# Patient Record
Sex: Female | Born: 1955 | Race: White | Hispanic: No | Marital: Married | Smoking: Current every day smoker
Health system: Southern US, Community
[De-identification: ages and names within clinical notes are randomized; demographics above are authoritative.]

## PROBLEM LIST (undated history)

## (undated) DIAGNOSIS — M722 Plantar fascial fibromatosis: Secondary | ICD-10-CM

## (undated) DIAGNOSIS — R519 Headache, unspecified: Secondary | ICD-10-CM

## (undated) DIAGNOSIS — F419 Anxiety disorder, unspecified: Secondary | ICD-10-CM

## (undated) DIAGNOSIS — R51 Headache: Secondary | ICD-10-CM

## (undated) DIAGNOSIS — M76899 Other specified enthesopathies of unspecified lower limb, excluding foot: Secondary | ICD-10-CM

## (undated) DIAGNOSIS — R002 Palpitations: Secondary | ICD-10-CM

## (undated) DIAGNOSIS — E119 Type 2 diabetes mellitus without complications: Secondary | ICD-10-CM

## (undated) DIAGNOSIS — I2510 Atherosclerotic heart disease of native coronary artery without angina pectoris: Secondary | ICD-10-CM

## (undated) DIAGNOSIS — F17200 Nicotine dependence, unspecified, uncomplicated: Secondary | ICD-10-CM

## (undated) DIAGNOSIS — M1990 Unspecified osteoarthritis, unspecified site: Secondary | ICD-10-CM

## (undated) DIAGNOSIS — Z8673 Personal history of transient ischemic attack (TIA), and cerebral infarction without residual deficits: Secondary | ICD-10-CM

## (undated) DIAGNOSIS — I639 Cerebral infarction, unspecified: Secondary | ICD-10-CM

## (undated) DIAGNOSIS — E785 Hyperlipidemia, unspecified: Secondary | ICD-10-CM

## (undated) DIAGNOSIS — G4700 Insomnia, unspecified: Secondary | ICD-10-CM

## (undated) DIAGNOSIS — I480 Paroxysmal atrial fibrillation: Secondary | ICD-10-CM

## (undated) DIAGNOSIS — I10 Essential (primary) hypertension: Secondary | ICD-10-CM

## (undated) DIAGNOSIS — J45909 Unspecified asthma, uncomplicated: Secondary | ICD-10-CM

## (undated) DIAGNOSIS — J449 Chronic obstructive pulmonary disease, unspecified: Secondary | ICD-10-CM

## (undated) DIAGNOSIS — I509 Heart failure, unspecified: Secondary | ICD-10-CM

## (undated) DIAGNOSIS — D1739 Benign lipomatous neoplasm of skin and subcutaneous tissue of other sites: Secondary | ICD-10-CM

## (undated) DIAGNOSIS — R809 Proteinuria, unspecified: Secondary | ICD-10-CM

## (undated) DIAGNOSIS — I6529 Occlusion and stenosis of unspecified carotid artery: Secondary | ICD-10-CM

## (undated) DIAGNOSIS — G4730 Sleep apnea, unspecified: Secondary | ICD-10-CM

## (undated) DIAGNOSIS — D72829 Elevated white blood cell count, unspecified: Secondary | ICD-10-CM

## (undated) DIAGNOSIS — H9319 Tinnitus, unspecified ear: Secondary | ICD-10-CM

## (undated) HISTORY — DX: Insomnia, unspecified: G47.00

## (undated) HISTORY — DX: Other specified enthesopathies of unspecified lower limb, excluding foot: M76.899

## (undated) HISTORY — DX: Occlusion and stenosis of unspecified carotid artery: I65.29

## (undated) HISTORY — DX: Proteinuria, unspecified: R80.9

## (undated) HISTORY — PX: APPENDECTOMY: SHX54

## (undated) HISTORY — DX: Hyperlipidemia, unspecified: E78.5

## (undated) HISTORY — DX: Elevated white blood cell count, unspecified: D72.829

## (undated) HISTORY — DX: Atherosclerotic heart disease of native coronary artery without angina pectoris: I25.10

## (undated) HISTORY — DX: Unspecified osteoarthritis, unspecified site: M19.90

## (undated) HISTORY — DX: Chronic obstructive pulmonary disease, unspecified: J44.9

## (undated) HISTORY — DX: Paroxysmal atrial fibrillation: I48.0

## (undated) HISTORY — DX: Essential (primary) hypertension: I10

## (undated) HISTORY — DX: Personal history of transient ischemic attack (TIA), and cerebral infarction without residual deficits: Z86.73

## (undated) HISTORY — DX: Anxiety disorder, unspecified: F41.9

## (undated) HISTORY — DX: Sleep apnea, unspecified: G47.30

## (undated) HISTORY — PX: OTHER SURGICAL HISTORY: SHX169

## (undated) HISTORY — PX: ABDOMINAL HYSTERECTOMY: SHX81

## (undated) HISTORY — DX: Palpitations: R00.2

## (undated) HISTORY — PX: MOLE REMOVAL: SHX2046

## (undated) HISTORY — DX: Benign lipomatous neoplasm of skin and subcutaneous tissue of other sites: D17.39

## (undated) HISTORY — DX: Type 2 diabetes mellitus without complications: E11.9

## (undated) HISTORY — DX: Nicotine dependence, unspecified, uncomplicated: F17.200

## (undated) HISTORY — DX: Plantar fascial fibromatosis: M72.2

## (undated) HISTORY — DX: Tinnitus, unspecified ear: H93.19

---

## 2004-11-08 ENCOUNTER — Emergency Department: Payer: Self-pay | Admitting: Emergency Medicine

## 2004-11-08 ENCOUNTER — Other Ambulatory Visit: Payer: Self-pay

## 2006-03-02 ENCOUNTER — Ambulatory Visit: Payer: Self-pay | Admitting: Internal Medicine

## 2006-05-22 ENCOUNTER — Ambulatory Visit: Payer: Self-pay | Admitting: Internal Medicine

## 2006-10-07 ENCOUNTER — Emergency Department: Payer: Self-pay | Admitting: Emergency Medicine

## 2007-04-24 HISTORY — PX: CARDIAC CATHETERIZATION: SHX172

## 2007-07-16 ENCOUNTER — Ambulatory Visit: Payer: Self-pay | Admitting: Internal Medicine

## 2007-09-28 ENCOUNTER — Inpatient Hospital Stay: Payer: Self-pay | Admitting: Cardiology

## 2007-09-28 ENCOUNTER — Other Ambulatory Visit: Payer: Self-pay

## 2007-09-30 ENCOUNTER — Other Ambulatory Visit: Payer: Self-pay

## 2007-11-03 ENCOUNTER — Emergency Department: Payer: Self-pay | Admitting: Emergency Medicine

## 2007-11-03 ENCOUNTER — Other Ambulatory Visit: Payer: Self-pay

## 2007-11-19 ENCOUNTER — Encounter: Payer: Self-pay | Admitting: Internal Medicine

## 2008-02-16 ENCOUNTER — Encounter: Payer: Self-pay | Admitting: Cardiovascular Disease

## 2008-12-31 ENCOUNTER — Emergency Department: Payer: Self-pay | Admitting: Emergency Medicine

## 2009-01-11 ENCOUNTER — Encounter: Payer: Self-pay | Admitting: Cardiovascular Disease

## 2009-04-30 ENCOUNTER — Inpatient Hospital Stay: Payer: Self-pay | Admitting: *Deleted

## 2009-04-30 ENCOUNTER — Encounter: Payer: Self-pay | Admitting: Cardiovascular Disease

## 2009-05-09 ENCOUNTER — Encounter: Payer: Self-pay | Admitting: Family Medicine

## 2009-05-23 ENCOUNTER — Ambulatory Visit: Payer: Self-pay | Admitting: Family Medicine

## 2009-05-24 ENCOUNTER — Encounter: Payer: Self-pay | Admitting: Family Medicine

## 2009-06-23 ENCOUNTER — Ambulatory Visit: Payer: Self-pay | Admitting: Family Medicine

## 2009-07-04 ENCOUNTER — Ambulatory Visit: Payer: Self-pay | Admitting: Family Medicine

## 2009-07-24 ENCOUNTER — Emergency Department: Payer: Self-pay | Admitting: Internal Medicine

## 2009-07-28 ENCOUNTER — Encounter: Payer: Self-pay | Admitting: Cardiovascular Disease

## 2009-07-28 LAB — CONVERTED CEMR LAB
ALT: 18 U/L
AST: 17 U/L
Albumin: 4.2 g/dL
Alkaline Phosphatase: 97 U/L
BUN: 9 mg/dL
Basophils Relative: 1 %
CO2: 22 meq/L
Calcium: 9.4 mg/dL
Chloride: 102 meq/L
Cholesterol: 146 mg/dL
Creatinine, Ser: 0.84 mg/dL
Eosinophils Relative: 2 %
Glucose, Bld: 88 mg/dL
HCT: 47 %
HDL: 36 mg/dL
Hemoglobin: 16.4 g/dL
Hgb A1c MFr Bld: 6 %
LDL (calc): 82 mg/dL
Lymphocytes, automated: 38 %
MCV: 98 fL
Monocytes Relative: 6 %
Neutrophils Relative %: 53 %
Platelets: 222 K/uL
Potassium: 4 meq/L
RBC: 4.81 M/uL
RDW: 13 %
Sodium: 140 meq/L
Total Bilirubin: 0.3 mg/dL
Total Protein: 7.4 g/dL
WBC: 12.2 10*3/microliter

## 2009-08-16 ENCOUNTER — Encounter: Payer: Self-pay | Admitting: Cardiovascular Disease

## 2009-08-19 ENCOUNTER — Ambulatory Visit: Payer: Self-pay | Admitting: Podiatrist

## 2009-10-12 ENCOUNTER — Encounter: Payer: Self-pay | Admitting: Cardiovascular Disease

## 2009-10-19 ENCOUNTER — Encounter: Payer: Self-pay | Admitting: Cardiovascular Disease

## 2009-10-25 ENCOUNTER — Encounter: Payer: Self-pay | Admitting: Cardiovascular Disease

## 2009-10-28 ENCOUNTER — Ambulatory Visit: Payer: Self-pay | Admitting: Cardiovascular Disease

## 2009-10-28 DIAGNOSIS — R002 Palpitations: Secondary | ICD-10-CM | POA: Insufficient documentation

## 2009-10-28 DIAGNOSIS — I209 Angina pectoris, unspecified: Secondary | ICD-10-CM | POA: Insufficient documentation

## 2009-10-28 DIAGNOSIS — I2510 Atherosclerotic heart disease of native coronary artery without angina pectoris: Secondary | ICD-10-CM | POA: Insufficient documentation

## 2009-10-28 DIAGNOSIS — R0602 Shortness of breath: Secondary | ICD-10-CM | POA: Insufficient documentation

## 2009-10-28 DIAGNOSIS — E785 Hyperlipidemia, unspecified: Secondary | ICD-10-CM | POA: Insufficient documentation

## 2009-11-02 ENCOUNTER — Telehealth: Payer: Self-pay | Admitting: Cardiovascular Disease

## 2009-11-09 ENCOUNTER — Encounter: Payer: Self-pay | Admitting: Cardiovascular Disease

## 2009-12-02 ENCOUNTER — Telehealth: Payer: Self-pay | Admitting: Cardiovascular Disease

## 2009-12-27 ENCOUNTER — Ambulatory Visit: Payer: Self-pay

## 2010-01-02 ENCOUNTER — Inpatient Hospital Stay: Payer: Self-pay | Admitting: Internal Medicine

## 2010-01-02 ENCOUNTER — Telehealth: Payer: Self-pay | Admitting: Cardiovascular Disease

## 2010-01-02 ENCOUNTER — Ambulatory Visit: Payer: Self-pay | Admitting: Cardiovascular Disease

## 2010-01-03 ENCOUNTER — Encounter: Payer: Self-pay | Admitting: Cardiovascular Disease

## 2010-02-06 ENCOUNTER — Telehealth: Payer: Self-pay | Admitting: Cardiovascular Disease

## 2010-02-24 ENCOUNTER — Encounter: Payer: Self-pay | Admitting: Cardiovascular Disease

## 2010-03-13 ENCOUNTER — Ambulatory Visit: Payer: Self-pay | Admitting: Cardiovascular Disease

## 2010-03-14 ENCOUNTER — Encounter: Payer: Self-pay | Admitting: Cardiovascular Disease

## 2010-03-14 ENCOUNTER — Ambulatory Visit: Payer: Self-pay

## 2010-05-23 NOTE — Letter (Signed)
Summary: [place] Medicaid Preferred Drug List  [place] Medicaid Preferred Drug List   Imported By: Marylou Mccoy 03/21/2010 15:19:48  _____________________________________________________________________  External Attachment:    Type:   Image     Comment:   External Document

## 2010-05-23 NOTE — Medication Information (Signed)
Summary: Tax adviser   Imported By: Harlon Flor 03/08/2010 12:14:48  _____________________________________________________________________  External Attachment:    Type:   Image     Comment:   External Document

## 2010-05-23 NOTE — Progress Notes (Signed)
Summary: Medication [facility])  Phone Note Call from Patient Call back at [facility] Phone [phone]   Caller: Patient Call For: [facility] Summary of Call: Patient called about her medication that she started after her visit and she is stil having headaches and naseuated.  Would like to know how long it will take for these side effects to subside. Initial call taken by: [place] Car[place],  November 02, 2009 10:38 AM  Follow-up for Phone Call        I called and spoke with the pt. She states that starting saturday, she developed breathing problems and started on oxygen. She did go ahead and drop her bisoprolol to 10mg  once daily after that. She has developed nausea and headaches. She has drunk quite a bit of fluid today. I explained to the pt that she may be dehydrated. I asked if she were diabetic and she states she is not. Per Dr. Mariah Milling, the pt may hold her bisoprolol for a couple of days to see if her symptoms resolve. I will call her back on friday to f/u with how she is doing. I explained if her symptoms get worse off of the bisoprolol, she should contact her PCP. She is agreeable. Follow-up by: Sherri Rad, RN, BSN,  November 02, 2009 12:19 PM  Additional Follow-up for Phone Call Additional follow up Details #1::        [facility]. Sherri Rad, RN, BSN  November 04, 2009 12:15 PM   Pt resports no further s/s since d/c bisoprolol. please advise. Benedict Needy, RN  November 08, 2009 9:41 AM     Additional Follow-up for Phone Call Additional follow up Details #2::    If she feels better and still has palpitations, could try some bystolic 5 mg daily. Come by for samples. Hold metoprolol if on metoprolol. May need bystolic 10 mg if 5 mg not enough.   Appended Document: Medication [facility]) spoke to pt about medication change will leave samples of bystolic at front desk

## 2010-05-23 NOTE — Miscellaneous (Signed)
Summary: Bystolic samples  Clinical Lists Changes  Medications: Removed medication of BISOPROLOL FUMARATE 10 MG TABS (BISOPROLOL FUMARATE) Take 2 tablet by mouth once a day Added new medication of BYSTOLIC 5 MG TABS (NEBIVOLOL HCL) Take 1 tablet by mouth once a day - Signed Rx of BYSTOLIC 5 MG TABS (NEBIVOLOL HCL) Take 1 tablet by mouth once a day;  #28 x 0;  Signed;  Entered by: Benedict Needy, RN;  Authorized by: Dossie Arbour MD;  Method used: Samples Given    Prescriptions: BYSTOLIC 5 MG TABS (NEBIVOLOL HCL) Take 1 tablet by mouth once a day  #28 x 0   Entered by:   Benedict Needy, RN   Authorized by:   Dossie Arbour MD   Signed by:   Benedict Needy, RN on 11/09/2009   Method used:   Samples Given   RxID:   [id number]

## 2010-05-23 NOTE — Cardiovascular Report (Signed)
Summary: Cardiac Cath Other  Cardiac Cath Other   Imported By: [place] Carbo 10/28/2009 12:03:16  _____________________________________________________________________  External Attachment:    Type:   Image     Comment:   External Document

## 2010-05-23 NOTE — Progress Notes (Signed)
Summary: FYI-ED  Phone Note Call from Patient Call back at Home Phone [phone]   Caller: Daughter Call For: [facility] Summary of Call: WANTED TO INFORM us THAT PT IS HAVING CHEST PAIN AND IS GOING TO THE ED Initial call taken by: Harlon Flor,  January 02, 2010 9:10 AM

## 2010-05-23 NOTE — Letter (Signed)
[facility]   Imported By: Park Breed 10/31/2009 08:50:31  _____________________________________________________________________  External Attachment:    Type:   Image     Comment:   External Document

## 2010-05-23 NOTE — Progress Notes (Signed)
Summary: SAMPLES  Phone Note Refill Request Call back at Home Phone [phone] Message from:  Patient on February 06, 2010 11:30 AM  Refills Requested: Medication #1:  BYSTOLIC 5 MG TABS Take 1 tablet by mouth once a day. PT WOULD LIKE SAMPLES OF BYSTOLLIC  Initial call taken by: Harlon Flor,  February 06, 2010 11:30 AM  Follow-up for Phone Call        Samples given of Bystolic 5mg  x 2 month supply. Follow-up by: Bishop Dublin, CMA,  February 06, 2010 3:17 PM

## 2010-05-23 NOTE — Assessment & Plan Note (Signed)
Summary: F/U [facility]   Visit Type:  Follow-up Primary Provider:  Anastasia Pall.  CC:  F/U [facility].  Has chest discomfort when lying down and is short of breath during the day.Marland Kitchen  History of Present Illness: 55 year old woman with a long history of smoking for the past 30 years, per her report, history of TIAs, underlying coronary artery disease with a PCI to her proximal RCA in June of 2009 with ejection fraction at that time of 60% with no disease mentioned to her left coronary system, diabetes, COPD, insomnia, chronic low back pain who presents for routine follow up.  She reports that the palpitations are better on the bystolic. She does have them at times.  She still has some chest pain. this comes on in bed when lying o her side, sometimes with mopping and sometimes with exertion, walking.   She was recently seen in the hospital for chest pain 01/02/2010. Stress test was performed that showed no signiificant ischemia.   Chronis insomnia is still a problem.  EKG shows normal sinus rhythm with rate 69 beats per minute, no significant ST or T wave changes.  Current Medications (verified): 1)  Aspirin 81 Mg Tbec (Aspirin) .Marland Kitchen.. 1 Once Daily 2)  Plavix 75 Mg Tabs (Clopidogrel Bisulfate) .Marland Kitchen.. 1 Tab Once Daily 3)  Crestor 20 Mg Tabs (Rosuvastatin Calcium) .... One Tablet At Bedtime 4)  Xanax 0.5 Mg Tabs (Alprazolam) .Marland Kitchen.. 1 Once Daily 5)  Nitrostat 0.4 Mg Subl (Nitroglycerin) .... As Needed 6)  Bystolic 5 Mg Tabs (Nebivolol Hcl) .... Take 1 Tablet By Mouth Once A Day 7)  Albuterol Sulfate (2.5 Mg/108ml) 0.083% Nebu (Albuterol Sulfate) .... As Needed  Allergies (verified): 1)  ! Augmentin 2)  ! * Cefzil 3)  ! * Cheese Flavor 4)  ! * Cherry 5)  ! * Egg 6)  ! * Nexium 7)  ! Penicillin 8)  ! Prednisone 9)  ! Prilosec 10)  ! * Propulsid 11)  ! * Singulair 12)  ! * Tequin 13)  ! Zithromax  Past History:  Past Medical History: Last updated: 10/28/2009 COPD   anxiety palpitation TIA carotid stenosis CAD dyslipidemia  Past Surgical History: Last updated: 10/25/2009 thoractomy hysterectomy appendectomy stent placement   Family History: Last updated: 10/25/2009 Family History of Coronary Artery Disease:  Family History of CVA or Stroke:  Family History of Diabetes:   Social History: Last updated: 10/25/2009 Disabled  Divorced  Tobacco Use - Yes. 1/2 ppd Alcohol Use - no Regular Exercise - no Drug Use - no  Risk Factors: Exercise: no (10/25/2009)  Risk Factors: Smoking Status: current (10/25/2009)  Review of Systems       The patient complains of chest pain.  The patient denies fever, vision loss, decreased hearing, hoarseness, syncope, dyspnea on exertion, peripheral edema, prolonged cough, abdominal pain, incontinence, muscle weakness, depression, and enlarged lymph nodes.    Vital Signs:  Patient profile:   55 year old female Height:      60 inches Weight:      203 pounds BMI:     39.79 Pulse rate:   69 / minute BP sitting:   110 / 68  (left arm) Cuff size:   large  Vitals Entered By: Bishop Dublin, CMA (March 13, 2010 10:49 AM)  Physical Exam  General:  Well developed, well nourished, in no acute distress.she smells of smoke. Head:  normocephalic and atraumatic Neck:  Neck supple, no JVD. No masses, thyromegaly or abnormal cervical nodes. Lungs:  Clear bilaterally to auscultation and percussion. Heart:  Non-displaced PMI, chest non-tender; regular rate and rhythm, S1, S2 without murmurs, rubs or gallops. Carotid upstroke normal, no bruit.  Pedals normal pulses. No edema, no varicosities. Abdomen:  Bowel sounds positive; abdomen soft and non-tender without masses Msk:  Back normal, normal gait. Muscle strength and tone normal. Pulses:  pulses normal in all 4 extremities Extremities:  No clubbing or cyanosis. Neurologic:  Alert and oriented x 3. Skin:  Intact without lesions or rashes. Psych:  Normal  affect.   Impression & Recommendations:  Problem # 1:  CHEST PAIN UNSPECIFIED (ICD-786.50) we have offered her cardiac catheterization, she prefers medical management at this time. We will start her on Imdur 15 mg daily, titrating up to 30 mg daily if tolerated. Alternatively, we could try ranexa for her symptoms. A vascular contact me if she has continued symptoms on long-acting nitrates  Her updated medication list for this problem includes:    Aspirin 81 Mg Tbec (Aspirin) .Marland Kitchen... 1 once daily    Plavix 75 Mg Tabs (Clopidogrel bisulfate) .Marland Kitchen... 1 tab once daily    Nitrostat 0.4 Mg Subl (Nitroglycerin) .Marland Kitchen... As needed    Bystolic 5 Mg Tabs (Nebivolol hcl) .Marland Kitchen... Take 1 tablet by mouth once a day    Isosorbide Mononitrate Cr 30 Mg Xr24h-tab (Isosorbide mononitrate) .Marland Kitchen... Take one tablet by mouth daily  Orders: EKG w/ Interpretation (93000)  Problem # 2:  PALPITATIONS (ICD-785.1) Symptoms are better on low dose beta blocker. She can take an additional dose p.r.n. for severe symptoms.  Her updated medication list for this problem includes:    Aspirin 81 Mg Tbec (Aspirin) .Marland Kitchen... 1 once daily    Plavix 75 Mg Tabs (Clopidogrel bisulfate) .Marland Kitchen... 1 tab once daily    Nitrostat 0.4 Mg Subl (Nitroglycerin) .Marland Kitchen... As needed    Bystolic 5 Mg Tabs (Nebivolol hcl) .Marland Kitchen... Take 1 tablet by mouth once a day    Isosorbide Mononitrate Cr 30 Mg Xr24h-tab (Isosorbide mononitrate) .Marland Kitchen... Take one tablet by mouth daily  Problem # 3:  CAD (ICD-414.00) Will continue aggressive lipid management. No testing at this time.  Her updated medication list for this problem includes:    Aspirin 81 Mg Tbec (Aspirin) .Marland Kitchen... 1 once daily    Plavix 75 Mg Tabs (Clopidogrel bisulfate) .Marland Kitchen... 1 tab once daily    Nitrostat 0.4 Mg Subl (Nitroglycerin) .Marland Kitchen... As needed    Bystolic 5 Mg Tabs (Nebivolol hcl) .Marland Kitchen... Take 1 tablet by mouth once a day    Isosorbide Mononitrate Cr 30 Mg Xr24h-tab (Isosorbide mononitrate) .Marland Kitchen... Take one  tablet by mouth daily  Orders: EKG w/ Interpretation (93000)  Problem # 4:  HYPERLIPIDEMIA-MIXED (ICD-272.4) goal LDL is less than 70.  Her updated medication list for this problem includes:    Crestor 20 Mg Tabs (Rosuvastatin calcium) ..... One tablet at bedtime  Other Orders: Carotid Duplex (Carotid Duplex)  Patient Instructions: 1)  Your physician recommends that you schedule a follow-up appointment in: 6 months 2)  Your physician has recommended you make the following change in your medication: Start taking Isosorbide Mononitrate 30mg  1/2 tablet daily and advance as tolerated to 1 tablet daily.  3)  Your physician has requested that you have a carotid duplex. This test is an ultrasound of the carotid arteries in your neck. It looks at blood flow through these arteries that supply the brain with blood. Allow one hour for this exam. There are no restrictions or special instructions. Prescriptions: PLAVIX  75 MG TABS (CLOPIDOGREL BISULFATE) 1 tab once daily  #30 x 6   Entered by:   Cloyde Reams RN   Authorized by:   Dossie Arbour MD   Signed by:   Cloyde Reams RN on 03/13/2010   Method used:   Electronically to        [facility] [address]* (retail)       [address]       [place], [place]  [address]       Ph: [phone]       Fax: [phone]   RxID:   [id number] ISOSORBIDE MONONITRATE CR 30 MG XR24H-TAB (ISOSORBIDE MONONITRATE) Take one tablet by mouth daily  #30 x 6   Entered by:   Cloyde Reams RN   Authorized by:   Dossie Arbour MD   Signed by:   Cloyde Reams RN on 03/13/2010   Method used:   Electronically to        [facility] [address]* (retail)       [address]       [place], [place]  [address]       Ph: [phone]       Fax: [phone]   RxID:   [id number]

## 2010-05-23 NOTE — Progress Notes (Signed)
Summary: RX bystolic  Phone Note Refill Request Call back at Home Phone [phone] Message from:  Patient on December 02, 2009 8:28 AM  Refills Requested: Medication #1:  BYSTOLIC 5 MG TABS Take 1 tablet by mouth once a day. [facility] [address]  Initial call taken by: Harlon Flor,  December 02, 2009 8:29 AM    Prescriptions: BYSTOLIC 5 MG TABS (NEBIVOLOL HCL) Take 1 tablet by mouth once a day  #30 x 6   Entered by:   Bishop Dublin, CMA   Authorized by:   Dossie Arbour MD   Signed by:   Bishop Dublin, CMA on 12/02/2009   Method used:   Electronically to        [facility] [address]* (retail)       [address]       [place], [place]  [address]       Ph: [phone]       Fax: [phone]   RxID:   [id number]

## 2010-05-23 NOTE — Letter (Signed)
Summary: [facility] [place] [facility]  [facility]   Imported By: Marylou Mccoy 03/27/2010 13:33:23  _____________________________________________________________________  External Attachment:    Type:   Image     Comment:   External Document

## 2010-05-23 NOTE — Letter (Signed)
[facility]   Imported By: Harlon Flor 01/09/2010 12:47:12  _____________________________________________________________________  External Attachment:    Type:   Image     Comment:   External Document

## 2010-05-23 NOTE — Letter (Signed)
Summary: record release  record release   Imported By: Frazier Butt Chriscoe 10/19/2009 12:11:21  _____________________________________________________________________  External Attachment:    Type:   Image     Comment:   External Document

## 2010-05-23 NOTE — Letter (Signed)
Summary: Historic Patient File  Historic Patient File   Imported By: [place] Carbo 10/28/2009 12:03:38  _____________________________________________________________________  External Attachment:    Type:   Image     Comment:   External Document

## 2010-05-23 NOTE — Assessment & Plan Note (Signed)
Summary: NEW PT   Visit Type:  Initial Consult Primary Provider:  Anastasia Pall.  CC:  c/o rapid irreg. heart beats with shortness of breath and chest pain. Stent placed 2009 at [facility] by Dr. Darrold Junker..  History of Present Illness: 55 year old woman with a long history of smoking for the past 30 years, per her report, history of TIAs, underlying coronary artery disease with a PCI to her proximal RCA in June of 2009 with ejection fraction at that time of 60% with no disease mentioned to her left coronary system, diabetes, COPD, insomnia, chronic low back pain who presents for evaluation for recent episodes of palpitations.  She has numerous complaints today, most important to her is the "thumping "that she feels. She describes it as palpitations though more accurately as a thumping. She has this sometimes every other day, sometimes several times a day, sometimes at nighttime and sometimes during the daytime. They are brief in nature though she finds the symptoms very uncomfortable. Notes indicate that previously she was tried on Cardizem and carvedilol as she did not tolerate these well and she appreciated the palpitations more and had lower extremity edema. She has been on metoprolol with some improvement though his continued to be bothered by the symptoms.  She denies any significant chest discomfort like she had prior to her stent. She described her symptoms previously as having sweaty palms, "irritating chest pain that was constant". She does not have these symptoms currently.  Her other regular shoe is insomnia. She does have good relief and sleep with Xanax though she does not have enough to take it every day. She's tried other sleep aids though she reports that this gives her bad dreams. She's not found an alternate medication that seems to work for her. She sleeps approximately 3 hours per night and she believes this is contributing to her other symptoms such as leg discomfort, dizziness,  miscellaneous aches and pains and headaches.  EKG shows normal sinus rhythm with rate 69 beats per minute, no significant ST or T wave changes.  Current Medications (verified): 1)  Aspirin 81 Mg Tbec (Aspirin) .Marland Kitchen.. 1 Once Daily 2)  Plavix 75 Mg Tabs (Clopidogrel Bisulfate) .Marland Kitchen.. 1 Tab Once Daily 3)  Simvastatin 40 Mg Tabs (Simvastatin) .Marland Kitchen.. 1 Once Daily 4)  Chantix 1 Mg Tabs (Varenicline Tartrate) .Marland Kitchen.. 1 Two Times A Day 5)  Xanax 0.5 Mg Tabs (Alprazolam) .Marland Kitchen.. 1 Once Daily 6)  Nitrostat 0.4 Mg Subl (Nitroglycerin) .... As Needed  Allergies (verified): 1)  ! Augmentin 2)  ! * Cefzil 3)  ! * Cheese Flavor 4)  ! * Cherry 5)  ! * Egg 6)  ! * Nexium 7)  ! Penicillin 8)  ! Prednisone 9)  ! Prilosec 10)  ! * Propulsid 11)  ! * Singulair 12)  ! * Tequin 13)  ! Zithromax  Past History:  Past Medical History: COPD  anxiety palpitation TIA carotid stenosis CAD dyslipidemia  Review of Systems  The patient denies fever, weight loss, weight gain, vision loss, decreased hearing, hoarseness, chest pain, syncope, dyspnea on exertion, peripheral edema, prolonged cough, abdominal pain, incontinence, muscle weakness, depression, and enlarged lymph nodes.         palpitations/"thumping"  Vital Signs:  Patient profile:   55 year old female Height:      60 inches Weight:      207 pounds BMI:     40.57 Pulse rate:   79 / minute BP sitting:   139 /  93  (left arm) Cuff size:   large  Vitals Entered By: Bishop Dublin, CMA (October 28, 2009 10:57 AM)  Physical Exam  General:  Well developed, well nourished, in no acute distress.she smells of smoke. Head:  normocephalic and atraumatic Neck:  Neck supple, no JVD. No masses, thyromegaly or abnormal cervical nodes. Chest Wall:  no deformities or breast masses noted Lungs:  Clear bilaterally to auscultation and percussion. Heart:  Non-displaced PMI, chest non-tender; regular rate and rhythm, S1, S2 without murmurs, rubs or gallops. Carotid  upstroke normal, no bruit.  Pedals normal pulses. No edema, no varicosities. Abdomen:  Bowel sounds positive; abdomen soft and non-tender without masses Msk:  Back normal, normal gait. Muscle strength and tone normal. Pulses:  pulses normal in all 4 extremities Extremities:  No clubbing or cyanosis. Neurologic:  Alert and oriented x 3. Skin:  Intact without lesions or rashes. Psych:  Normal affect.   Impression & Recommendations:  Problem # 1:  PALPITATIONS (ICD-785.1) long history of palpitations, she estimates for more than 10 years. I suspect she is having either VPCs were APCs. Beta blockers is usually the typical medication we use to try to improve the symptoms. She has tried Corag, no metoprolol. Try an alternate beta blocker, bisoprolol, to see if this helps better than the metoprolol. She could try 10 mg b.i.d. or 20 mg daily. If this makes her dizzy, we have suggested she decrease the dose to 10 mg daily. I suspect that we will not be able to eliminate her symptoms are potentially make them better. I do not believe that these are a sign of worsening angina or coronary disease. She is otherwise active with no complaints.  The following medications were removed from the medication list:    Carvedilol 6.25 Mg Tabs (Carvedilol) .Marland Kitchen... 1 two times a day    Diltiazem Hcl Cr 120 Mg Xr24h-cap (Diltiazem hcl) .Marland Kitchen... 1 once daily Her updated medication list for this problem includes:    Aspirin 81 Mg Tbec (Aspirin) .Marland Kitchen... 1 once daily    Plavix 75 Mg Tabs (Clopidogrel bisulfate) .Marland Kitchen... 1 tab once daily    Nitrostat 0.4 Mg Subl (Nitroglycerin) .Marland Kitchen... As needed    Bisoprolol Fumarate 10 Mg Tabs (Bisoprolol fumarate) .Marland Kitchen... Take 2 tablet by mouth once a day  Problem # 2:  CAD (ICD-414.00) She does have a history of underlying coronary disease, she is obese, with diabetes and has not stopped smoking. We have talked her about her smoking and her response was that if we can fix everything else then  she might be able to stop smoking. I think she has some underlying anxiety and poor sleep.  Potentially there might be an alternative medication she could take for sleep.  we did mention that she had symptoms concerning for angina, we could perform a pharmacologic stress test at [facility]. We will hold off on any testing at this time as she feels her main symptoms are insomnia.  The following medications were removed from the medication list:    Carvedilol 6.25 Mg Tabs (Carvedilol) .Marland Kitchen... 1 two times a day    Diltiazem Hcl Cr 120 Mg Xr24h-cap (Diltiazem hcl) .Marland Kitchen... 1 once daily Her updated medication list for this problem includes:    Aspirin 81 Mg Tbec (Aspirin) .Marland Kitchen... 1 once daily    Plavix 75 Mg Tabs (Clopidogrel bisulfate) .Marland Kitchen... 1 tab once daily    Nitrostat 0.4 Mg Subl (Nitroglycerin) .Marland Kitchen... As needed    Bisoprolol Fumarate 10 Mg  Tabs (Bisoprolol fumarate) .Marland Kitchen... Take 2 tablet by mouth once a day  Orders: EKG w/ Interpretation ([address])  Problem # 3:  HYPERLIPIDEMIA-MIXED (ICD-272.4) cholesterol is reasonably well controlled on her simvastatin 40 mg daily.  Her updated medication list for this problem includes:    Simvastatin 40 Mg Tabs (Simvastatin) .Marland Kitchen... 1 once daily  Patient Instructions: 1)  Your physician has recommended you make the following change in your medication:  STOP metoprolol START bisoprolol 20mg   2)  Your physician wants you to follow-up in:   3-6 months You will receive a reminder letter in the mail two months in advance. If you don't receive a letter, please call our office to schedule the follow-up appointment. Prescriptions: BISOPROLOL FUMARATE 10 MG TABS (BISOPROLOL FUMARATE) Take 2 tablet by mouth once a day  #60 x 6   Entered by:   Benedict Needy, RN   Authorized by:   Dossie Arbour MD   Signed by:   Benedict Needy, RN on 10/28/2009   Method used:   Electronically to        [facility] [address] (retail)       [address]       [place],  [place]  [address]       Ph: [phone]       Fax: [phone]   RxID:   [id number]

## 2010-05-23 NOTE — Letter (Signed)
Summary: record release  record release   Imported By: Frazier Butt Chriscoe 10/19/2009 12:12:07  _____________________________________________________________________  External Attachment:    Type:   Image     Comment:   External Document

## 2010-06-26 ENCOUNTER — Encounter: Payer: Self-pay | Admitting: Family Medicine

## 2010-06-27 ENCOUNTER — Inpatient Hospital Stay: Payer: Self-pay | Admitting: Internal Medicine

## 2010-06-28 DIAGNOSIS — I2510 Atherosclerotic heart disease of native coronary artery without angina pectoris: Secondary | ICD-10-CM

## 2010-06-29 ENCOUNTER — Telehealth: Payer: Self-pay | Admitting: Internal Medicine

## 2010-07-04 ENCOUNTER — Encounter: Payer: Self-pay | Admitting: Internal Medicine

## 2010-07-11 NOTE — Letter (Signed)
Summary: Work Writer at [employer]. [address]   [place], [place] [address]   Phone: [phone]  Fax: [phone]     July 04, 2010    To whom it may concern:  Please excuse Barbara Bowen from work on 3/6 to 3/7 due to her mother being in hospital.  Please take this into consideration when reviewing the time away from work     Sincerely yours,    Dr. Arvilla Meres

## 2010-07-11 NOTE — Progress Notes (Signed)
Summary: Leave of absence letter for work  Phone Note Call from Patient Call back at [phone]   Caller: Daughter Call For: Barbara Bowen Summary of Call: Pt's daughter came by office today and [facility] sent her over here to get a leave of absence letter for work. Pt's daughter's name is Audiological scientist. Thanks, Huntley Dec Initial call taken by: Lysbeth Galas CMA,  June 29, 2010 2:26 PM  Follow-up for Phone Call        ok to give her an excuse just for the days the patient was in the hospital. Dolores Patty, MD, [facility]  July 02, 2010 8:19 PM   Additional Follow-up for Phone Call Additional follow up Details #1::        Notified [place] letter is up front for her to pick up/ Lysbeth Galas CMA  July 04, 2010 3:31 PM

## 2010-07-19 ENCOUNTER — Encounter: Payer: Self-pay | Admitting: Cardiovascular Disease

## 2010-11-06 ENCOUNTER — Telehealth: Payer: Self-pay | Admitting: Cardiovascular Disease

## 2010-11-06 NOTE — Telephone Encounter (Signed)
Patient was on oxygen as needed.  [employer] did a pulse ox and decided that the patient did not need the oxygen.  The patients notes that there was relief with her symptoms of palpitations when using the oxygen and would like to get an order to keep the oxygen.

## 2010-11-07 NOTE — Telephone Encounter (Signed)
Spoke to pt, she was last seen in office 02/2010 by TG. She says her palpitations less frequent when she was on oxygen, but O2 d/c due to incr pulse ox by pcp. I notified pt that we do not normally send orders for O2, done by pcp or pulmonary. Pt is aware of this and is asking since she wants for "palpitations." Advised pt she would need f/u with Dr. Mariah Milling for assessment prior to any such order, especially if she is having problems with palpitations. Pt will schedule f/u. She also needs to know if she is to continue Plavix, she had PCI 2009. Notified pt she can discuss with TG at appt as well.

## 2010-11-08 NOTE — Telephone Encounter (Signed)
She will not qualify for oxygen unless her level drops less than 86%. Palpitations will not qualify her for oxygen Would continue plavix now that generic

## 2010-11-09 ENCOUNTER — Encounter: Payer: Self-pay | Admitting: Cardiovascular Disease

## 2010-11-09 NOTE — Telephone Encounter (Signed)
Spoke to pt, she is aware of msg below. She would like to cancel her appt on Monday, which was to discuss palpitations, she is on Bystolic and seems to be working ok so far. She will call back if symptoms persist to make appt.

## 2010-11-14 ENCOUNTER — Ambulatory Visit: Payer: Medicaid Other | Admitting: Cardiovascular Disease

## 2010-11-29 ENCOUNTER — Telehealth: Payer: Self-pay

## 2010-11-29 MED ORDER — CLOPIDOGREL BISULFATE 75 MG PO TABS: 75.0000 mg | ORAL_TABLET | Freq: Every day | ORAL | Status: DC

## 2010-11-29 NOTE — Telephone Encounter (Signed)
Requested a refill for plavix 75 mg take one tablet daily.

## 2010-12-07 ENCOUNTER — Ambulatory Visit: Payer: Self-pay | Admitting: Family Medicine

## 2011-01-26 ENCOUNTER — Other Ambulatory Visit: Payer: Self-pay | Admitting: Cardiovascular Disease

## 2011-01-26 NOTE — Telephone Encounter (Signed)
Refill sent for bystolic 

## 2011-03-09 ENCOUNTER — Telehealth: Payer: Self-pay

## 2011-03-09 NOTE — Telephone Encounter (Signed)
Please verify if patient can take another medication instead of bysolic because her insurance is requiring a prior authorization for the bystolic. I think she has been intolerant of beta blockers in the past. Please advise what to do?

## 2011-03-11 NOTE — Telephone Encounter (Signed)
Can we fill out prior auth.? She has failed other b-blockers and diltiazem

## 2011-03-12 ENCOUNTER — Other Ambulatory Visit: Payer: Self-pay | Admitting: Neurology

## 2011-03-12 DIAGNOSIS — K117 Disturbances of salivary secretion: Secondary | ICD-10-CM

## 2011-03-13 NOTE — Telephone Encounter (Signed)
Prior authorization is approved for one year starting today 03/13/2011 for the bystolic 5 mg take one tablet daily. Spoke with [place] for the [employer]

## 2011-03-19 ENCOUNTER — Ambulatory Visit
Admission: RE | Admit: 2011-03-19 | Discharge: 2011-03-19 | Disposition: A | Discharge status: Home / Self Care | Payer: Medicaid Other | Source: Ambulatory Visit | Attending: Neurology | Admitting: Neurology

## 2011-03-19 DIAGNOSIS — K117 Disturbances of salivary secretion: Secondary | ICD-10-CM

## 2011-03-26 ENCOUNTER — Encounter: Payer: Self-pay | Admitting: Cardiovascular Disease

## 2011-03-27 ENCOUNTER — Encounter: Payer: Self-pay | Admitting: Cardiovascular Disease

## 2011-03-27 ENCOUNTER — Ambulatory Visit (INDEPENDENT_AMBULATORY_CARE_PROVIDER_SITE_OTHER): Payer: Medicaid Other | Admitting: Cardiovascular Disease

## 2011-03-27 DIAGNOSIS — IMO0001 Reserved for inherently not codable concepts without codable children: Secondary | ICD-10-CM

## 2011-03-27 DIAGNOSIS — F17200 Nicotine dependence, unspecified, uncomplicated: Secondary | ICD-10-CM

## 2011-03-27 DIAGNOSIS — E785 Hyperlipidemia, unspecified: Secondary | ICD-10-CM

## 2011-03-27 DIAGNOSIS — R079 Chest pain, unspecified: Secondary | ICD-10-CM

## 2011-03-27 DIAGNOSIS — I2510 Atherosclerotic heart disease of native coronary artery without angina pectoris: Secondary | ICD-10-CM

## 2011-03-27 DIAGNOSIS — R0602 Shortness of breath: Secondary | ICD-10-CM

## 2011-03-27 DIAGNOSIS — R002 Palpitations: Secondary | ICD-10-CM

## 2011-03-27 MED ORDER — NITROGLYCERIN 0.4 MG/SPRAY TL SOLN: 1.0000 | Status: DC | PRN

## 2011-03-27 NOTE — Assessment & Plan Note (Signed)
Currently with no symptoms of angina. No further workup at this time. Continue current medication regimen. 

## 2011-03-27 NOTE — Patient Instructions (Signed)
You are doing well. No medication changes were made.  Try to quit smoking  Please call us if you have new issues that need to be addressed before your next appt.  The office will contact you for a follow up Appt. In 6 months

## 2011-03-27 NOTE — Assessment & Plan Note (Signed)
We'll continue low-dose beta blocker which is working well.

## 2011-03-27 NOTE — Progress Notes (Signed)
Patient ID: Barbara Bowen, female    DOB: September 22, 1955, 55 y.o.   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   She has insomnia, continues to smoke, Though has recently restarted her electronic cigarette. She reports that the palpitations are better on the bystolic.  No recent chest pain though she is asking for a refill for nitroglycerin as the previous prescription has expired.   Chronis insomnia is still a problem.  Cardiac catheterization from February 2012: Left main: Long. Anomalous origin off right cooronary cusp. Appears to run anterior to the aorta  30-40%  LAD:  mild luminal plaqure.. small mid intramyocardial bridge without flow limitation.  LCX:  small. made up primarily of an OM-1. 30-40% ostiall lesion   RCA: Large dominant vessel.Proximal stent widely patent. 40-50% mid after stent. mild plaquing distally.  LV: EF55% no regional wall motion abnormalities Stable CAD with anomalous left main and normal LV function.   EKG shows normal sinus rhythm with rate 76 beats per minute, no significant ST or T wave changes.  Total cholesterol 191, HDL 41, LDL 122, this was prior to starting Crestor      Outpatient Encounter Prescriptions as of 03/27/2011  Medication Sig Dispense Refill  . albuterol (PROAIR HFA) 108 (90 BASE) MCG/ACT inhaler Inhale 2 puffs into the lungs every 6 (six) hours as needed.        . ALPRAZolam (XANAX) 0.5 MG tablet Take 0.5 mg by mouth at bedtime as needed.        Marland Kitchen aspirin 81 MG EC tablet Take 81 mg  by mouth daily.        Marland Kitchen BYSTOLIC 5 MG tablet TAKE ONE TABLET BY MOUTH EVERY DAY  30 each  5  . clopidogrel (PLAVIX) 75 MG tablet Take 1 tablet (75 mg total) by mouth daily.  30 tablet  6  . rosuvastatin (CRESTOR) 5 MG tablet Take 5 mg by mouth daily.        Marland Kitchen tiotropium (SPIRIVA) 18 MCG inhalation capsule Place 18 mcg into inhaler and inhale daily.        . nitroGLYCERIN (NITROLINGUAL) 0.4 MG/SPRAY spray Place 1 spray under the tongue every 5 (five) minutes as needed for chest pain.  12 g  3  . DISCONTD: simvastatin (ZOCOR) 40 MG tablet Take 40 mg by mouth at bedtime.           Review of Systems  Constitutional: Negative.   HENT: Negative.   Eyes: Negative.   Respiratory: Negative.   Cardiovascular: Negative.   Gastrointestinal: Negative.   Musculoskeletal: Negative.   Skin: Negative.   Neurological: Negative.   Hematological: Negative.   Psychiatric/Behavioral: The patient is nervous/anxious.        Insomnia  All other systems reviewed and are negative.    BP 122/72  Pulse 76  Ht 5' 10.5" (1.791 m)  Wt 227 lb 4 oz (103.08 kg)  BMI 32.15 kg/m2   Physical Exam  Nursing note and vitals reviewed. Constitutional: She is oriented to person, place, and time. She  appears well-developed and well-nourished.       obese  HENT:  Head: Normocephalic.  Nose: Nose normal.  Mouth/Throat: Oropharynx is clear and moist.  Eyes: Conjunctivae are normal. Pupils are equal, round, and reactive to light.  Neck: Normal range of motion. Neck supple. No JVD present.  Cardiovascular: Normal rate, regular rhythm, S1 normal, S2 normal and intact distal pulses.  Exam reveals no gallop and no friction rub.   Murmur heard.  Crescendo systolic murmur is present with a grade of 2/6  Pulmonary/Chest: Effort normal and breath sounds normal. No respiratory distress. She has no wheezes. She has no rales. She exhibits no tenderness.  Abdominal: Soft. Bowel sounds are normal. She exhibits no distension.  There is no tenderness.  Musculoskeletal: Normal range of motion. She exhibits edema. She exhibits no tenderness.  Lymphadenopathy:    She has no cervical adenopathy.  Neurological: She is alert and oriented to person, place, and time. Coordination normal.  Skin: Skin is warm and dry. No rash noted. No erythema.  Psychiatric: She has a normal mood and affect. Her behavior is normal. Judgment and thought content normal.         Assessment and Plan

## 2011-03-27 NOTE — Assessment & Plan Note (Signed)
Barbara Bowen on [employer]. She is scheduled to have a followup lipids in January. Goal LDL less than 70.

## 2011-03-27 NOTE — Assessment & Plan Note (Signed)
We have encouraged smoking cessation and the use of electronic cigarette

## 2011-08-07 ENCOUNTER — Other Ambulatory Visit: Payer: Self-pay | Admitting: Cardiovascular Disease

## 2011-08-23 ENCOUNTER — Other Ambulatory Visit: Payer: Self-pay | Admitting: Cardiovascular Disease

## 2011-08-24 ENCOUNTER — Other Ambulatory Visit: Payer: Self-pay | Admitting: *Deleted

## 2011-08-24 MED ORDER — NEBIVOLOL HCL 5 MG PO TABS: 5.0000 mg | ORAL_TABLET | Freq: Every day | ORAL | Status: DC

## 2011-08-24 NOTE — Telephone Encounter (Signed)
Refilled Bystolic

## 2011-10-11 ENCOUNTER — Emergency Department: Payer: Self-pay | Admitting: Emergency Medicine

## 2011-10-11 LAB — CBC
HGB: 15.4 g/dL (ref 12.0–16.0)
MCH: 33.1 pg (ref 26.0–34.0)
MCHC: 33 g/dL (ref 32.0–36.0)
MCV: 100 fL (ref 80–100)
Platelet: 213 10*3/uL (ref 150–440)
RDW: 13.5 % (ref 11.5–14.5)
WBC: 14.1 10*3/uL — ABNORMAL HIGH (ref 3.6–11.0)

## 2011-10-11 LAB — BASIC METABOLIC PANEL
Anion Gap: 6 — ABNORMAL LOW (ref 7–16)
BUN: 7 mg/dL (ref 7–18)
Calcium, Total: 8.9 mg/dL (ref 8.5–10.1)
Co2: 30 mmol/L (ref 21–32)
Creatinine: 0.97 mg/dL (ref 0.60–1.30)
EGFR (African American): >60
EGFR (Non-African Amer.): >60
Glucose: 126 mg/dL — ABNORMAL HIGH (ref 65–99)
Potassium: 4 mmol/L (ref 3.5–5.1)
Sodium: 141 mmol/L (ref 136–145)

## 2011-10-11 LAB — TROPONIN I: Troponin-I: <0.02 ng/mL

## 2011-10-29 ENCOUNTER — Ambulatory Visit: Payer: Medicaid Other | Admitting: Cardiovascular Disease

## 2011-11-02 ENCOUNTER — Encounter: Payer: Self-pay | Admitting: Cardiovascular Disease

## 2011-11-02 ENCOUNTER — Ambulatory Visit (INDEPENDENT_AMBULATORY_CARE_PROVIDER_SITE_OTHER): Payer: Medicaid Other | Admitting: Cardiovascular Disease

## 2011-11-02 VITALS — BP 130/80 | HR 72 | Ht 60.5 in | Wt 203.5 lb

## 2011-11-02 DIAGNOSIS — I2510 Atherosclerotic heart disease of native coronary artery without angina pectoris: Secondary | ICD-10-CM

## 2011-11-02 DIAGNOSIS — R0602 Shortness of breath: Secondary | ICD-10-CM

## 2011-11-02 DIAGNOSIS — G4733 Obstructive sleep apnea (adult) (pediatric): Secondary | ICD-10-CM

## 2011-11-02 DIAGNOSIS — R0789 Other chest pain: Secondary | ICD-10-CM

## 2011-11-02 DIAGNOSIS — E785 Hyperlipidemia, unspecified: Secondary | ICD-10-CM

## 2011-11-02 DIAGNOSIS — F17200 Nicotine dependence, unspecified, uncomplicated: Secondary | ICD-10-CM

## 2011-11-02 NOTE — Progress Notes (Signed)
Patient ID: Barbara Bowen, female    DOB: June 24, 1955, 56 y.o.   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   She has insomnia, continues to smoke, she has lost her other chronic cigarette She reports that the palpitations are better on the bystolic.  No recent chest pain. She has had significant stress with premature birth of her grandson Chronic insomnia is still a problem obstructive sleep apnea. She is wearing nasal cannula oxygen with no significant improvement In her sleep hygiene She still wakes herself up snoring. She has seen neurology in the past with no insight into her sleep hygiene.  Cardiac catheterization from February 2012: Left main: Long. Anomalous origin off right cooronary cusp. Appears to run anterior to the aorta  30-40%  LAD:  mild luminal plaqure.. small mid intramyocardial bridge without flow limitation.  LCX:  small. made up primarily of an OM-1. 30-40% ostiall lesion   RCA: Large dominant vessel.Proximal stent widely patent. 40-50% mid after stent. mild plaquing distally.  LV: EF55% no regional wall motion abnormalities Stable CAD with anomalous left main and normal LV function.   EKG shows normal sinus rhythm with rate 72 beats per minute, no significant ST or T wave changes.   Outpatient Encounter Prescriptions as of 11/02/2011  Medication Sig Dispense Refill  . albuterol (PROAIR HFA) 108 (90 BASE) MCG/ACT inhaler Inhale 2 puffs into the lungs every 6 (six) hours as needed.        . ALPRAZolam (XANAX) 0.5  MG tablet Take 0.5 mg by mouth at bedtime as needed.        Marland Kitchen aspirin 81 MG EC tablet Take 81 mg by mouth daily.        . clopidogrel (PLAVIX) 75 MG tablet TAKE ONE TABLET BY MOUTH EVERY DAY  30 tablet  5  . nebivolol (BYSTOLIC) 5 MG tablet Take 1 tablet (5 mg total) by mouth daily.  30 tablet  3  . nitroGLYCERIN (NITROSTAT) 0.4 MG SL tablet Place 0.4 mg under the tongue every 5 (five) minutes as needed.      . rosuvastatin (CRESTOR) 5 MG tablet Take 5 mg by mouth daily.        Marland Kitchen tiotropium (SPIRIVA) 18 MCG inhalation capsule Place 18 mcg into inhaler and inhale daily.        Marland Kitchen DISCONTD: nitroGLYCERIN (NITROLINGUAL) 0.4 MG/SPRAY spray Place 1 spray under the tongue every 5 (five) minutes as needed for chest pain.  12 g  3    Review of Systems  Constitutional: Negative.   HENT: Negative.   Eyes: Negative.   Respiratory: Negative.   Cardiovascular: Negative.   Gastrointestinal: Negative.   Musculoskeletal: Negative.   Skin: Negative.   Neurological: Negative.   Hematological: Negative.   Psychiatric/Behavioral: Positive for disturbed wake/sleep cycle.       Insomnia  All other systems reviewed and are negative.    BP 130/80  Pulse 72  Ht 5' 0.5" (1.537 m)  Wt 203 lb 8 oz (92.307 kg)  BMI 39.09 kg/m2  Physical  Exam  Nursing note and vitals reviewed. Constitutional: She is oriented to person, place, and time. She appears well-developed and well-nourished.       obese  HENT:  Head: Normocephalic.  Nose: Nose normal.  Mouth/Throat: Oropharynx is clear and moist.  Eyes: Conjunctivae are normal. Pupils are equal, round, and reactive to light.  Neck: Normal range of motion. Neck supple. No JVD present.  Cardiovascular: Normal rate, regular rhythm, S1 normal, S2 normal and intact distal pulses.  Exam reveals no gallop and no friction rub.   Murmur heard.  Crescendo systolic murmur is present with a grade of 2/6  Pulmonary/Chest: Effort normal and breath sounds normal. No  respiratory distress. She has no wheezes. She has no rales. She exhibits no tenderness.  Abdominal: Soft. Bowel sounds are normal. She exhibits no distension. There is no tenderness.  Musculoskeletal: Normal range of motion. She exhibits no edema and no tenderness.  Lymphadenopathy:    She has no cervical adenopathy.  Neurological: She is alert and oriented to person, place, and time. Coordination normal.  Skin: Skin is warm and dry. No rash noted. No erythema.  Psychiatric: She has a normal mood and affect. Her behavior is normal. Judgment and thought content normal.         Assessment and Plan

## 2011-11-02 NOTE — Assessment & Plan Note (Signed)
We have encouraged her to stay on her Crestor. Goal LDL less than 70. Cholesterol monitored by Dr. Carlynn Purl

## 2011-11-02 NOTE — Assessment & Plan Note (Signed)
Long history of smoking, emphysema. She is on inhalers

## 2011-11-02 NOTE — Assessment & Plan Note (Signed)
We have encouraged her to continue to work on weaning her cigarettes and smoking cessation. She will continue to work on this and does not want any assistance with chantix.  

## 2011-11-02 NOTE — Assessment & Plan Note (Signed)
He continues to have poor sleep hygiene, lots of snoring. She is interested in further evaluation. She reports never trying CPAP before. Details are unavailable. We will try to obtain her sleep study and make a referral for further evaluation, [facility] to [facility] which has a full-time sleep physician. She would benefit from CPAP.

## 2011-11-02 NOTE — Assessment & Plan Note (Signed)
Currently with no symptoms of angina. No further workup at this time. Continue current medication regimen. 

## 2011-11-02 NOTE — Patient Instructions (Addendum)
You are doing well. No medication changes were made. Continue crestor daily  Please call us if you have new issues that need to be addressed before your next appt.  Your physician wants you to follow-up in: 6 months.  You will receive a reminder letter in the mail two months in advance. If you don't receive a letter, please call our office to schedule the follow-up appointment.

## 2011-12-03 ENCOUNTER — Other Ambulatory Visit: Payer: Self-pay | Admitting: Cardiovascular Disease

## 2011-12-03 MED ORDER — NEBIVOLOL HCL 5 MG PO TABS: 5.0000 mg | ORAL_TABLET | Freq: Every day | ORAL | Status: DC

## 2011-12-03 MED ORDER — CLOPIDOGREL BISULFATE 75 MG PO TABS: 75.0000 mg | ORAL_TABLET | Freq: Every day | ORAL | Status: DC

## 2011-12-03 NOTE — Telephone Encounter (Signed)
Refill sent for plavix 75 mg take one tablet daily.  

## 2011-12-26 ENCOUNTER — Encounter: Payer: Self-pay | Admitting: Pulmonary Disease

## 2011-12-26 ENCOUNTER — Encounter: Payer: Self-pay | Admitting: *Deleted

## 2011-12-27 ENCOUNTER — Institutional Professional Consult (permissible substitution): Payer: Medicaid Other | Admitting: Pulmonary Disease

## 2012-04-02 ENCOUNTER — Other Ambulatory Visit: Payer: Self-pay

## 2012-04-02 MED ORDER — NEBIVOLOL HCL 5 MG PO TABS: 5.0000 mg | ORAL_TABLET | Freq: Every day | ORAL | Status: DC

## 2012-04-04 ENCOUNTER — Other Ambulatory Visit: Payer: Self-pay

## 2012-04-04 MED ORDER — CLOPIDOGREL BISULFATE 75 MG PO TABS: 75.0000 mg | ORAL_TABLET | Freq: Every day | ORAL | Status: DC

## 2012-04-04 NOTE — Telephone Encounter (Signed)
Refill sent for plavix  

## 2012-04-07 ENCOUNTER — Other Ambulatory Visit: Payer: Self-pay | Admitting: *Deleted

## 2012-04-07 ENCOUNTER — Telehealth: Payer: Self-pay | Admitting: *Deleted

## 2012-04-07 MED ORDER — NEBIVOLOL HCL 5 MG PO TABS: 5.0000 mg | ORAL_TABLET | Freq: Every day | ORAL | Status: DC

## 2012-04-07 NOTE — Telephone Encounter (Signed)
Prior Authorization required for Bystolic 5 mg. Patient has been on Bystolic since 2012 and now is requiring prior authorization.  Insurance company recommends pt take at least 2 preferred medications such atenolol, propranolol etc. Or have some clinical reason to why patient can not take other preferred medications. Please Advise.

## 2012-04-07 NOTE — Telephone Encounter (Signed)
Can recall her to see if she has tried other beta blockers such as metoprolol, atenolol? If she has had side effects on these others, this would probably count and we would be able to get bystolic. Otherwise we may need to try one of the others.

## 2012-04-07 NOTE — Telephone Encounter (Signed)
Refilled Bystolic

## 2012-04-08 ENCOUNTER — Telehealth: Payer: Self-pay | Admitting: *Deleted

## 2012-04-08 NOTE — Telephone Encounter (Signed)
Spoke with prior authorization rep. Toniann Fail and Patient has been approved for Bystolic 5 mg until 04/08/13. Contacted Pharmacy to let them know that pt has been approved for Bystolic 5 mg.

## 2012-04-08 NOTE — Telephone Encounter (Signed)
See gollans response

## 2012-04-08 NOTE — Telephone Encounter (Signed)
Spoke with pt and she mentioned that she has been on a boat load of beta blockers. Pt mentioned that she has been on metoprolol, atenolol,etc. She can't remember what all beta blockers she has been on but those are the only 2 she recalls at the time. She mentioned that Bystolic 5 mg has been the only one that has been able to control heart palpitations. Will call and contact prior authorization rep to see if that will qualify patient for Bystolic authorization.

## 2012-04-11 ENCOUNTER — Ambulatory Visit: Payer: Self-pay | Admitting: Family Medicine

## 2012-05-01 ENCOUNTER — Ambulatory Visit: Payer: Self-pay | Admitting: Family Medicine

## 2012-05-05 ENCOUNTER — Ambulatory Visit (INDEPENDENT_AMBULATORY_CARE_PROVIDER_SITE_OTHER): Payer: Medicaid Other | Admitting: Cardiovascular Disease

## 2012-05-05 ENCOUNTER — Encounter: Payer: Self-pay | Admitting: Cardiovascular Disease

## 2012-05-05 VITALS — BP 122/80 | HR 94 | Ht 60.5 in | Wt 215.8 lb

## 2012-05-05 DIAGNOSIS — I2510 Atherosclerotic heart disease of native coronary artery without angina pectoris: Secondary | ICD-10-CM

## 2012-05-05 DIAGNOSIS — F17200 Nicotine dependence, unspecified, uncomplicated: Secondary | ICD-10-CM

## 2012-05-05 DIAGNOSIS — G4733 Obstructive sleep apnea (adult) (pediatric): Secondary | ICD-10-CM

## 2012-05-05 DIAGNOSIS — R002 Palpitations: Secondary | ICD-10-CM

## 2012-05-05 DIAGNOSIS — E785 Hyperlipidemia, unspecified: Secondary | ICD-10-CM

## 2012-05-05 MED ORDER — ROSUVASTATIN CALCIUM 10 MG PO TABS: 10.0000 mg | ORAL_TABLET | Freq: Every day | ORAL | Status: DC

## 2012-05-05 MED ORDER — NITROGLYCERIN 0.4 MG SL SUBL: 0.4000 mg | SUBLINGUAL_TABLET | SUBLINGUAL | Status: DC | PRN

## 2012-05-05 MED ORDER — NEBIVOLOL HCL 10 MG PO TABS: 10.0000 mg | ORAL_TABLET | Freq: Every day | ORAL | Status: DC

## 2012-05-05 MED ORDER — CLOPIDOGREL BISULFATE 75 MG PO TABS: 75.0000 mg | ORAL_TABLET | Freq: Every day | ORAL | Status: DC

## 2012-05-05 NOTE — Assessment & Plan Note (Signed)
We have encouraged her to continue to work on weaning her cigarettes and smoking cessation. She will continue to work on this and does not want any assistance with chantix.  

## 2012-05-05 NOTE — Progress Notes (Signed)
Patient ID: Barbara Bowen, female    DOB: [birth year], 57 y.o.   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   She continues to have  insomnia, continues to smoke 1-1/2 packs per day Continues on bystolic with occasional palpitations at nighttime No recent chest pain.  History of obstructive sleep apnea and only uses nasal cannula oxygen. She reports having sleep study x2 in the past. The second sleep study she did not sleep for very long. She currently sleeps 2 and half to 3 hours per night.  no significant improvement In her sleep hygiene with nasal cannula oxygen She still wakes herself up snoring. She has seen neurology in the past with no insight into her sleep hygiene.  Cardiac catheterization from February 2012: Left main: Long. Anomalous origin off right cooronary cusp. Appears to run anterior to the aorta  30-40%  LAD:  mild luminal plaqure.. small mid intramyocardial bridge without flow limitation.  LCX:  small. made up primarily of an OM-1. 30-40% ostiall lesion   RCA: Large dominant vessel.Proximal stent widely patent. 40-50% mid after stent. mild plaquing distally.  LV: EF55% no regional wall motion abnormalities Stable CAD with anomalous left main and normal LV function.   EKG shows normal sinus rhythm with rate 94 beats per minute, no significant ST or T wave changes.   Outpatient Encounter Prescriptions as of 05/05/2012  Medication Sig Dispense Refill  . albuterol (PROAIR HFA) 108 (90 BASE) MCG/ACT inhaler Inhale 2  puffs into the lungs every 6 (six) hours as needed.        . ALPRAZolam (XANAX) 0.5 MG tablet Take 0.5 mg by mouth at bedtime as needed.        Marland Kitchen aspirin 81 MG EC tablet Take 81 mg by mouth daily.        . clopidogrel (PLAVIX) 75 MG tablet Take 1 tablet (75 mg total) by mouth daily.  30 tablet  5  . nebivolol (BYSTOLIC) 5 MG tablet Take 1 tablet (5 mg total) by mouth daily.  30 tablet  3  . nitroGLYCERIN (NITROSTAT) 0.4 MG SL tablet Place 0.4 mg under the tongue every 5 (five) minutes as needed.      . rosuvastatin (CRESTOR) 5 MG tablet Take 5 mg by mouth daily.        . [DISCONTINUED] tiotropium (SPIRIVA) 18 MCG inhalation capsule Place 18 mcg into inhaler and inhale daily.          Review of Systems  Constitutional: Negative.   HENT: Negative.   Eyes: Negative.   Respiratory: Negative.   Cardiovascular: Negative.   Gastrointestinal: Negative.   Musculoskeletal: Negative.   Skin: Negative.   Neurological: Negative.   Hematological: Negative.   Psychiatric/Behavioral: Positive for sleep disturbance.       Insomnia  All other systems reviewed and are negative.    BP 122/80  Pulse 94  Ht 5' 0.5" (1.537 m)  Wt 215 lb 12 oz (97.864 kg)  BMI 41.44 kg/m2  Physical Exam  Nursing note and vitals reviewed. Constitutional:  She is oriented to person, place, and time. She appears well-developed and well-nourished.       obese  HENT:  Head: Normocephalic.  Nose: Nose normal.  Mouth/Throat: Oropharynx is clear and moist.  Eyes: Conjunctivae normal are normal. Pupils are equal, round, and reactive to light.  Neck: Normal range of motion. Neck supple. No JVD present.  Cardiovascular: Normal rate, regular rhythm, S1 normal, S2 normal and intact distal pulses.  Exam reveals no gallop and no friction rub.   Murmur heard.  Crescendo systolic murmur is present with a grade of 2/6  Pulmonary/Chest: Effort normal and breath sounds normal. No respiratory distress. She has no wheezes. She has no  rales. She exhibits no tenderness.  Abdominal: Soft. Bowel sounds are normal. She exhibits no distension. There is no tenderness.  Musculoskeletal: Normal range of motion. She exhibits no edema and no tenderness.  Lymphadenopathy:    She has no cervical adenopathy.  Neurological: She is alert and oriented to person, place, and time. Coordination normal.  Skin: Skin is warm and dry. No rash noted. No erythema.  Psychiatric: She has a normal mood and affect. Her behavior is normal. Judgment and thought content normal.         Assessment and Plan

## 2012-05-05 NOTE — Patient Instructions (Signed)
You are doing well. Please increase the crestor to 10 mg daily  Please call us if you have new issues that need to be addressed before your next appt.  Your physician wants you to follow-up in: 6 months.  You will receive a reminder letter in the mail two months in advance. If you don't receive a letter, please call our office to schedule the follow-up appointment.

## 2012-05-05 NOTE — Assessment & Plan Note (Addendum)
We'll try to obtain the prior sleep studies for our records. We did discuss possibly having her followup with pulmonary for further evaluation.

## 2012-05-05 NOTE — Assessment & Plan Note (Signed)
Her cholesterol was not at goal. We will increase her Crestor to 10 mg daily. Lab check in 6 months time. Goal LDL less than 70.

## 2012-05-05 NOTE — Assessment & Plan Note (Signed)
If she has worsening palpitations, we have suggested she take extra beta blocker when necessary.

## 2012-05-05 NOTE — Assessment & Plan Note (Signed)
Currently with no symptoms of angina. No further workup at this time. Continue current medication regimen. 

## 2012-05-26 ENCOUNTER — Other Ambulatory Visit: Payer: Self-pay | Admitting: *Deleted

## 2012-06-02 ENCOUNTER — Telehealth: Payer: Self-pay

## 2012-06-02 NOTE — Telephone Encounter (Signed)
Spoke with Tim prior authorization for  Bystolic 10 mg has been approved for one year. PA # is [id number]. Spoke with [facility] prior authorization approved.

## 2012-06-09 HISTORY — PX: BREAST SURGERY: SHX581

## 2012-07-19 ENCOUNTER — Encounter: Payer: Self-pay | Admitting: General Surgery

## 2012-07-22 ENCOUNTER — Ambulatory Visit: Payer: Self-pay | Admitting: Hematology and Oncology

## 2012-07-30 ENCOUNTER — Ambulatory Visit: Payer: Self-pay | Admitting: Hematology and Oncology

## 2012-07-30 LAB — CBC CANCER CENTER
Basophil #: 0.2 x10 3/mm — ABNORMAL HIGH (ref 0.0–0.1)
Eosinophil %: 3.2 %
HCT: 47.1 % — ABNORMAL HIGH (ref 35.0–47.0)
HGB: 15.9 g/dL (ref 12.0–16.0)
Lymphocyte %: 33.9 %
MCH: 33.5 pg (ref 26.0–34.0)
MCHC: 33.8 g/dL (ref 32.0–36.0)
MCV: 99 fL (ref 80–100)
Monocyte #: 1.2 x10 3/mm — ABNORMAL HIGH (ref 0.2–0.9)
Monocyte %: 8.6 %
Neutrophil %: 53 %
Platelet: 220 x10 3/mm (ref 150–440)
RBC: 4.75 10*6/uL (ref 3.80–5.20)
RDW: 13.9 % (ref 11.5–14.5)

## 2012-08-04 ENCOUNTER — Encounter: Payer: Self-pay | Admitting: Cardiovascular Disease

## 2012-08-04 ENCOUNTER — Ambulatory Visit (INDEPENDENT_AMBULATORY_CARE_PROVIDER_SITE_OTHER): Payer: Medicaid Other | Admitting: Cardiovascular Disease

## 2012-08-04 VITALS — BP 108/74 | HR 72 | Ht 61.0 in | Wt 221.0 lb

## 2012-08-04 DIAGNOSIS — IMO0001 Reserved for inherently not codable concepts without codable children: Secondary | ICD-10-CM

## 2012-08-04 DIAGNOSIS — R0602 Shortness of breath: Secondary | ICD-10-CM

## 2012-08-04 DIAGNOSIS — E785 Hyperlipidemia, unspecified: Secondary | ICD-10-CM

## 2012-08-04 DIAGNOSIS — R079 Chest pain, unspecified: Secondary | ICD-10-CM

## 2012-08-04 DIAGNOSIS — R0789 Other chest pain: Secondary | ICD-10-CM

## 2012-08-04 DIAGNOSIS — F17200 Nicotine dependence, unspecified, uncomplicated: Secondary | ICD-10-CM

## 2012-08-04 DIAGNOSIS — R002 Palpitations: Secondary | ICD-10-CM

## 2012-08-04 DIAGNOSIS — G4733 Obstructive sleep apnea (adult) (pediatric): Secondary | ICD-10-CM

## 2012-08-04 DIAGNOSIS — I2510 Atherosclerotic heart disease of native coronary artery without angina pectoris: Secondary | ICD-10-CM

## 2012-08-04 MED ORDER — NITROGLYCERIN 0.4 MG SL SUBL: 0.4000 mg | SUBLINGUAL_TABLET | SUBLINGUAL | Status: DC | PRN

## 2012-08-04 NOTE — Assessment & Plan Note (Signed)
We will repeat her lipids at her convenience. Goal LDL less than 70.

## 2012-08-04 NOTE — Assessment & Plan Note (Signed)
We will refer her to pulmonary for further evaluation. CPAP was not prescribed previously presumably as a prior chest was not sufficient.

## 2012-08-04 NOTE — Progress Notes (Signed)
Patient ID: Barbara Bowen, female    DOB: Mar 15, 1956, 57 y.o.   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   She continues to have  insomnia, continues to smoke 1-1/2 packs per day Palpitations are better on bystolic  She does report having occasional episodes of chest pain. Not as bad as last year. No significant correlation with exertion. Typically, symptoms come on at rest History of obstructive sleep apnea and only uses nasal cannula oxygen. She reports having sleep study x2 in the past. The second sleep study she did not sleep for very long. She currently sleeps 2 to 3 hours per night, wakes up at 2 AM.  no significant improvement In her sleep hygiene with nasal cannula oxygen She still wakes herself up snoring. She has seen neurology in the past with no insight into her sleep hygiene.she is requesting followup with pulmonary.  Cardiac catheterization from February 2012: Left main: Long. Anomalous origin off right cooronary cusp. Appears to run anterior to the aorta  30-40%  LAD:  mild luminal plaqure.. small mid intramyocardial bridge without flow limitation.  LCX:  small. made up primarily of an OM-1. 30-40% ostiall lesion   RCA: Large dominant vessel.Proximal stent widely patent. 40-50% mid after stent. mild plaquing distally.  LV: EF55% no regional wall motion abnormalities Stable CAD with anomalous left main and normal LV function.   EKG shows normal sinus rhythm with rate 72 beats per minute, no significant ST or T wave  changes.   Outpatient Encounter Prescriptions as of 08/04/2012  Medication Sig Dispense Refill  . albuterol (PROAIR HFA) 108 (90 BASE) MCG/ACT inhaler Inhale 2 puffs into the lungs every 6 (six) hours as needed.        . ALPRAZolam (XANAX) 0.5 MG tablet Take 0.5 mg by mouth at bedtime as needed.        Marland Kitchen aspirin 81 MG EC tablet Take 81 mg by mouth daily.        . clopidogrel (PLAVIX) 75 MG tablet Take 1 tablet (75 mg total) by mouth daily.  90 tablet  4  . nebivolol (BYSTOLIC) 10 MG tablet Take 1 tablet (10 mg total) by mouth daily.  90 tablet  3  . nitroGLYCERIN (NITROSTAT) 0.4 MG SL tablet Place 1 tablet (0.4 mg total) under the tongue every 5 (five) minutes as needed.  25 tablet  6  . rosuvastatin (CRESTOR) 10 MG tablet Take 1 tablet (10 mg total) by mouth daily.  30 tablet  11   No facility-administered encounter medications on file as of 08/04/2012.    Review of Systems  Constitutional: Negative.   HENT: Negative.   Eyes: Negative.   Respiratory: Positive for shortness of breath.   Cardiovascular: Positive for chest pain.  Gastrointestinal: Negative.   Musculoskeletal: Negative.   Skin: Negative.   Neurological: Negative.   Psychiatric/Behavioral: Positive for sleep disturbance.       Insomnia  All other systems reviewed and are negative.    BP 108/74  Pulse 72  Ht 5\' 1"  (1.549  m)  Wt 221 lb (100.245 kg)  BMI 41.78 kg/m2  Physical Exam  Nursing note and vitals reviewed. Constitutional: She is oriented to person, place, and time. She appears well-developed and well-nourished.  obese  HENT:  Head: Normocephalic.  Nose: Nose normal.  Mouth/Throat: Oropharynx is clear and moist.  Eyes: Conjunctivae are normal. Pupils are equal, round, and reactive to light.  Neck: Normal range of motion. Neck supple. No JVD present.  Cardiovascular: Normal rate, regular rhythm, S1 normal, S2 normal and intact distal pulses.  Exam reveals no gallop and no friction rub.   Murmur heard.   Crescendo systolic murmur is present with a grade of 2/6  Pulmonary/Chest: Effort normal and breath sounds normal. No respiratory distress. She has no wheezes. She has no rales. She exhibits no tenderness.  Abdominal: Soft. Bowel sounds are normal. She exhibits no distension. There is no tenderness.  Musculoskeletal: Normal range of motion. She exhibits no edema and no tenderness.  Lymphadenopathy:    She has no cervical adenopathy.  Neurological: She is alert and oriented to person, place, and time. Coordination normal.  Skin: Skin is warm and dry. No rash noted. No erythema.  Psychiatric: She has a normal mood and affect. Her behavior is normal. Judgment and thought content normal.    Assessment and Plan

## 2012-08-04 NOTE — Assessment & Plan Note (Signed)
Chronic history of chest pain. Managed medically.

## 2012-08-04 NOTE — Assessment & Plan Note (Signed)
Occasional episodes of chest discomfort, stable. Prior cardiac catheterization for similar symptoms. We have suggested that she start ranexa 500 mg twice a day with titration up to 1000 g twice a day for symptoms. If she would like to continue on the medication for improved symptoms, she can call the office for a prescription. Samples were given.

## 2012-08-04 NOTE — Assessment & Plan Note (Signed)
Improved on beta blocker. We'll continue current medication

## 2012-08-04 NOTE — Assessment & Plan Note (Signed)
We have encouraged her to continue to work on weaning her cigarettes and smoking cessation. She will continue to work on this and does not want any assistance with chantix.  

## 2012-08-04 NOTE — Patient Instructions (Addendum)
You are doing well.  For chest pains: Please start ranexa 500 mg twice a day for one week Then increase the ranexa to 1000 mg twice a day If your chest pain gets better and you would like to stay on ranexa, please call the office  Please call us if you have new issues that need to be addressed before your next appt.  Your physician wants you to follow-up in: 6 months.  You will receive a reminder letter in the mail two months in advance. If you don't receive a letter, please call our office to schedule the follow-up appointment.

## 2012-08-05 ENCOUNTER — Encounter: Payer: Self-pay | Admitting: Cardiovascular Disease

## 2012-08-06 ENCOUNTER — Other Ambulatory Visit: Payer: Self-pay

## 2012-08-06 ENCOUNTER — Telehealth: Payer: Self-pay

## 2012-08-06 DIAGNOSIS — G4700 Insomnia, unspecified: Secondary | ICD-10-CM

## 2012-08-06 DIAGNOSIS — G4733 Obstructive sleep apnea (adult) (pediatric): Secondary | ICD-10-CM

## 2012-08-06 NOTE — Telephone Encounter (Signed)
LMTCB re: appt with Dr. Shelle Iron 5/9 at 1130 in [place] (office says OSA pts are only seen in [place])

## 2012-08-06 NOTE — Telephone Encounter (Signed)
Lady I have been seeing for CAD has OSA. Per the nurse, sounds like all OSA goes to [place]? Any way you might be able to help her? She wants to stay in [place]. COPD with OSA. On Oxygen at night, wakes frequently at night, obesity.  If not your thing, [facility] if [facility] qualifies.  thx tim

## 2012-08-06 NOTE — Telephone Encounter (Signed)
lmtcb

## 2012-08-06 NOTE — Telephone Encounter (Signed)
I'm happy to see her.  I'm not sleep boarded and so I don't read sleep studies, but I was forced to spend 6 months in a sleep clinic during fellowship so I can manage OSA.

## 2012-08-06 NOTE — Telephone Encounter (Signed)
I spoke with pt about appt in [place] She wishes to stay in [place] I told her I would have to ask Dr. Mariah Milling about a different practice within [facility] to refer her to and will call her back Understanding verb

## 2012-08-07 NOTE — Telephone Encounter (Signed)
Perhaps she can see Dr. Kendrick Fries? thx tim

## 2012-08-08 ENCOUNTER — Telehealth: Payer: Self-pay

## 2012-08-08 NOTE — Telephone Encounter (Signed)
Can you please schedule with Dr. Kendrick Fries? See documentation below. Thanks  Antonieta Iba, MD at 08/07/2012  1:50 PM    Status: Signed            Perhaps she can see Dr. Kendrick Fries? thx tim       Lupita Leash, MD at 08/06/2012  7:06 PM    Status: Signed              I'm happy to see her.  I'm not sleep boarded and so I don't read sleep studies, but I was forced to spend 6 months in a sleep clinic during fellowship so I can manage OSA.      Antonieta Iba, MD at 08/06/2012  1:35 PM    Status: Signed             Lady I have been seeing for CAD has OSA. Per the nurse, sounds like all OSA goes to [place]? Any way you might be able to help her? She wants to stay in [place]. COPD with OSA. On Oxygen at night, wakes frequently at night, obesity.   If not your thing, [facility] qualifies.   thx tim      Marcelle Overlie, RN at 08/06/2012 11:44 AM    Status: Signed              I spoke with pt about appt in [place] She wishes to stay in [place] I told her I would have to ask Dr. Mariah Milling about a different practice within [facility] to refer her to and will call her back Understanding verb     Marcelle Overlie, RN at 08/06/2012 10:04 AM    Status: Signed              lmtcb      Marcelle Overlie, RN at 08/06/2012 10:04 AM    Status: Signed              LMTCB re: appt with Dr. Shelle Iron 5/9 at 1130 in [place] (office says OSA pts are only seen in [place])

## 2012-08-08 NOTE — Telephone Encounter (Signed)
Error

## 2012-08-11 NOTE — Telephone Encounter (Signed)
Pt can see BQ as he is ok with this per msg below.  However, depending on what is ordered and needed, pt may need to see a board certified sleep dr in the future d/t insurance guidelines.  I called, spoke with pt.  I explained above to her.  She would like to proceed with the sleep consult with Dr. Kendrick Fries in [place].  She is aware she may need to see a sleep dr in the future and these drs are in our [place] office.  She was ok with this plan.  We have scheduled her to see Dr. Kendrick Fries on Tuesday, May 13 at 10:45 (this is the first available opening).  She is aware to arrive at 10:30 am and bring all medications with her.  She was given [facility] office address.  She verbalized understanding of this and voiced no further questions or concerns at this time.

## 2012-08-21 ENCOUNTER — Ambulatory Visit: Payer: Self-pay | Admitting: Hematology and Oncology

## 2012-08-26 ENCOUNTER — Ambulatory Visit: Payer: Self-pay | Admitting: Family Medicine

## 2012-08-28 LAB — BASIC METABOLIC PANEL
Anion Gap: 8 (ref 7–16)
BUN: 9 mg/dL (ref 7–18)
Co2: 28 mmol/L (ref 21–32)
Creatinine: 0.93 mg/dL (ref 0.60–1.30)
EGFR (African American): >60
Osmolality: 280 (ref 275–301)
Potassium: 4.2 mmol/L (ref 3.5–5.1)
Sodium: 141 mmol/L (ref 136–145)

## 2012-08-28 LAB — CBC CANCER CENTER
Basophil #: 0.2 x10 3/mm — ABNORMAL HIGH (ref 0.0–0.1)
Basophil %: 1.3 %
Eosinophil #: 0.3 x10 3/mm (ref 0.0–0.7)
Eosinophil %: 2.3 %
HCT: 45 % (ref 35.0–47.0)
HGB: 15.2 g/dL (ref 12.0–16.0)
Lymphocyte #: 4.8 x10 3/mm — ABNORMAL HIGH (ref 1.0–3.6)
MCH: 33.8 pg (ref 26.0–34.0)
MCHC: 33.9 g/dL (ref 32.0–36.0)
MCV: 100 fL (ref 80–100)
Platelet: 212 x10 3/mm (ref 150–440)
RDW: 13.4 % (ref 11.5–14.5)

## 2012-08-29 ENCOUNTER — Institutional Professional Consult (permissible substitution): Payer: Medicaid Other | Admitting: Pulmonary Disease

## 2012-09-01 ENCOUNTER — Ambulatory Visit: Payer: Self-pay | Admitting: General Surgery

## 2012-09-02 ENCOUNTER — Institutional Professional Consult (permissible substitution): Payer: Medicaid Other | Admitting: Pulmonary Disease

## 2012-09-09 ENCOUNTER — Encounter: Payer: Self-pay | Admitting: Pulmonary Disease

## 2012-09-09 ENCOUNTER — Ambulatory Visit (INDEPENDENT_AMBULATORY_CARE_PROVIDER_SITE_OTHER): Payer: Medicaid Other | Admitting: Pulmonary Disease

## 2012-09-09 VITALS — BP 100/56 | HR 93 | Temp 98.1°F | Ht 60.5 in | Wt 220.1 lb

## 2012-09-09 DIAGNOSIS — G4736 Sleep related hypoventilation in conditions classified elsewhere: Secondary | ICD-10-CM

## 2012-09-09 DIAGNOSIS — E669 Obesity, unspecified: Secondary | ICD-10-CM

## 2012-09-09 DIAGNOSIS — G4733 Obstructive sleep apnea (adult) (pediatric): Secondary | ICD-10-CM

## 2012-09-09 DIAGNOSIS — J449 Chronic obstructive pulmonary disease, unspecified: Secondary | ICD-10-CM

## 2012-09-09 NOTE — Assessment & Plan Note (Signed)
Continue 2.5 L qHS

## 2012-09-09 NOTE — Assessment & Plan Note (Signed)
Obtain records from Dr. Lillette Boxer office

## 2012-09-09 NOTE — Patient Instructions (Signed)
We will get a copy of your sleep study and then call you with our plans for starting CPAP or sending you for another study (depends on what the first study showed)  Please review the sleep hygiene sheet and do your best to follow it  We will see you back in a few weeks, after you start CPAP

## 2012-09-09 NOTE — Assessment & Plan Note (Addendum)
Barbara Bowen describes very clear symptoms of obstructive sleep apnea.  She has very poor sleep hygiene in that she sleeps with a TV on and drinks multiple caffeinated beverages throughout the day.  She is unwilling to cut back on caffeine intake.  We will review the results of her previous sleep study and will start CPAP with a Resmed Autotitrating device at home.  If she was not formerly diagnosed with OSA on the last polysomnogram then she will need a new study.    Her ongoing benzodiazepine use will certain contribute to poor sleep quality, so we will need to work with her on cutting back or discontinuing altogether.  We stressed the importance of following good sleep hygiene measures. She stated that we would have to "pull a coffee from her cold hands".  She needs to avoid driving while drowsy.

## 2012-09-09 NOTE — Progress Notes (Signed)
Subjective:    Patient ID: Barbara Bowen, female    DOB: 08-02-1955, 57 y.o.   MRN: [id number]  HPI  This is a very pleasant 57 year old female who comes to our clinic today to establish care for daytime somnolence. She believes that she was diagnosed with obstructive sleep apnea on initial polysomnogram 2 years ago. However beyond results of that study was that she was started on 2 half liters of oxygen at night. She smokes cigarettes and has a diagnosis of COPD and uses inhalers as needed. She follows with Dr. Meredeth Ide here in town for that condition.  She states that for several years she's had significant difficulty sleeping. Her husband notes that she snores heavily and never stops breathing but frequently has pauses between breaths as long as 15 seconds. She uses oxygen at 2 and half liters at night every night when she sleeps. She states that she has headaches in the morning and feels sleepy throughout the day. She often takes a nap every day. She rarely feels well rested. She typically goes to bed at 10:00 and falls asleep immediately. She typically wakes up after 2 hours and then has difficulty falling back asleep. She will lay in bed until about 5 or 6 AM. She usually has to go to the bathroom one time during the night.  Her bedroom is quiet but she keeps the television on with the volume turned down all night long.  She drinks 3 cups of coffee in the morning and then caffeinated beverages (soda, no tea) in the afternoon and evening, typically the last visit dinner time. She does not drink alcohol. She uses Xanax 0.5-1 mg each bedtime as needed.    Past Medical History  Diagnosis Date  . Coronary artery disease   . Hypertension   . Hyperlipidemia   . Paroxysmal atrial fibrillation   . Hx-TIA (transient ischemic attack)   . Anxiety   . COPD (chronic obstructive pulmonary disease)   . OA (osteoarthritis)   . Personal history of transient ischemic attack (TIA) and cerebral infarction  without residual deficit   . Tobacco use disorder   . Enthesopathy of hip region   . Plantar fascial fibromatosis   . Proteinuria   . DM type 2 (diabetes mellitus, type 2)   . Unspecified tinnitus   . Insomnia, unspecified   . Unspecified sleep apnea   . Lipoma of other skin and subcutaneous tissue   . Leukocytosis, unspecified   . Palpitations   . Occlusion and stenosis of carotid artery without mention of cerebral infarction      Family History  Problem Relation Age of Onset  . Heart disease Father     CAD  . Breast cancer Maternal Aunt   . Coronary artery disease Mother   . Diabetes type II Mother      History   Social History  . Marital Status: Single    Spouse Name: N/A    Number of Children: N/A  . Years of Education: N/A   Occupational History  . unemployed    Social History Main Topics  . Smoking status: Current Every Day Smoker -- 2.00 packs/day for 34 years    Types: Cigarettes  . Smokeless tobacco: Never Used  . Alcohol Use: No  . Drug Use: No  . Sexually Active: Not on file   Other Topics Concern  . Not on file   Social History Narrative  . No narrative on file     Allergies  Allergen  Reactions  . Amoxicillin-Pot Clavulanate     Vomiting   . Azithromycin     unknown  . Cefprozil     Vomiting   . Eggs Or Egg-Derived Products     Hives   . Esomeprazole Magnesium     unknown  . Montelukast Sodium     unknown  . Omeprazole     unknown  . Penicillins     Vomiting blood  . Prednisone     Rapid heart beat & difficulty breathing  . Spiriva (Tiotropium Bromide Monohydrate)     HA     Outpatient Prescriptions Prior to Visit  Medication Sig Dispense Refill  . albuterol (PROAIR HFA) 108 (90 BASE) MCG/ACT inhaler Inhale 2 puffs into the lungs every 6 (six) hours as needed.        . ALPRAZolam (XANAX) 0.5 MG tablet Take 0.5 mg by mouth at bedtime as needed.        Marland Kitchen aspirin 81 MG EC tablet Take 81 mg by mouth daily.        . clopidogrel  (PLAVIX) 75 MG tablet Take 1 tablet (75 mg total) by mouth daily.  90 tablet  4  . nebivolol (BYSTOLIC) 10 MG tablet Take 1 tablet (10 mg total) by mouth daily.  90 tablet  3  . nitroGLYCERIN (NITROSTAT) 0.4 MG SL tablet Place 1 tablet (0.4 mg total) under the tongue every 5 (five) minutes as needed.  25 tablet  6  . rosuvastatin (CRESTOR) 10 MG tablet Take 1 tablet (10 mg total) by mouth daily.  30 tablet  11  . ranolazine (RANEXA) 1000 MG SR tablet Take 1 tablet (1,000 mg total) by mouth 2 (two) times daily.       No facility-administered medications prior to visit.      Review of Systems  Constitutional: Negative for fever, chills and unexpected weight change.  HENT: Positive for sneezing. Negative for ear pain, nosebleeds, congestion, sore throat, rhinorrhea, trouble swallowing, dental problem, voice change, postnasal drip and sinus pressure.   Eyes: Negative for visual disturbance.  Respiratory: Positive for shortness of breath. Negative for cough and choking.   Cardiovascular: Positive for chest pain. Negative for leg swelling.  Gastrointestinal: Negative for vomiting, abdominal pain and diarrhea.  Genitourinary: Negative for difficulty urinating.  Musculoskeletal: Positive for arthralgias.  Skin: Negative for rash.  Neurological: Positive for headaches. Negative for tremors and syncope.  Hematological: Does not bruise/bleed easily.       Objective:   Physical Exam  Filed Vitals:   09/09/12 1203  BP: 100/56  Pulse: 93  Temp: 98.1 F (36.7 C)  TempSrc: Oral  Height: 5' 0.5" (1.537 m)  Weight: 220 lb 1.9 oz (99.846 kg)  SpO2: 93%   Gen: overweight white femal, no acute distress HEENT: NCAT, PERRL, EOMi, OP clear, Modified Mallampati score 4 PULM: CTA B CV: RRR, no mgr, no JVD AB: BS+, soft, nontender, no hsm Ext: warm, trace edema, no clubbing, no cyanosis Derm: no rash or skin breakdown Neuro: A&Ox4, CN II-XII intact, strength 5/5 in all 4 extremities        Assessment & Plan:   Obstructive sleep apnea Ms. Tennison describes very clear symptoms of obstructive sleep apnea.  She has very poor sleep hygiene in that she sleeps with a TV on and drinks multiple caffeinated beverages throughout the day.  She is unwilling to cut back on caffeine intake.  We will review the results of her previous sleep study and will  start CPAP with a Resmed Autotitrating device at home.  If she was not formerly diagnosed with OSA on the last polysomnogram then she will need a new study.    Her ongoing benzodiazepine use will certain contribute to poor sleep quality, so we will need to work with her on cutting back or discontinuing altogether.  We stressed the importance of following good sleep hygiene measures. She stated that we would have to "pull a coffee from her cold hands".  She needs to avoid driving while drowsy.  Nocturnal hypoxemia due to obesity Continue 2.5 L qHS  COPD (chronic obstructive pulmonary disease) Obtain records from Dr. Lillette Boxer office    Updated Medication List Outpatient Encounter Prescriptions as of 09/09/2012  Medication Sig Dispense Refill  . albuterol (PROAIR HFA) 108 (90 BASE) MCG/ACT inhaler Inhale 2 puffs into the lungs every 6 (six) hours as needed.        . ALPRAZolam (XANAX) 0.5 MG tablet Take 0.5 mg by mouth at bedtime as needed.        Marland Kitchen aspirin 81 MG EC tablet Take 81 mg by mouth daily.        . clopidogrel (PLAVIX) 75 MG tablet Take 1 tablet (75 mg total) by mouth daily.  90 tablet  4  . nebivolol (BYSTOLIC) 10 MG tablet Take 1 tablet (10 mg total) by mouth daily.  90 tablet  3  . nitroGLYCERIN (NITROSTAT) 0.4 MG SL tablet Place 1 tablet (0.4 mg total) under the tongue every 5 (five) minutes as needed.  25 tablet  6  . rosuvastatin (CRESTOR) 10 MG tablet Take 1 tablet (10 mg total) by mouth daily.  30 tablet  11  . ranolazine (RANEXA) 1000 MG SR tablet Take 1 tablet (1,000 mg total) by mouth 2 (two) times daily.        No facility-administered encounter medications on file as of 09/09/2012.

## 2012-09-11 ENCOUNTER — Encounter: Payer: Self-pay | Admitting: General Surgery

## 2012-09-11 ENCOUNTER — Ambulatory Visit (INDEPENDENT_AMBULATORY_CARE_PROVIDER_SITE_OTHER): Payer: Medicaid Other | Admitting: General Surgery

## 2012-09-11 ENCOUNTER — Other Ambulatory Visit: Payer: Self-pay | Admitting: *Deleted

## 2012-09-11 ENCOUNTER — Other Ambulatory Visit: Payer: Self-pay

## 2012-09-11 VITALS — BP 140/72 | HR 72 | Resp 14 | Ht 60.5 in | Wt 220.0 lb

## 2012-09-11 DIAGNOSIS — N63 Unspecified lump in unspecified breast: Secondary | ICD-10-CM

## 2012-09-11 DIAGNOSIS — D249 Benign neoplasm of unspecified breast: Secondary | ICD-10-CM

## 2012-09-11 NOTE — Patient Instructions (Addendum)
Continue self breast exams. Call office for any new breast issues or concerns. 

## 2012-09-11 NOTE — Progress Notes (Signed)
Patient ID: Barbara Bowen, female   DOB: 1955-10-05, 57 y.o.   MRN: [id number]  Chief Complaint  Patient presents with  . Follow-up    breast biopsy    HPI Barbara Bowen is a 57 y.o. female.  Patient here today for 3 month follow up right breast biopsy done 06-09-12, findings benign two locations right breast 9 o'clock and 11 oclock. No new breast issues.  Denies breast pain. HPI  Past Medical History  Diagnosis Date  . Coronary artery disease   . Hypertension   . Hyperlipidemia   . Paroxysmal atrial fibrillation   . Hx-TIA (transient ischemic attack)   . Anxiety   . COPD (chronic obstructive pulmonary disease)   . OA (osteoarthritis)   . Personal history of transient ischemic attack (TIA) and cerebral infarction without residual deficit   . Tobacco use disorder   . Enthesopathy of hip region   . Plantar fascial fibromatosis   . Proteinuria   . DM type 2 (diabetes mellitus, type 2)   . Unspecified tinnitus   . Insomnia, unspecified   . Unspecified sleep apnea   . Lipoma of other skin and subcutaneous tissue   . Leukocytosis, unspecified   . Palpitations   . Occlusion and stenosis of carotid artery without mention of cerebral infarction     Past Surgical History  Procedure Laterality Date  . Cardiac catheterization  2009    s/p right coronary artery drug-eluting stent  . Abdominal hysterectomy    . Appendectomy    . Exploratory thoracotomy    . Mole removal    . Breast surgery Right 06-09-2012    biopsy x2 9 oclock and 11 oclock    Family History  Problem Relation Age of Onset  . Heart disease Father     CAD  . Breast cancer Maternal Aunt   . Coronary artery disease Mother   . Diabetes type II Mother     Social History History  Substance Use Topics  . Smoking status: Current Every Day Smoker -- 2.00 packs/day for 34 years    Types: Cigarettes  . Smokeless tobacco: Never Used  . Alcohol Use: No    Allergies  Allergen Reactions  . Amoxicillin-Pot  Clavulanate     Vomiting   . Azithromycin     unknown  . Cefprozil     Vomiting   . Eggs Or Egg-Derived Products     Hives   . Esomeprazole Magnesium     unknown  . Montelukast Sodium     unknown  . Omeprazole     unknown  . Penicillins     Vomiting blood  . Prednisone     Rapid heart beat & difficulty breathing  . Spiriva (Tiotropium Bromide Monohydrate)     HA    Current Outpatient Prescriptions  Medication Sig Dispense Refill  . albuterol (PROAIR HFA) 108 (90 BASE) MCG/ACT inhaler Inhale 2 puffs into the lungs every 6 (six) hours as needed.        . ALPRAZolam (XANAX) 0.5 MG tablet Take 0.5 mg by mouth at bedtime as needed.        Marland Kitchen aspirin 81 MG EC tablet Take 81 mg by mouth daily.        . clopidogrel (PLAVIX) 75 MG tablet Take 1 tablet (75 mg total) by mouth daily.  90 tablet  4  . nebivolol (BYSTOLIC) 10 MG tablet Take 1 tablet (10 mg total) by mouth daily.  90 tablet  3  .  nitroGLYCERIN (NITROSTAT) 0.4 MG SL tablet Place 1 tablet (0.4 mg total) under the tongue every 5 (five) minutes as needed.  25 tablet  6  . ranolazine (RANEXA) 1000 MG SR tablet Take 1 tablet (1,000 mg total) by mouth 2 (two) times daily.      . rosuvastatin (CRESTOR) 10 MG tablet Take 1 tablet (10 mg total) by mouth daily.  30 tablet  11   No current facility-administered medications for this visit.    Review of Systems Review of Systems  Constitutional: Negative.   Respiratory: Negative.   Cardiovascular: Negative.     Blood pressure 140/72, pulse 72, resp. rate 14, height 5' 0.5" (1.537 m), weight 220 lb (99.791 kg).  Physical Exam Physical Exam  Constitutional: She is oriented to person, place, and time. She appears well-developed and well-nourished.  Pulmonary/Chest: Right breast exhibits no inverted nipple, no mass, no nipple discharge, no skin change and no tenderness.  Lymphadenopathy:    She has no cervical adenopathy.    She has no axillary adenopathy.  Neurological: She is  alert and oriented to person, place, and time.  Skin: Skin is warm and dry.    Data Reviewed [place] of right breast UOQ shows no abnormality today.  Assessment    Resolved benign right breast masses     Plan    3 mo f/u with right mammogram.        SANKAR,SEEPLAPUTHUR G 09/12/2012, 9:13 PM

## 2012-09-11 NOTE — Progress Notes (Signed)
The patient has been asked to return to the office in three months for a unilateral right breast diagnostic mammogram.

## 2012-09-12 ENCOUNTER — Encounter: Payer: Self-pay | Admitting: General Surgery

## 2012-09-12 DIAGNOSIS — N63 Unspecified lump in unspecified breast: Secondary | ICD-10-CM | POA: Insufficient documentation

## 2012-09-12 DIAGNOSIS — D249 Benign neoplasm of unspecified breast: Secondary | ICD-10-CM | POA: Insufficient documentation

## 2012-09-16 ENCOUNTER — Encounter: Payer: Self-pay | Admitting: Pulmonary Disease

## 2012-09-17 ENCOUNTER — Telehealth: Payer: Self-pay | Admitting: *Deleted

## 2012-09-17 ENCOUNTER — Ambulatory Visit: Payer: Self-pay | Admitting: General Surgery

## 2012-09-17 DIAGNOSIS — G4733 Obstructive sleep apnea (adult) (pediatric): Secondary | ICD-10-CM

## 2012-09-17 NOTE — Telephone Encounter (Signed)
LMTCB

## 2012-09-17 NOTE — Telephone Encounter (Signed)
Pt returned call. Barbara Bowen °

## 2012-09-17 NOTE — Telephone Encounter (Signed)
Message copied by Christen Butter on Wed Sep 17, 2012 10:19 AM ------      Message from: Lupita Leash      Created: Tue Sep 16, 2012  6:02 PM       L,            Please let her know that I've seen the results of her 2012 sleep study. It showed that she did not have the type of sleep apnea that responds to CPAP.              I suggest that she have another test in [place] with Dr. Shelle Iron.  If she does not want to do that, then she should have a visit with a dedicated sleep specialist (one of our docs preferred).            Thanks,      Kipp Brood ------

## 2012-09-17 NOTE — Telephone Encounter (Signed)
I spoke with the pt and notified her of the below recs per BQ She verbalized understanding She states that due to her having TIA she is unable to travel to [place] Her spouse is going through chemo tx and is not able to take her either So, she can not come here to have repeat study OR see [facility] Please advise thanks

## 2012-09-18 NOTE — Telephone Encounter (Signed)
Let's see if we can set her up with an Resmed autotitrating device set between 5-20cm H20 with a download to me in 3 weeks.  She will need to have 2 L bleed in with that.  Please remind her to cut back on caffeine

## 2012-09-19 NOTE — Telephone Encounter (Signed)
Returning call can be reached at [phone].Barbara Bowen

## 2012-09-19 NOTE — Telephone Encounter (Signed)
LMTCB for the pt 

## 2012-09-19 NOTE — Telephone Encounter (Signed)
Spoke with pt and she agrees to start the CPAP therapy  Already est with [employer] for o2  Order was sent to [facility]

## 2012-09-21 ENCOUNTER — Ambulatory Visit: Payer: Self-pay | Admitting: Hematology and Oncology

## 2012-12-10 ENCOUNTER — Ambulatory Visit: Payer: Medicaid Other | Admitting: General Surgery

## 2012-12-23 ENCOUNTER — Encounter: Payer: Self-pay | Admitting: *Deleted

## 2013-02-02 ENCOUNTER — Encounter: Payer: Self-pay | Admitting: Cardiovascular Disease

## 2013-02-02 ENCOUNTER — Ambulatory Visit (INDEPENDENT_AMBULATORY_CARE_PROVIDER_SITE_OTHER): Payer: Medicaid Other | Admitting: Cardiovascular Disease

## 2013-02-02 VITALS — BP 110/62 | HR 66 | Ht 60.0 in | Wt 217.2 lb

## 2013-02-02 DIAGNOSIS — E785 Hyperlipidemia, unspecified: Secondary | ICD-10-CM

## 2013-02-02 DIAGNOSIS — J449 Chronic obstructive pulmonary disease, unspecified: Secondary | ICD-10-CM

## 2013-02-02 DIAGNOSIS — R0789 Other chest pain: Secondary | ICD-10-CM

## 2013-02-02 DIAGNOSIS — R0602 Shortness of breath: Secondary | ICD-10-CM

## 2013-02-02 DIAGNOSIS — F17200 Nicotine dependence, unspecified, uncomplicated: Secondary | ICD-10-CM

## 2013-02-02 DIAGNOSIS — I2510 Atherosclerotic heart disease of native coronary artery without angina pectoris: Secondary | ICD-10-CM

## 2013-02-02 MED ORDER — NEBIVOLOL HCL 10 MG PO TABS: 10.0000 mg | ORAL_TABLET | Freq: Every day | ORAL | Status: DC

## 2013-02-02 MED ORDER — AZITHROMYCIN 250 MG PO TABS: ORAL_TABLET | ORAL | Status: DC

## 2013-02-02 MED ORDER — DEXTROMETHORPHAN HBR 15 MG/5ML PO SYRP: 10.0000 mL | ORAL_SOLUTION | Freq: Four times a day (QID) | ORAL | Status: DC | PRN

## 2013-02-02 MED ORDER — ROSUVASTATIN CALCIUM 40 MG PO TABS: 40.0000 mg | ORAL_TABLET | Freq: Every day | ORAL | Status: DC

## 2013-02-02 NOTE — Progress Notes (Signed)
Patient ID: Barbara Bowen, female    DOB: 1955/12/20, 57 y.o.   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   She continues to have  insomnia, continues to smoke 1-1/2 packs per day Palpitations are better on bystolic  She does report having occasional episodes of chest pain. Not as bad. Recently she has developed URI symptoms for the past 2 weeks. She is unable to sleep secondary to cough, significant sputum. She thinks her symptoms started after a flu shot  History of obstructive sleep apnea and only uses nasal cannula oxygen. She reports having sleep study x2 in the past. The second sleep study she did not sleep for very long. She currently sleeps 2 to 3 hours per night, wakes up at 2 AM.  no significant improvement In her sleep hygiene with nasal cannula oxygen She still wakes herself up snoring. She has seen neurology in the past with no insight into her sleep hygiene.  Cardiac catheterization from February 2012: Left main: Long. Anomalous origin off right cooronary cusp. Appears to run anterior to the aorta  30-40%  LAD:  mild luminal plaqure.. small mid intramyocardial bridge without flow limitation.  LCX:  small. made up primarily of an OM-1. 30-40% ostiall lesion   RCA: Large dominant vessel.Proximal stent widely patent. 40-50% mid after stent. mild plaquing distally.  LV: EF55% no regional wall motion abnormalities Stable CAD with anomalous left main and normal LV function.   EKG shows normal sinus rhythm with rate 66 beats per  minute, no significant ST or T wave changes.   Outpatient Encounter Prescriptions as of 02/02/2013  Medication Sig Dispense Refill  . albuterol (PROAIR HFA) 108 (90 BASE) MCG/ACT inhaler Inhale 2 puffs into the lungs every 6 (six) hours as needed.        . ALPRAZolam (XANAX) 0.5 MG tablet Take 0.5 mg by mouth at bedtime as needed.        Marland Kitchen aspirin 81 MG EC tablet Take 81 mg by mouth daily.        . clopidogrel (PLAVIX) 75 MG tablet Take 1 tablet (75 mg total) by mouth daily.  90 tablet  4  . nebivolol (BYSTOLIC) 10 MG tablet Take 1 tablet (10 mg total) by mouth daily.  90 tablet  3  . nitroGLYCERIN (NITROSTAT) 0.4 MG SL tablet Place 1 tablet (0.4 mg total) under the tongue every 5 (five) minutes as needed.  25 tablet  6  . ranolazine (RANEXA) 1000 MG SR tablet Take 1 tablet (1,000 mg total) by mouth 2 (two) times daily.      . rosuvastatin (CRESTOR) 10 MG tablet Take 1 tablet (10 mg total) by mouth daily.  30 tablet  11   No facility-administered encounter medications on file as of 02/02/2013.    Review of Systems  Constitutional: Negative.   HENT: Negative.   Eyes: Negative.   Respiratory: Positive for shortness of breath.   Cardiovascular: Positive for chest pain.  Gastrointestinal: Negative.   Endocrine: Negative.   Musculoskeletal: Negative.   Skin:  Negative.   Allergic/Immunologic: Negative.   Neurological: Negative.   Hematological: Negative.   Psychiatric/Behavioral: Positive for sleep disturbance.       Insomnia  All other systems reviewed and are negative.    BP 110/62  Pulse 66  Ht 5' (1.524 m)  Wt 217 lb 4 oz (98.544 kg)  BMI 42.43 kg/m2  Physical Exam  Nursing note and vitals reviewed. Constitutional: She is oriented to person, place, and time. She appears well-developed and well-nourished.  obese  HENT:  Head: Normocephalic.  Nose: Nose normal.  Mouth/Throat: Oropharynx is clear and moist.  Eyes: Conjunctivae are normal. Pupils are equal, round, and  reactive to light.  Neck: Normal range of motion. Neck supple. No JVD present.  Cardiovascular: Normal rate, regular rhythm, S1 normal, S2 normal and intact distal pulses.  Exam reveals no gallop and no friction rub.   Murmur heard.  Crescendo systolic murmur is present with a grade of 2/6  Pulmonary/Chest: Effort normal and breath sounds normal. No respiratory distress. She has no wheezes. She has no rales. She exhibits no tenderness.  Abdominal: Soft. Bowel sounds are normal. She exhibits no distension. There is no tenderness.  Musculoskeletal: Normal range of motion. She exhibits no edema and no tenderness.  Lymphadenopathy:    She has no cervical adenopathy.  Neurological: She is alert and oriented to person, place, and time. Coordination normal.  Skin: Skin is warm and dry. No rash noted. No erythema.  Psychiatric: She has a normal mood and affect. Her behavior is normal. Judgment and thought content normal.    Assessment and Plan

## 2013-02-02 NOTE — Assessment & Plan Note (Signed)
Currently with no significant symptoms of angina. No further workup at this time. Continue current medication regimen.

## 2013-02-02 NOTE — Patient Instructions (Signed)
Please take azithromycin 2 the first day, then one a day after that Take robitussin as needed for cough  Please call us if you have new issues that need to be addressed before your next appt.  Your physician wants you to follow-up in: 6 months.  You will receive a reminder letter in the mail two months in advance. If you don't receive a letter, please call our office to schedule the follow-up appointment.

## 2013-02-02 NOTE — Assessment & Plan Note (Signed)
Worsening shortness of breath with cough. Suspect she has COPD exacerbation. She is requesting an antibiotic and we will call in a Z-Pak

## 2013-02-02 NOTE — Assessment & Plan Note (Signed)
We have encouraged her to continue to work on weaning her cigarettes and smoking cessation. She will continue to work on this and does not want any assistance with chantix.  

## 2013-02-02 NOTE — Assessment & Plan Note (Signed)
Suspect she might have mild bronchitis. We will send in a Z-Pak, encouraged her to take Robitussin

## 2013-02-02 NOTE — Assessment & Plan Note (Signed)
Encouraged her to continue on her Crestor.

## 2013-04-23 HISTORY — PX: CORONARY ANGIOPLASTY: SHX604

## 2013-05-21 ENCOUNTER — Other Ambulatory Visit: Payer: Self-pay | Admitting: *Deleted

## 2013-05-21 ENCOUNTER — Other Ambulatory Visit: Payer: Self-pay | Admitting: Cardiovascular Disease

## 2013-05-21 MED ORDER — NEBIVOLOL HCL 10 MG PO TABS: 10.0000 mg | ORAL_TABLET | Freq: Every day | ORAL | Status: DC

## 2013-05-21 MED ORDER — CLOPIDOGREL BISULFATE 75 MG PO TABS: 75.0000 mg | ORAL_TABLET | Freq: Every day | ORAL | Status: DC

## 2013-05-21 NOTE — Telephone Encounter (Signed)
Patient wanting samples on both. Please call when ready

## 2013-05-21 NOTE — Telephone Encounter (Signed)
Requested Prescriptions   Signed Prescriptions Disp Refills  . clopidogrel (PLAVIX) 75 MG tablet 90 tablet 4    Sig: Take 1 tablet (75 mg total) by mouth daily.    Authorizing Provider: Minna Merritts    Ordering User: Eugenio Hoes, Sahara Fujimoto C  . nebivolol (BYSTOLIC) 10 MG tablet 30 tablet 3    Sig: Take 1 tablet (10 mg total) by mouth daily.    Authorizing Provider: Minna Merritts    Ordering User: Britt Bottom

## 2013-06-22 ENCOUNTER — Telehealth: Payer: Self-pay | Admitting: *Deleted

## 2013-06-22 NOTE — Telephone Encounter (Signed)
Patient has been approved for Bystolic 10 mg 1 tablet daily from 06/22/2013-06/23/2014.  Pt has failed 2 or more beta blockers metoprolol tartrate 50 mg, propranolol etc.

## 2013-08-03 ENCOUNTER — Encounter: Payer: Self-pay | Admitting: Cardiovascular Disease

## 2013-08-03 ENCOUNTER — Ambulatory Visit (INDEPENDENT_AMBULATORY_CARE_PROVIDER_SITE_OTHER): Payer: Medicaid Other | Admitting: Cardiovascular Disease

## 2013-08-03 VITALS — BP 120/62 | HR 72 | Ht 60.0 in | Wt 216.5 lb

## 2013-08-03 DIAGNOSIS — I6529 Occlusion and stenosis of unspecified carotid artery: Secondary | ICD-10-CM

## 2013-08-03 DIAGNOSIS — I2510 Atherosclerotic heart disease of native coronary artery without angina pectoris: Secondary | ICD-10-CM

## 2013-08-03 DIAGNOSIS — E785 Hyperlipidemia, unspecified: Secondary | ICD-10-CM

## 2013-08-03 DIAGNOSIS — R0602 Shortness of breath: Secondary | ICD-10-CM

## 2013-08-03 DIAGNOSIS — F17200 Nicotine dependence, unspecified, uncomplicated: Secondary | ICD-10-CM

## 2013-08-03 DIAGNOSIS — R002 Palpitations: Secondary | ICD-10-CM

## 2013-08-03 DIAGNOSIS — J449 Chronic obstructive pulmonary disease, unspecified: Secondary | ICD-10-CM

## 2013-08-03 MED ORDER — NITROGLYCERIN 0.4 MG SL SUBL: 0.4000 mg | SUBLINGUAL_TABLET | SUBLINGUAL | Status: DC | PRN

## 2013-08-03 NOTE — Assessment & Plan Note (Signed)
Encouraged her to stay on her Crestor. She is unable to tolerate Lipitor

## 2013-08-03 NOTE — Assessment & Plan Note (Signed)
Currently with no symptoms of angina. No further workup at this time. Continue current medication regimen. 

## 2013-08-03 NOTE — Assessment & Plan Note (Signed)
We have encouraged her to continue to work on weaning her cigarettes and smoking cessation. She will continue to work on this and does not want any assistance with chantix.  

## 2013-08-03 NOTE — Patient Instructions (Signed)
You are doing well. No medication changes were made.  We will schedule you for a carotid ultrasound for carotid stenosis  Please call us if you have new issues that need to be addressed before your next appt.  Your physician wants you to follow-up in: 6 months.  You will receive a reminder letter in the mail two months in advance. If you don't receive a letter, please call our office to schedule the follow-up appointment.

## 2013-08-03 NOTE — Progress Notes (Signed)
Patient ID: Barbara Bowen, female    DOB: March 18, 1956, 58 y.o.   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   She continues to have  insomnia, continues to smoke 1-1/2 packs per day Palpitations are better on bystolic she reports the pharmacy is giving her Lipitor, and not Crestor. She has been unable to tolerate Lipitor secondary to myalgias.  she does not feel as well on the Lipitor.   No recent chest pain symptoms   History of obstructive sleep apnea and only uses nasal cannula oxygen. She reports having sleep study x2 in the past. The second sleep study she did not sleep for very long. She currently sleeps 2 to 3 hours per night, wakes up at 2 AM.  no significant improvement In her sleep hygiene with nasal cannula oxygen She still wakes herself up snoring. She has seen neurology in the past with no insight into her sleep hygiene.  Cardiac catheterization from February 2012: Left main: Long. Anomalous origin off right cooronary cusp. Appears to run anterior to the aorta  30-40%  LAD:  mild luminal plaqure.. small mid intramyocardial bridge without flow limitation.  LCX:  small. made up primarily of an OM-1. 30-40% ostiall lesion   RCA: Large dominant vessel.Proximal stent widely patent. 40-50% mid after stent. mild plaquing distally.  LV: EF55% no regional wall motion abnormalities Stable CAD with anomalous left main and normal LV function.   EKG shows normal sinus rhythm with rate 72 beats per minute, no significant ST or T wave  changes.   Outpatient Encounter Prescriptions as of 08/03/2013  Medication Sig  . albuterol (PROAIR HFA) 108 (90 BASE) MCG/ACT inhaler Inhale 2 puffs into the lungs every 6 (six) hours as needed.    . ALPRAZolam (XANAX) 0.5 MG tablet Take 0.5 mg by mouth at bedtime as needed.    Marland Kitchen aspirin 81 MG EC tablet Take 81 mg by mouth daily.    Marland Kitchen azithromycin (ZITHROMAX) 250 MG tablet Take one daily  . clopidogrel (PLAVIX) 75 MG tablet TAKE 1 TABLET BY MOUTH EVERY DAY  . clopidogrel (PLAVIX) 75 MG tablet Take 1 tablet (75 mg total) by mouth daily.  Marland Kitchen dextromethorphan 15 MG/5ML syrup Take 10 mLs (30 mg total) by mouth 4 (four) times daily as needed for cough.  . nebivolol (BYSTOLIC) 10 MG tablet Take 1 tablet (10 mg total) by mouth daily.  . nitroGLYCERIN (NITROSTAT) 0.4 MG SL tablet Place 1 tablet (0.4 mg total) under the tongue every 5 (five) minutes as needed.  . ranolazine (RANEXA) 1000 MG SR tablet Take 1 tablet (1,000 mg total) by mouth 2 (two) times daily.  . rosuvastatin (CRESTOR) 40 MG tablet Take 1 tablet (40 mg total) by mouth daily.    Review of Systems  Constitutional: Negative.   HENT: Negative.   Eyes: Negative.   Respiratory: Positive for shortness of breath.   Gastrointestinal: Negative.   Endocrine: Negative.   Musculoskeletal: Negative.   Skin: Negative.   Allergic/Immunologic: Negative.   Neurological: Negative.   Hematological: Negative.  Psychiatric/Behavioral: Positive for sleep disturbance.       Insomnia  All other systems reviewed and are negative.   BP 120/62  Ht 5' (1.524 m)  Wt 216 lb 8 oz (98.204 kg)  BMI 42.28 kg/m2  Physical Exam  Nursing note and vitals reviewed. Constitutional: She is oriented to person, place, and time. She appears well-developed and well-nourished.  obese  HENT:  Head: Normocephalic.  Nose: Nose normal.  Mouth/Throat: Oropharynx is clear and moist.  Eyes: Conjunctivae are normal. Pupils are equal, round, and reactive to light.   Neck: Normal range of motion. Neck supple. No JVD present.  Cardiovascular: Normal rate, regular rhythm, S1 normal, S2 normal and intact distal pulses.  Exam reveals no gallop and no friction rub.   Murmur heard.  Crescendo systolic murmur is present with a grade of 2/6  Pulmonary/Chest: Effort normal and breath sounds normal. No respiratory distress. She has no wheezes. She has no rales. She exhibits no tenderness.  Abdominal: Soft. Bowel sounds are normal. She exhibits no distension. There is no tenderness.  Musculoskeletal: Normal range of motion. She exhibits no edema and no tenderness.  Lymphadenopathy:    She has no cervical adenopathy.  Neurological: She is alert and oriented to person, place, and time. Coordination normal.  Skin: Skin is warm and dry. No rash noted. No erythema.  Psychiatric: She has a normal mood and affect. Her behavior is normal. Judgment and thought content normal.    Assessment and Plan

## 2013-08-03 NOTE — Assessment & Plan Note (Signed)
Palpitations significantly improved on the beta blocker. No medication changes made

## 2013-08-03 NOTE — Assessment & Plan Note (Signed)
She is on oxygen at nighttime. Continues to smoke. Counseling given for smoking cessation

## 2013-08-03 NOTE — Assessment & Plan Note (Signed)
Chronic mild shortness of breath likely secondary to long history of smoking, obesity and deconditioning

## 2013-08-06 ENCOUNTER — Encounter (INDEPENDENT_AMBULATORY_CARE_PROVIDER_SITE_OTHER): Payer: Medicaid Other

## 2013-08-06 DIAGNOSIS — R42 Dizziness and giddiness: Secondary | ICD-10-CM

## 2013-08-06 DIAGNOSIS — I6529 Occlusion and stenosis of unspecified carotid artery: Secondary | ICD-10-CM

## 2013-09-25 ENCOUNTER — Telehealth: Payer: Self-pay

## 2013-09-25 NOTE — Telephone Encounter (Signed)
Spoke w/ pt's husband.  He states that pt is in the bed and he will have her call back when she gets up.

## 2013-09-25 NOTE — Telephone Encounter (Signed)
Spoke w/ pt.  She reports that she feels a "thumping" in her chest at night and can hear a "whoosh" when her HR drops. Pt has a BP monitor at home, but it is still unopened in the box and she does not know how to use it.  Instructed pt on how to monitor her HR manually.  Advised her to bring the monitor here or to her local pharmacy so that she may be instructed on how to use it.  She is agreeable and will record her readings to bring to her appt w/ Ignacia Bayley, NP on Tuesday, 09/29/13. Pt reports that her husband is concerned, as the last time she felt similar, she had PCI placement.  Pt verbalizes understanding to call 911 or seek medical attention if sx worsen, are associated w/ pain, SOB, nausea or diaphoresis.

## 2013-09-25 NOTE — Telephone Encounter (Signed)
Pt called and states she is having "irregular thumping" has been going on for a few weeks. States she has a little discomfort, but no pain. Please call.

## 2013-09-29 ENCOUNTER — Ambulatory Visit (INDEPENDENT_AMBULATORY_CARE_PROVIDER_SITE_OTHER): Payer: Medicaid Other | Admitting: Nurse Practitioner

## 2013-09-29 ENCOUNTER — Encounter: Payer: Self-pay | Admitting: Nurse Practitioner

## 2013-09-29 VITALS — BP 110/62 | HR 69 | Ht 60.5 in | Wt 216.8 lb

## 2013-09-29 DIAGNOSIS — F17200 Nicotine dependence, unspecified, uncomplicated: Secondary | ICD-10-CM

## 2013-09-29 DIAGNOSIS — Z720 Tobacco use: Secondary | ICD-10-CM

## 2013-09-29 DIAGNOSIS — R002 Palpitations: Secondary | ICD-10-CM

## 2013-09-29 DIAGNOSIS — I2510 Atherosclerotic heart disease of native coronary artery without angina pectoris: Secondary | ICD-10-CM

## 2013-09-29 NOTE — Patient Instructions (Signed)
Your physician recommends that you schedule a follow-up appointment in:  3-4 weeks with Dr. Rockey Situ   Your physician has requested that you have an echocardiogram. Echocardiography is a painless test that uses sound waves to create images of your heart. It provides your doctor with information about the size and shape of your heart and how well your heart's chambers and valves are working. This procedure takes approximately one hour. There are no restrictions for this procedure.   Your physician recommends that you have labs today: TSH  BMP Mag  Your physician has recommended that you wear a holter monitor. Holter monitors are medical devices that record the heart's electrical activity. Doctors most often use these monitors to diagnose arrhythmias. Arrhythmias are problems with the speed or rhythm of the heartbeat. The monitor is a small, portable device. You can wear one while you do your normal daily activities. This is usually used to diagnose what is causing palpitations/syncope (passing out).

## 2013-09-29 NOTE — Progress Notes (Signed)
Patient Name: Barbara Bowen Date of Encounter: 09/29/2013  Primary Care Provider:  Christianne Borrow, MD Primary Cardiologist:  Johnny Bridge, MD   Patient Profile  58 y/o female with h/o CAD, COPD, chronic dyspnea, and palpitations, who presents due to worsening palpitations.  Problem List   Past Medical History  Diagnosis Date  . Coronary artery disease     a. s/p PCI of pRCA 09/2007.  EF 60%;  b. Cath 2012 nonobs dzs.  Marland Kitchen Hypertension   . Hyperlipidemia   . Paroxysmal atrial fibrillation   . Hx-TIA (transient ischemic attack)   . Anxiety   . COPD (chronic obstructive pulmonary disease)   . OA (osteoarthritis)   . Tobacco use disorder     a. does not believe cigarettes contribute to her dyspnea/asthma.    . Enthesopathy of hip region   . Plantar fascial fibromatosis   . Proteinuria   . DM type 2 (diabetes mellitus, type 2)   . Unspecified tinnitus   . Insomnia, unspecified   . Unspecified sleep apnea     a. Wears O2 via [place] @ HS.  . Lipoma of other skin and subcutaneous tissue   . Leukocytosis, unspecified   . Palpitations   . Occlusion and stenosis of carotid artery without mention of cerebral infarction    Past Surgical History  Procedure Laterality Date  . Cardiac catheterization  2009    s/p right coronary artery drug-eluting stent  . Abdominal hysterectomy    . Appendectomy    . Exploratory thoracotomy    . Mole removal    . Breast surgery Right 06-09-2012    biopsy x2 9 oclock and 11 oclock    Allergies  Allergies  Allergen Reactions  . Amoxicillin-Pot Clavulanate     Vomiting   . Cefprozil     Vomiting   . Eggs Or Egg-Derived Products     Hives   . Esomeprazole Magnesium     unknown  . Montelukast Sodium     unknown  . Omeprazole     unknown  . Penicillins     Vomiting blood  . Prednisone     Rapid heart beat & difficulty breathing  . Spiriva [Tiotropium Bromide Monohydrate]     HA    HPI  58 y/o female with the above complex problem  list.  She was last seen by Dr. Rockey Situ earlier in the spring @ which time she was doing ok.  Over the past 2-3 wks, she has noticed tachypalpitations assoc with lightheadedness and dyspnea, occurring every time that she lays down, lasting < 10 mins, and resolving spontaneously.  She thinks that she also has palps when she is not lying down however says that those are less noticeable.  She continues to have chronic DOE and fatigue.  She says that she only sleeps 4-5 hrs/night and that this is chronic.  She denies pnd, orthopnea, n, v, syncope, or early satiety.  She sometimes has ankle/foot edema, if she's been sitting for prolonged periods.  She continues to smoke heavily.  Home Medications  Prior to Admission medications   Medication Sig Start Date End Date Taking? Authorizing Provider  albuterol (PROAIR HFA) 108 (90 BASE) MCG/ACT inhaler Inhale 2 puffs into the lungs every 6 (six) hours as needed.     Yes Historical Provider, MD  ALPRAZolam Duanne Moron) 0.5 MG tablet Take 0.5 mg by mouth at bedtime as needed.     Yes Historical Provider, MD  aspirin 81 MG EC tablet  Take 81 mg by mouth daily.     Yes Historical Provider, MD  dextromethorphan 15 MG/5ML syrup Take 10 mLs (30 mg total) by mouth 4 (four) times daily as needed for cough. 02/02/13  Yes Minna Merritts, MD  nebivolol (BYSTOLIC) 10 MG tablet Take 1 tablet (10 mg total) by mouth daily. 05/21/13  Yes Minna Merritts, MD  nitroGLYCERIN (NITROSTAT) 0.4 MG SL tablet Place 1 tablet (0.4 mg total) under the tongue every 5 (five) minutes as needed. 08/03/13  Yes Minna Merritts, MD  rosuvastatin (CRESTOR) 40 MG tablet Take 1 tablet (40 mg total) by mouth daily. 02/02/13  Yes Minna Merritts, MD    Review of Systems  Palpitations with dyspnea and lightheadedness as above.  Chronic insomnia with resultant fatigue during the day. Chronic DOE.  All other systems reviewed and are otherwise negative except as noted above.  Physical Exam  Blood pressure  110/62, pulse 69, height 5' 0.5" (1.537 m), weight 216 lb 12 oz (98.317 kg).  General: Pleasant, NAD Psych: Normal affect. Neuro: Alert and oriented X 3. Moves all extremities spontaneously. HEENT: Normal  Neck: Supple without bruits or JVD. Lungs:  Resp regular and unlabored, CTA. Heart: RRR no s3, s4.  Soft syst murmur @ bilat USB. Abdomen: Soft, non-tender, non-distended, BS + x 4.  Extremities: No clubbing, cyanosis or edema. DP/PT/Radials 2+ and equal bilaterally.  Accessory Clinical Findings  ECG - RSR, 69, LAD, LAFB, inflat st flattening/depression with t changes.  Assessment & Plan  1.  Tachypalpitations: she has a h/o palpitations and has been managed with bystolic up to this point.  Palps have worsened/become more frequent in the past few wks, assoc with dyspnea and lightheadedness.  She says that it has been several years since she has worn a monitor.  Will get 48 hr holter, echo, bmet, Mg, TSH.  She is not willing to try an alternate BB.  May need to consider switching to dilt depending on what is seen on monitor.  Will not change dose of bystolic as BP is relatively soft.  More recs pending studies.  2.  CAD:  No chest pain.  Chronic DOE, which is stable.  Checking echo as above.  Cont asa, bb, statin, prn nitrate.  3.  Tob Abuse:  Cessation advised.  She was not willing to discuss it and denied its contribution to her asthma and dyspnea.  4.  Dispo:  Labs, echo, holter.  F/U with Dr. Rockey Situ in 3-4 wks.    Rogelia Mire, NP 09/29/2013, 4:10 PM

## 2013-09-30 LAB — BASIC METABOLIC PANEL
BUN/Creatinine Ratio: 11 (ref 9–23)
BUN: 8 mg/dL (ref 6–24)
CO2: 24 mmol/L (ref 18–29)
Calcium: 9.4 mg/dL (ref 8.7–10.2)
Chloride: 98 mmol/L (ref 97–108)
Creatinine, Ser: 0.72 mg/dL (ref 0.57–1.00)
GFR calc Af Amer: 109 mL/min/{1.73_m2} (ref >59)
GFR, EST NON AFRICAN AMERICAN: 95 mL/min/{1.73_m2} (ref >59)
Glucose: 110 mg/dL — ABNORMAL HIGH (ref 65–99)
POTASSIUM: 4.5 mmol/L (ref 3.5–5.2)
SODIUM: 137 mmol/L (ref 134–144)

## 2013-09-30 LAB — TSH: TSH: 5.15 u[IU]/mL — ABNORMAL HIGH (ref 0.450–4.500)

## 2013-09-30 LAB — MAGNESIUM: Magnesium: 2 mg/dL (ref 1.6–2.6)

## 2013-10-06 ENCOUNTER — Other Ambulatory Visit: Payer: Self-pay

## 2013-10-06 ENCOUNTER — Telehealth: Payer: Self-pay | Admitting: *Deleted

## 2013-10-06 DIAGNOSIS — R7989 Other specified abnormal findings of blood chemistry: Secondary | ICD-10-CM

## 2013-10-06 NOTE — Telephone Encounter (Signed)
Spoke with Barbara Bowen and she mentioned pt was confused about monitor and was told that monitor would be mailed to her by the nurse. I spoke with pt and apologized if there was any miscommunication about the monitor and let her know that unfortunately we go through [place] to have monitors placed due to it being difficult to hook up. She said that she has a appointment to have monitor placed tomorrow and will be there. She was very understanding and mentioned that she was ok and she was fine with having monitor placed @ [facility]. She was only upset when she was told it would be mailed to her because she thought she was going to hook it up herself up.

## 2013-10-07 DIAGNOSIS — R002 Palpitations: Secondary | ICD-10-CM

## 2013-10-15 ENCOUNTER — Other Ambulatory Visit: Payer: Self-pay

## 2013-10-15 ENCOUNTER — Telehealth: Payer: Self-pay

## 2013-10-15 ENCOUNTER — Other Ambulatory Visit (INDEPENDENT_AMBULATORY_CARE_PROVIDER_SITE_OTHER): Payer: Medicaid Other

## 2013-10-15 DIAGNOSIS — R002 Palpitations: Secondary | ICD-10-CM

## 2013-10-15 DIAGNOSIS — R0602 Shortness of breath: Secondary | ICD-10-CM

## 2013-10-15 NOTE — Telephone Encounter (Signed)
Attempted to contact pt regarding holter results:  "NSR w/ PVCs (#1000) Rare, short SVT (3 beats)"  Left message for pt to call back.

## 2013-10-16 NOTE — Telephone Encounter (Signed)
Reviewed results w/ pt.  She will keep appt w/ Dr. Rockey Situ on 10/20/13.

## 2013-10-19 ENCOUNTER — Ambulatory Visit (INDEPENDENT_AMBULATORY_CARE_PROVIDER_SITE_OTHER): Payer: Medicaid Other

## 2013-10-19 ENCOUNTER — Other Ambulatory Visit: Payer: Self-pay

## 2013-10-19 DIAGNOSIS — R002 Palpitations: Secondary | ICD-10-CM

## 2013-10-20 ENCOUNTER — Encounter: Payer: Self-pay | Admitting: Cardiovascular Disease

## 2013-10-20 ENCOUNTER — Ambulatory Visit (INDEPENDENT_AMBULATORY_CARE_PROVIDER_SITE_OTHER): Payer: Medicaid Other | Admitting: Cardiovascular Disease

## 2013-10-20 VITALS — BP 130/72 | HR 61 | Ht 60.0 in | Wt 212.0 lb

## 2013-10-20 DIAGNOSIS — R7989 Other specified abnormal findings of blood chemistry: Secondary | ICD-10-CM

## 2013-10-20 DIAGNOSIS — R946 Abnormal results of thyroid function studies: Secondary | ICD-10-CM

## 2013-10-20 DIAGNOSIS — R002 Palpitations: Secondary | ICD-10-CM

## 2013-10-20 DIAGNOSIS — I25810 Atherosclerosis of coronary artery bypass graft(s) without angina pectoris: Secondary | ICD-10-CM

## 2013-10-20 DIAGNOSIS — F17200 Nicotine dependence, unspecified, uncomplicated: Secondary | ICD-10-CM

## 2013-10-20 DIAGNOSIS — J438 Other emphysema: Secondary | ICD-10-CM

## 2013-10-20 DIAGNOSIS — I2510 Atherosclerotic heart disease of native coronary artery without angina pectoris: Secondary | ICD-10-CM

## 2013-10-20 MED ORDER — CLOPIDOGREL BISULFATE 75 MG PO TABS: 75.0000 mg | ORAL_TABLET | Freq: Every day | ORAL | Status: DC

## 2013-10-20 NOTE — Assessment & Plan Note (Signed)
She uses the oxygen at nighttime and as needed. Continues to smoke

## 2013-10-20 NOTE — Assessment & Plan Note (Signed)
We have encouraged her to continue to work on weaning her cigarettes and smoking cessation. She will continue to work on this and does not want any assistance with chantix.  

## 2013-10-20 NOTE — Progress Notes (Signed)
Patient ID: Barbara Bowen, female    DOB: Oct 22, 1955, 58 y.o.   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   She continues to smoke 1-1/2 packs per day Since her last clinic visit, she complained of more palpitations. She had a 48-hour Holter monitor that showed frequent PVCs. She continues to take low-dose beta blocker in the evening. She is taking her Crestor inconsistently. She continues to use oxygen at nighttime No recent chest pain symptoms   History of obstructive sleep apnea and only uses nasal cannula oxygen. She reports having sleep study x2 in the past. The second sleep study she did not sleep for very long. She currently sleeps 2 to 3 hours per night, wakes up at 2 AM.  no significant improvement In her sleep hygiene with nasal cannula oxygen She still wakes herself up snoring. She has seen neurology in the past with no insight into her sleep hygiene.  Cardiac catheterization from February 2012: Left main: Long. Anomalous origin off right cooronary cusp. Appears to run anterior to the aorta  30-40%  LAD:  mild luminal plaqure.. small mid intramyocardial bridge without flow limitation.  LCX:  small. made up primarily of an OM-1. 30-40% ostiall lesion   RCA: Large dominant vessel.Proximal stent widely patent. 40-50% mid after stent. mild plaquing distally.  LV: EF55% no regional wall motion abnormalities Stable CAD with anomalous left main and normal LV function.   EKG shows normal sinus rhythm with rate 66 beats per minute, no  significant ST or T wave changes.   Outpatient Encounter Prescriptions as of 10/20/2013  Medication Sig  . albuterol (PROAIR HFA) 108 (90 BASE) MCG/ACT inhaler Inhale 2 puffs into the lungs every 6 (six) hours as needed.    . ALPRAZolam (XANAX) 0.5 MG tablet Take 0.5 mg by mouth at bedtime as needed.    Marland Kitchen aspirin 81 MG EC tablet Take 81 mg by mouth daily.    Marland Kitchen dextromethorphan 15 MG/5ML syrup Take 10 mLs (30 mg total) by mouth 4 (four) times daily as needed for cough.  . nebivolol (BYSTOLIC) 10 MG tablet Take 1 tablet (10 mg total) by mouth daily.  . nitroGLYCERIN (NITROSTAT) 0.4 MG SL tablet Place 1 tablet (0.4 mg total) under the tongue every 5 (five) minutes as needed.  . rosuvastatin (CRESTOR) 40 MG tablet Take 1 tablet (40 mg total) by mouth daily.    Review of Systems  Constitutional: Negative.   HENT: Negative.   Eyes: Negative.   Respiratory: Positive for shortness of breath.   Cardiovascular: Positive for palpitations.  Gastrointestinal: Negative.   Endocrine: Negative.   Musculoskeletal: Negative.   Skin: Negative.   Allergic/Immunologic: Negative.   Neurological: Negative.   Hematological: Negative.   Psychiatric/Behavioral: Positive for sleep disturbance.       Insomnia  All other systems reviewed and are negative.   BP 130/72  Pulse 61  Ht 5' (1.524 m)  Wt 212 lb (96.163 kg)  BMI 41.40 kg/m2  Physical Exam  Nursing note and vitals reviewed. Constitutional:  She is oriented to person, place, and time. She appears well-developed and well-nourished.  obese  HENT:  Head: Normocephalic.  Nose: Nose normal.  Mouth/Throat: Oropharynx is clear and moist.  Eyes: Conjunctivae are normal. Pupils are equal, round, and reactive to light.  Neck: Normal range of motion. Neck supple. No JVD present.  Cardiovascular: Normal rate, regular rhythm, S1 normal, S2 normal and intact distal pulses.  Exam reveals no gallop and no friction rub.   Murmur heard.  Crescendo systolic  murmur is present with a grade of 2/6  Pulmonary/Chest: Effort normal and breath sounds normal. No respiratory distress. She has no wheezes. She has no rales. She exhibits no tenderness.  Abdominal: Soft. Bowel sounds are normal. She exhibits no distension. There is no tenderness.  Musculoskeletal: Normal range of motion. She exhibits no edema and no tenderness.  Lymphadenopathy:    She has no cervical adenopathy.  Neurological: She is alert and oriented to person, place, and time. Coordination normal.  Skin: Skin is warm and dry. No rash noted. No erythema.  Psychiatric: She has a normal mood and affect. Her behavior is normal. Judgment and thought content normal.    Assessment and Plan

## 2013-10-20 NOTE — Assessment & Plan Note (Signed)
Suggested she stay on her beta blocker in the evening. She could take extra half dose his as needed for breakthrough palpitations. If she continues to have symptoms, antiarrhythmics could be used. Recent Holter monitor was reviewed showing PVCs

## 2013-10-20 NOTE — Assessment & Plan Note (Signed)
Currently with no symptoms of angina. No further workup at this time. Continue current medication regimen. 

## 2013-10-20 NOTE — Patient Instructions (Signed)
You are doing well. No medication changes were made.  Take extra 1/2 to whole bystolic for PVCs/palpitation  Ok to drink alcohol in moderation  Please call us if you have new issues that need to be addressed before your next appt.  Your physician wants you to follow-up in: 6 months.  You will receive a reminder letter in the mail two months in advance. If you don't receive a letter, please call our office to schedule the follow-up appointment.

## 2013-10-26 ENCOUNTER — Emergency Department: Payer: Self-pay | Admitting: Emergency Medicine

## 2013-10-26 LAB — BASIC METABOLIC PANEL
ANION GAP: 4 — AB (ref 7–16)
BUN: 8 mg/dL (ref 7–18)
CALCIUM: 8.6 mg/dL (ref 8.5–10.1)
CO2: 30 mmol/L (ref 21–32)
CREATININE: 0.91 mg/dL (ref 0.60–1.30)
Chloride: 104 mmol/L (ref 98–107)
EGFR (Non-African Amer.): >60
GLUCOSE: 203 mg/dL — AB (ref 65–99)
Osmolality: 280 (ref 275–301)
POTASSIUM: 3.7 mmol/L (ref 3.5–5.1)
Sodium: 138 mmol/L (ref 136–145)

## 2013-10-26 LAB — TROPONIN I: Troponin-I: <0.02 ng/mL

## 2013-10-26 LAB — CBC
HCT: 45 % (ref 35.0–47.0)
HGB: 15.1 g/dL (ref 12.0–16.0)
MCH: 33.5 pg (ref 26.0–34.0)
MCHC: 33.6 g/dL (ref 32.0–36.0)
MCV: 100 fL (ref 80–100)
Platelet: 233 10*3/uL (ref 150–440)
RBC: 4.51 10*6/uL (ref 3.80–5.20)
RDW: 13.4 % (ref 11.5–14.5)
WBC: 15.3 10*3/uL — ABNORMAL HIGH (ref 3.6–11.0)

## 2013-10-27 ENCOUNTER — Encounter: Payer: Self-pay | Admitting: Cardiovascular Disease

## 2013-10-27 ENCOUNTER — Ambulatory Visit (INDEPENDENT_AMBULATORY_CARE_PROVIDER_SITE_OTHER): Payer: Medicaid Other | Admitting: Cardiovascular Disease

## 2013-10-27 VITALS — BP 122/78 | HR 74 | Ht 60.0 in | Wt 218.0 lb

## 2013-10-27 DIAGNOSIS — R0602 Shortness of breath: Secondary | ICD-10-CM

## 2013-10-27 DIAGNOSIS — J438 Other emphysema: Secondary | ICD-10-CM

## 2013-10-27 DIAGNOSIS — E785 Hyperlipidemia, unspecified: Secondary | ICD-10-CM

## 2013-10-27 DIAGNOSIS — I2510 Atherosclerotic heart disease of native coronary artery without angina pectoris: Secondary | ICD-10-CM

## 2013-10-27 DIAGNOSIS — F17200 Nicotine dependence, unspecified, uncomplicated: Secondary | ICD-10-CM

## 2013-10-27 DIAGNOSIS — R079 Chest pain, unspecified: Secondary | ICD-10-CM

## 2013-10-27 MED ORDER — NITROGLYCERIN 0.4 MG SL SUBL: 0.4000 mg | SUBLINGUAL_TABLET | SUBLINGUAL | Status: DC | PRN

## 2013-10-27 MED ORDER — ISOSORBIDE DINITRATE 10 MG PO TABS: 10.0000 mg | ORAL_TABLET | Freq: Three times a day (TID) | ORAL | Status: DC | PRN

## 2013-10-27 NOTE — Progress Notes (Signed)
Patient ID: Barbara Bowen, female    DOB: Nov 25, 1955, 58 y.o.   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   She continues to smoke 1-1/2 packs per day In followup today, she reports having one month of stuttering, worsening anginal pain. Pain was severe on 09/26/2013 and she called EMS. She went to the emergency room. Cardiac enzymes were negative, EKG was relatively unchanged. She did not stay and follows up here today. Husband reports she has been having frequent episodes of chest pain sometimes with exertion, sometimes now at rest. Started one month ago. He suggested she seek help, she was reluctant to do so until just recently  In the past she had symptoms of palpitations. She had a 48-hour Holter monitor that showed frequent PVCs. She continues to take low-dose beta blocker in the evening. bystolic 10 mg daily She is taking her Crestor inconsistently. She continues to use oxygen at nighttime  History of obstructive sleep apnea and only uses nasal cannula oxygen. She reports having sleep study x2 in the past. The second sleep study she did not sleep for very long. She currently sleeps 2 to 3 hours per night, wakes up at 2 AM.  no significant improvement In her sleep hygiene with nasal cannula oxygen She still wakes herself up snoring. She has seen neurology in the past with no insight into her sleep hygiene.  Cardiac catheterization from February 2012: Left main: Long. Anomalous origin off right cooronary cusp. Appears to run anterior to  the aorta  30-40%  LAD:  mild luminal plaqure.. small mid intramyocardial bridge without flow limitation.  LCX:  small. made up primarily of an OM-1. 30-40% ostiall lesion   RCA: Large dominant vessel.Proximal stent widely patent. 40-50% mid after stent. mild plaquing distally.  LV: EF55% no regional wall motion abnormalities Stable CAD with anomalous left main and normal LV function.   EKG shows normal sinus rhythm with rate 74 beats per minute, T-wave abnormality in the anterolateral leads V4 through V6  Outpatient Encounter Prescriptions as of 10/27/2013  Medication Sig  . albuterol (PROAIR HFA) 108 (90 BASE) MCG/ACT inhaler Inhale 2 puffs into the lungs every 6 (six) hours as needed.    . ALPRAZolam (XANAX) 0.5 MG tablet Take 0.5 mg by mouth at bedtime as needed.    Marland Kitchen aspirin 81 MG EC tablet Take 81 mg by mouth daily.    . clopidogrel (PLAVIX) 75 MG tablet Take 1 tablet (75 mg total) by mouth daily.  Marland Kitchen dextromethorphan 15 MG/5ML syrup Take 10 mLs (30 mg total) by mouth 4 (four) times daily as needed for cough.  . nebivolol (BYSTOLIC) 10 MG tablet Take 1 tablet (10 mg total) by mouth daily.  . nitroGLYCERIN (NITROSTAT) 0.4 MG SL tablet Place 1 tablet (0.4 mg total) under the tongue every 5 (five) minutes as needed.  . rosuvastatin (CRESTOR) 40 MG tablet Take 1 tablet (40 mg total) by mouth daily.    Review of Systems  Constitutional: Negative.   HENT: Negative.   Eyes: Negative.  Respiratory: Positive for chest tightness and shortness of breath.   Cardiovascular: Positive for chest pain and palpitations.  Gastrointestinal: Negative.   Endocrine: Negative.   Musculoskeletal: Negative.   Skin: Negative.   Allergic/Immunologic: Negative.   Neurological: Negative.   Hematological: Negative.   Psychiatric/Behavioral: Positive for sleep disturbance.       Insomnia  All other systems reviewed and are negative.   BP 122/78  Pulse 74  Ht 5' (1.524 m)  Wt 218 lb (98.884 kg)  BMI  42.58 kg/m2  Physical Exam  Nursing note and vitals reviewed. Constitutional: She is oriented to person, place, and time. She appears well-developed and well-nourished.  obese  HENT:  Head: Normocephalic.  Nose: Nose normal.  Mouth/Throat: Oropharynx is clear and moist.  Eyes: Conjunctivae are normal. Pupils are equal, round, and reactive to light.  Neck: Normal range of motion. Neck supple. No JVD present.  Cardiovascular: Normal rate, regular rhythm, S1 normal, S2 normal and intact distal pulses.  Exam reveals no gallop and no friction rub.   Murmur heard.  Crescendo systolic murmur is present with a grade of 2/6  Pulmonary/Chest: Effort normal and breath sounds normal. No respiratory distress. She has no wheezes. She has no rales. She exhibits no tenderness.  Abdominal: Soft. Bowel sounds are normal. She exhibits no distension. There is no tenderness.  Musculoskeletal: Normal range of motion. She exhibits no edema and no tenderness.  Lymphadenopathy:    She has no cervical adenopathy.  Neurological: She is alert and oriented to person, place, and time. Coordination normal.  Skin: Skin is warm and dry. No rash noted. No erythema.  Psychiatric: She has a normal mood and affect. Her behavior is normal. Judgment and thought content normal.    Assessment and Plan

## 2013-10-27 NOTE — Patient Instructions (Addendum)
For your angina, Take NTG as needed (lay on the bed before taking) Start isosorbide three times a day.  Ok to cut the bystolic in 1/2   We will schedule a cardiac cath on Friday  Please call us if you have new issues that need to be addressed before your next appt.   [facility] Cardiac Cath Instructions   You are scheduled for a Cardiac Cath on:__Friday, July 10_______  Please arrive at __8:30__am on the day of your procedure  You will need to pre-register prior to the day of your procedure.  Enter through the [employer] at [facility].  Registration is the first desk on your right.  Please take the procedure order we have given you in order to be registered appropriately  Do not eat/drink anything after midnight  Someone will need to drive you home  It is recommended someone be with you for the first 24 hours after your procedure  Wear clothes that are easy to get on/off and wear slip on shoes if possible   Medications bring a current list of all medications with you  __X_ You may take all of your medications the morning of your procedure with enough water to swallow safely   Day of your procedure: Arrive at the [place] entrance.  Free valet service is available.  After entering the [place] please check-in at the registration desk (1st desk on your right) to receive your armband. After receiving your armband someone will escort you to the cardiac cath/special procedures waiting area.  The usual length of stay after your procedure is about 2 to 3 hours.  This can vary.  If you have any questions, please call our office at [phone], or you may call the cardiac cath lab at [facility] directly at [phone]

## 2013-10-27 NOTE — Assessment & Plan Note (Signed)
We have encouraged her to continue to work on weaning her cigarettes and smoking cessation. She will continue to work on this and does not want any assistance with chantix.  

## 2013-10-27 NOTE — Assessment & Plan Note (Signed)
She continues to smoke. Chronic shortness of breath. Smoking cessation recommended She is on pro-air

## 2013-10-27 NOTE — Assessment & Plan Note (Signed)
Total cholesterol 205, LDL 137, HDL 36. Labs from 10/17/2013 We will discuss her lipid regimen with her in followup Medication/statins noncompliance

## 2013-10-27 NOTE — Assessment & Plan Note (Addendum)
Worsening symptoms of angina. Symptoms similar to before her prior stent to the RCA in the past. Symptoms relieved with nitroglycerin, worse with exertion. We have suggested she stay on her high-dose aspirin and Plavix. We'll start isosorbide dinitrate 10 mg 3 times a day. She has a refill of her sublingual nitroglycerin. Cardiac catheterization scheduled for July 10 at 9:30.  Risk and benefit discussed with her including bruising, stroke, heart attack. She's willing to proceed  Labs will be drawn today in preparation for catheterization Recent ER results reviewed including chest x-ray, lab work, EKG  Percentage of stenosis in each vessel if known: 30-40% LAD and left circumflex, 40-50% RCA on prior cardiac catheterization  Ejection fraction from stress test or echo within the past 6 months: Normal ejection fraction  Result of any study/stress testing in the prior 6 months: No stress test within the past 6 months  Anginal status Including stable, unstable, non-STEMI, or anginal equivalent symptoms: Now with unstable angina  Anginal CCS of 1 through 4: 3  ACS, yes or no: No  NYHA classification if CHF history or in CHF: No CHF history

## 2013-10-28 ENCOUNTER — Inpatient Hospital Stay: Payer: Self-pay | Admitting: Student

## 2013-10-28 DIAGNOSIS — I200 Unstable angina: Secondary | ICD-10-CM

## 2013-10-28 LAB — COMPREHENSIVE METABOLIC PANEL
ALBUMIN: 3.3 g/dL — AB (ref 3.4–5.0)
Alkaline Phosphatase: 102 U/L
Anion Gap: 9 (ref 7–16)
BILIRUBIN TOTAL: 0.3 mg/dL (ref 0.2–1.0)
BUN: 10 mg/dL (ref 7–18)
CALCIUM: 8.7 mg/dL (ref 8.5–10.1)
CREATININE: 0.92 mg/dL (ref 0.60–1.30)
Chloride: 102 mmol/L (ref 98–107)
Co2: 27 mmol/L (ref 21–32)
EGFR (African American): >60
EGFR (Non-African Amer.): >60
Glucose: 148 mg/dL — ABNORMAL HIGH (ref 65–99)
OSMOLALITY: 277 (ref 275–301)
Potassium: 3.9 mmol/L (ref 3.5–5.1)
SGOT(AST): 19 U/L (ref 15–37)
SGPT (ALT): 24 U/L (ref 12–78)
SODIUM: 138 mmol/L (ref 136–145)
TOTAL PROTEIN: 7.6 g/dL (ref 6.4–8.2)

## 2013-10-28 LAB — PROTIME-INR
INR: 1 (ref 0.8–1.2)
INR: 0.9
PROTHROMBIN TIME: 9.9 s (ref 9.1–12.0)
PROTHROMBIN TIME: 12.2 s (ref 11.5–14.7)

## 2013-10-28 LAB — CBC
HCT: 46.4 % (ref 35.0–47.0)
HGB: 15.2 g/dL (ref 12.0–16.0)
MCH: 32.5 pg (ref 26.0–34.0)
MCHC: 32.7 g/dL (ref 32.0–36.0)
MCV: 100 fL (ref 80–100)
PLATELETS: 216 10*3/uL (ref 150–440)
RBC: 4.66 10*6/uL (ref 3.80–5.20)
RDW: 13.5 % (ref 11.5–14.5)
WBC: 14.1 10*3/uL — AB (ref 3.6–11.0)

## 2013-10-28 LAB — URINALYSIS, COMPLETE
BILIRUBIN, UR: NEGATIVE
BLOOD: NEGATIVE
Bacteria: NONE SEEN
Glucose,UR: NEGATIVE mg/dL (ref 0–75)
KETONE: NEGATIVE
Leukocyte Esterase: NEGATIVE
Nitrite: NEGATIVE
Ph: 6 (ref 4.5–8.0)
Protein: NEGATIVE
RBC,UR: 3 /HPF (ref 0–5)
SPECIFIC GRAVITY: 1.02 (ref 1.003–1.030)
Squamous Epithelial: 8
WBC UR: 3 /HPF (ref 0–5)

## 2013-10-28 LAB — CK TOTAL AND CKMB (NOT AT ARMC)
CK, TOTAL: 86 U/L
CK, TOTAL: 67 U/L
CK-MB: 0.8 ng/mL (ref 0.5–3.6)
CK-MB: 2.2 ng/mL (ref 0.5–3.6)

## 2013-10-28 LAB — APTT
Activated PTT: 33 secs (ref 23.6–35.9)
Activated PTT: 42.2 secs — ABNORMAL HIGH (ref 23.6–35.9)

## 2013-10-28 LAB — TROPONIN I
Troponin-I: <0.02 ng/mL
Troponin-I: 0.08 ng/mL — ABNORMAL HIGH
Troponin-I: 0.14 ng/mL — ABNORMAL HIGH

## 2013-10-28 LAB — HEPARIN LEVEL (UNFRACTIONATED): ANTI-XA(UNFRACTIONATED): 0.11 [IU]/mL — AB (ref 0.30–0.70)

## 2013-10-29 DIAGNOSIS — F17200 Nicotine dependence, unspecified, uncomplicated: Secondary | ICD-10-CM

## 2013-10-29 DIAGNOSIS — I214 Non-ST elevation (NSTEMI) myocardial infarction: Secondary | ICD-10-CM

## 2013-10-29 DIAGNOSIS — J449 Chronic obstructive pulmonary disease, unspecified: Secondary | ICD-10-CM

## 2013-10-29 DIAGNOSIS — E785 Hyperlipidemia, unspecified: Secondary | ICD-10-CM

## 2013-10-29 DIAGNOSIS — I2510 Atherosclerotic heart disease of native coronary artery without angina pectoris: Secondary | ICD-10-CM

## 2013-10-29 LAB — CBC WITH DIFFERENTIAL/PLATELET
BASOS PCT: 0.6 %
Basophil #: 0.1 10*3/uL (ref 0.0–0.1)
EOS ABS: 0.4 10*3/uL (ref 0.0–0.7)
Eosinophil %: 2.5 %
HCT: 43.1 % (ref 35.0–47.0)
HGB: 14.5 g/dL (ref 12.0–16.0)
Lymphocyte #: 5.5 10*3/uL — ABNORMAL HIGH (ref 1.0–3.6)
Lymphocyte %: 38.4 %
MCH: 34.2 pg — AB (ref 26.0–34.0)
MCHC: 33.6 g/dL (ref 32.0–36.0)
MCV: 102 fL — AB (ref 80–100)
MONO ABS: 1 x10 3/mm — AB (ref 0.2–0.9)
Monocyte %: 7.3 %
Neutrophil #: 7.3 10*3/uL — ABNORMAL HIGH (ref 1.4–6.5)
Neutrophil %: 51.2 %
PLATELETS: 203 10*3/uL (ref 150–440)
RBC: 4.23 10*6/uL (ref 3.80–5.20)
RDW: 13.4 % (ref 11.5–14.5)
WBC: 14.2 10*3/uL — ABNORMAL HIGH (ref 3.6–11.0)

## 2013-10-29 LAB — LIPID PANEL
Cholesterol: 168 mg/dL (ref 0–200)
HDL Cholesterol: 35 mg/dL — ABNORMAL LOW (ref 40–60)
LDL CHOLESTEROL, CALC: 103 mg/dL — AB (ref 0–100)
TRIGLYCERIDES: 151 mg/dL (ref 0–200)
VLDL Cholesterol, Calc: 30 mg/dL (ref 5–40)

## 2013-10-29 LAB — BASIC METABOLIC PANEL
Anion Gap: 6 — ABNORMAL LOW (ref 7–16)
BUN: 8 mg/dL (ref 7–18)
CALCIUM: 8.5 mg/dL (ref 8.5–10.1)
CO2: 27 mmol/L (ref 21–32)
Chloride: 107 mmol/L (ref 98–107)
Creatinine: 0.76 mg/dL (ref 0.60–1.30)
EGFR (African American): >60
EGFR (Non-African Amer.): >60
Glucose: 118 mg/dL — ABNORMAL HIGH (ref 65–99)
Osmolality: 279 (ref 275–301)
POTASSIUM: 4.1 mmol/L (ref 3.5–5.1)
Sodium: 140 mmol/L (ref 136–145)

## 2013-10-29 LAB — MAGNESIUM: MAGNESIUM: 2.1 mg/dL

## 2013-10-29 LAB — TSH: THYROID STIMULATING HORM: 3.35 u[IU]/mL

## 2013-10-29 LAB — HEPARIN LEVEL (UNFRACTIONATED): ANTI-XA(UNFRACTIONATED): 0.4 [IU]/mL (ref 0.30–0.70)

## 2013-10-29 LAB — CK TOTAL AND CKMB (NOT AT ARMC)
CK, Total: 81 U/L
CK-MB: 2.7 ng/mL (ref 0.5–3.6)

## 2013-10-29 LAB — HEMOGLOBIN A1C: Hemoglobin A1C: 6.9 % — ABNORMAL HIGH (ref 4.2–6.3)

## 2013-10-30 ENCOUNTER — Telehealth: Payer: Self-pay

## 2013-10-30 DIAGNOSIS — J449 Chronic obstructive pulmonary disease, unspecified: Secondary | ICD-10-CM

## 2013-10-30 DIAGNOSIS — I2510 Atherosclerotic heart disease of native coronary artery without angina pectoris: Secondary | ICD-10-CM

## 2013-10-30 DIAGNOSIS — I252 Old myocardial infarction: Secondary | ICD-10-CM | POA: Insufficient documentation

## 2013-10-30 DIAGNOSIS — F17200 Nicotine dependence, unspecified, uncomplicated: Secondary | ICD-10-CM

## 2013-10-30 LAB — BASIC METABOLIC PANEL
Anion Gap: 7 (ref 7–16)
BUN: 7 mg/dL (ref 7–18)
CHLORIDE: 104 mmol/L (ref 98–107)
CREATININE: 0.76 mg/dL (ref 0.60–1.30)
Calcium, Total: 8.7 mg/dL (ref 8.5–10.1)
Co2: 27 mmol/L (ref 21–32)
EGFR (Non-African Amer.): >60
GLUCOSE: 129 mg/dL — AB (ref 65–99)
OSMOLALITY: 275 (ref 275–301)
POTASSIUM: 4 mmol/L (ref 3.5–5.1)
Sodium: 138 mmol/L (ref 136–145)

## 2013-10-30 LAB — CBC WITH DIFFERENTIAL/PLATELET
Basophil #: 0.1 10*3/uL (ref 0.0–0.1)
Basophil %: 0.5 %
Eosinophil #: 0.3 10*3/uL (ref 0.0–0.7)
Eosinophil %: 2.2 %
HCT: 42.7 % (ref 35.0–47.0)
HGB: 14.1 g/dL (ref 12.0–16.0)
LYMPHS ABS: 3.2 10*3/uL (ref 1.0–3.6)
Lymphocyte %: 27 %
MCH: 33.8 pg (ref 26.0–34.0)
MCHC: 33.1 g/dL (ref 32.0–36.0)
MCV: 102 fL — ABNORMAL HIGH (ref 80–100)
MONO ABS: 1 x10 3/mm — AB (ref 0.2–0.9)
Monocyte %: 8.2 %
Neutrophil #: 7.4 10*3/uL — ABNORMAL HIGH (ref 1.4–6.5)
Neutrophil %: 62.1 %
Platelet: 201 10*3/uL (ref 150–440)
RBC: 4.18 10*6/uL (ref 3.80–5.20)
RDW: 13.4 % (ref 11.5–14.5)
WBC: 11.9 10*3/uL — AB (ref 3.6–11.0)

## 2013-10-30 NOTE — Telephone Encounter (Signed)
Patient contacted regarding discharge from [facility] on 10/30/13.  Patient understands to follow up with Dr. Rockey Situ on 11/03/13 at 2:00 at [facility]. Patient understands discharge instructions? yes Patient understands medications and regiment? yes Patient understands to bring all medications to this visit? yes

## 2013-11-03 ENCOUNTER — Encounter: Payer: Self-pay | Admitting: Cardiovascular Disease

## 2013-11-03 ENCOUNTER — Ambulatory Visit (INDEPENDENT_AMBULATORY_CARE_PROVIDER_SITE_OTHER): Payer: Medicaid Other | Admitting: Cardiovascular Disease

## 2013-11-03 VITALS — BP 130/84 | HR 77 | Ht 60.0 in | Wt 215.0 lb

## 2013-11-03 DIAGNOSIS — G4733 Obstructive sleep apnea (adult) (pediatric): Secondary | ICD-10-CM

## 2013-11-03 DIAGNOSIS — J438 Other emphysema: Secondary | ICD-10-CM

## 2013-11-03 DIAGNOSIS — I2510 Atherosclerotic heart disease of native coronary artery without angina pectoris: Secondary | ICD-10-CM

## 2013-11-03 DIAGNOSIS — R079 Chest pain, unspecified: Secondary | ICD-10-CM

## 2013-11-03 DIAGNOSIS — F17200 Nicotine dependence, unspecified, uncomplicated: Secondary | ICD-10-CM

## 2013-11-03 DIAGNOSIS — R0602 Shortness of breath: Secondary | ICD-10-CM

## 2013-11-03 DIAGNOSIS — E785 Hyperlipidemia, unspecified: Secondary | ICD-10-CM

## 2013-11-03 NOTE — Progress Notes (Signed)
Patient ID: Barbara Bowen, female    DOB: Dec 31, 1955, 58 y.o.   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  On her last office visit she was having unstable angina and was set up for a catheterization. She presented to the emergency room again the next day with chest pain. She underwent cardiac catheterization that showed 95% distal RCA disease. With DES placed. 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 Stent size was 3.0 x 23 mm stent   She continues to smoke 1-1/2 packs per day In followup today she recovered well from her catheterization. She continues to have insomnia. He snores heavily Laboratory the hospital 10/29/2013 showed total cholesterol 168, LDL 103, HDL 35  In the past she had symptoms of palpitations. She had a 48-hour Holter monitor that showed frequent PVCs. She continues to take low-dose beta blocker in the evening. bystolic 10 mg daily She is taking her Crestor inconsistently. She continues to use oxygen at nighttime  History of obstructive sleep apnea and only uses nasal cannula oxygen. She reports having sleep study x2 in the past. The second sleep study she did not sleep for very long. She currently sleeps 2 to 3 hours per night, wakes up at 2 AM.  no significant improvement In her sleep hygiene with nasal cannula oxygen She still wakes herself up snoring. She has seen neurology in the past with no insight into her  sleep hygiene.  Cardiac catheterization from February 2012: Left main: Long. Anomalous origin off right cooronary cusp. Appears to run anterior to the aorta  30-40%  LAD:  mild luminal plaqure.. small mid intramyocardial bridge without flow limitation.  LCX:  small. made up primarily of an OM-1. 30-40% ostiall lesion   RCA: Large dominant vessel.Proximal stent widely patent. 40-50% mid after stent. mild plaquing distally.  LV: EF55% no regional wall motion abnormalities Stable CAD with anomalous left main and normal LV function.   EKG shows normal sinus rhythm with rate 77 beats per minute, T-wave abnormality in the anterolateral leads 3 and aVF   Outpatient Encounter Prescriptions as of 11/03/2013  Medication Sig  . albuterol (PROAIR HFA) 108 (90 BASE) MCG/ACT inhaler Inhale 2 puffs into the lungs every 6 (six) hours as needed.    . ALPRAZolam (XANAX) 0.5 MG tablet Take 0.5 mg by mouth at bedtime as needed.    Marland Kitchen aspirin 81 MG EC tablet Take 81 mg by mouth daily.    . clopidogrel (PLAVIX) 75 MG tablet Take 1 tablet (75 mg total) by mouth daily.  Marland Kitchen dextromethorphan 15 MG/5ML syrup Take 10 mLs (30 mg total) by mouth 4 (four) times daily as needed for cough.  . isosorbide dinitrate (ISORDIL) 10 MG tablet Take 1 tablet (10 mg total) by mouth 3 (three) times daily as needed.  . nebivolol (BYSTOLIC) 10 MG tablet Take 1 tablet (10 mg total) by mouth daily.  . nitroGLYCERIN (NITROSTAT) 0.4 MG SL tablet Place  1 tablet (0.4 mg total) under the tongue every 5 (five) minutes as needed.  . rosuvastatin (CRESTOR) 40 MG tablet Take 1 tablet (40 mg total) by mouth daily.     Review of Systems  Constitutional: Negative.   HENT: Negative.   Eyes: Negative.   Respiratory: Positive for shortness of breath.   Cardiovascular: Negative.   Gastrointestinal: Negative.   Endocrine: Negative.   Musculoskeletal: Negative.   Skin: Negative.   Allergic/Immunologic: Negative.   Neurological: Negative.    Hematological: Negative.   Psychiatric/Behavioral: Positive for sleep disturbance.    BP 130/84  Pulse 77  Ht 5' (1.524 m)  Wt 215 lb (97.523 kg)  BMI 41.99 kg/m2  Physical Exam  Nursing note and vitals reviewed. Constitutional: She is oriented to person, place, and time. She appears well-developed and well-nourished.  HENT:  Head: Normocephalic.  Nose: Nose normal.  Mouth/Throat: Oropharynx is clear and moist.  Eyes: Conjunctivae are normal. Pupils are equal, round, and reactive to light.  Neck: Normal range of motion. Neck supple. No JVD present.  Cardiovascular: Normal rate, regular rhythm, S1 normal, S2 normal, normal heart sounds and intact distal pulses.  Exam reveals no gallop and no friction rub.   No murmur heard. Pulmonary/Chest: Effort normal and breath sounds normal. No respiratory distress. She has no wheezes. She has no rales. She exhibits no tenderness.  Abdominal: Soft. Bowel sounds are normal. She exhibits no distension. There is no tenderness.  Musculoskeletal: Normal range of motion. She exhibits no edema and no tenderness.  Lymphadenopathy:    She has no cervical adenopathy.  Neurological: She is alert and oriented to person, place, and time. Coordination normal.  Skin: Skin is warm and dry. No rash noted. No erythema.  Psychiatric: She has a normal mood and affect. Her behavior is normal. Judgment and thought content normal.    Assessment and Plan

## 2013-11-03 NOTE — Assessment & Plan Note (Signed)
Recent stent placement to the distal RCA. Angioplasty to the PDA. Symptoms have resolved. Encouraged smoking cessation

## 2013-11-03 NOTE — Patient Instructions (Signed)
You are doing well. No medication changes were made.  Please call us if you have new issues that need to be addressed before your next appt.  Your physician wants you to follow-up in: 6 months.  You will receive a reminder letter in the mail two months in advance. If you don't receive a letter, please call our office to schedule the follow-up appointment.   

## 2013-11-03 NOTE — Assessment & Plan Note (Signed)
Recommended smoking cessation. Continued shortness of breath with exertion 

## 2013-11-03 NOTE — Assessment & Plan Note (Signed)
We have encouraged her to continue to work on weaning her cigarettes and smoking cessation. She will continue to work on this and does not want any assistance with chantix.  

## 2013-11-03 NOTE — Assessment & Plan Note (Signed)
She continues to have insomnia. She would like further workup. We have suggested she talk to primary care

## 2013-11-03 NOTE — Assessment & Plan Note (Signed)
Encouraged weight loss, continue Crestor

## 2013-11-04 ENCOUNTER — Encounter: Payer: Self-pay | Admitting: Cardiovascular Disease

## 2013-11-05 ENCOUNTER — Other Ambulatory Visit: Payer: Self-pay | Admitting: Cardiovascular Disease

## 2013-11-05 ENCOUNTER — Telehealth: Payer: Self-pay

## 2013-11-05 NOTE — Telephone Encounter (Signed)
Left message for pt to call back regarding paperwork that pt asked to have completed.

## 2013-11-05 NOTE — Telephone Encounter (Signed)
Spoke w/ pt.  Advised her that I have paperwork that she asked to be completed, but only have page 2 of 4.  Asked her to drop off remainder of paperwork, as there is no indication of where to send this.  She will have her husband drop this off later today.

## 2013-12-02 ENCOUNTER — Ambulatory Visit: Payer: Self-pay | Admitting: Family Medicine

## 2013-12-11 ENCOUNTER — Encounter: Payer: Self-pay | Admitting: Podiatry

## 2013-12-11 ENCOUNTER — Ambulatory Visit (INDEPENDENT_AMBULATORY_CARE_PROVIDER_SITE_OTHER): Payer: Medicaid Other | Admitting: Podiatry

## 2013-12-11 ENCOUNTER — Ambulatory Visit (INDEPENDENT_AMBULATORY_CARE_PROVIDER_SITE_OTHER): Payer: Medicaid Other

## 2013-12-11 VITALS — BP 120/76 | HR 77 | Resp 16 | Ht 60.0 in | Wt 219.0 lb

## 2013-12-11 DIAGNOSIS — R609 Edema, unspecified: Secondary | ICD-10-CM | POA: Diagnosis not present

## 2013-12-11 DIAGNOSIS — S93609A Unspecified sprain of unspecified foot, initial encounter: Secondary | ICD-10-CM

## 2013-12-11 DIAGNOSIS — M779 Enthesopathy, unspecified: Secondary | ICD-10-CM

## 2013-12-13 NOTE — Progress Notes (Signed)
Subjective:     Patient ID: Barbara Bowen, female   DOB: 06-28-1955, 58 y.o.   MRN: [id number]  Foot Pain   patient states that I dropped a oxygen tank and then bumped my right fifth toe several weeks ago and I'm very worried I broke it   Review of Systems  All other systems reviewed and are negative.      Objective:   Physical Exam  Nursing note and vitals reviewed. Constitutional: She is oriented to person, place, and time.  Cardiovascular: Intact distal pulses.   Musculoskeletal: Normal range of motion.  Neurological: She is oriented to person, place, and time.  Skin: Skin is warm and dry.   neurovascular status found to be intact with muscle strength adequate in range of motion within normal limits. Patient is noted to have edema around the fifth toe right with inflammation noted with no proximal pain noted. Patient's digits are well-perfused and she is found to have moderate diminishment of arch height and she is well oriented x3     Assessment:     Possibility for fracture fifth toe right versus contusion    Plan:     H&P and x-rays reviewed. At this point I have recommended wider-type shoes in open shoes as much as possible and that uneventful healing should occur

## 2013-12-22 ENCOUNTER — Ambulatory Visit: Payer: Self-pay | Admitting: Family Medicine

## 2013-12-22 LAB — CBC CANCER CENTER
BASOS ABS: 0.1 x10 3/mm (ref 0.0–0.1)
Basophil %: 0.4 %
Eosinophil #: 0.3 x10 3/mm (ref 0.0–0.7)
Eosinophil %: 2.5 %
HCT: 45.7 % (ref 35.0–47.0)
HGB: 15.3 g/dL (ref 12.0–16.0)
Lymphocyte #: 4.3 x10 3/mm — ABNORMAL HIGH (ref 1.0–3.6)
Lymphocyte %: 34.5 %
MCH: 33.7 pg (ref 26.0–34.0)
MCHC: 33.5 g/dL (ref 32.0–36.0)
MCV: 101 fL — ABNORMAL HIGH (ref 80–100)
MONO ABS: 1 x10 3/mm — AB (ref 0.2–0.9)
Monocyte %: 7.9 %
NEUTROS ABS: 6.8 x10 3/mm — AB (ref 1.4–6.5)
NEUTROS PCT: 54.7 %
PLATELETS: 238 x10 3/mm (ref 150–440)
RBC: 4.54 10*6/uL (ref 3.80–5.20)
RDW: 13.6 % (ref 11.5–14.5)
WBC: 12.4 x10 3/mm — ABNORMAL HIGH (ref 3.6–11.0)

## 2014-01-13 ENCOUNTER — Telehealth: Payer: Self-pay

## 2014-01-13 NOTE — Telephone Encounter (Signed)
Spoke w/ pt. Advised her that I have received paperwork that she dropped off for her [employer], but it is pages 1, 3 & 4. She states that she dropped off page 2 approximately 2 months ago.  Advised her that I did not keep this paperwork, as it was incomplete.  Asked her to send the paperwork in it's entirety, as there is nothing for [place] to complete.  She is agreeable and will have her husband bring this in for completion.

## 2014-01-21 ENCOUNTER — Telehealth: Payer: Self-pay

## 2014-01-21 ENCOUNTER — Ambulatory Visit: Payer: Self-pay | Admitting: Family Medicine

## 2014-01-21 NOTE — Telephone Encounter (Signed)
Pt called, states she is having palpitations, SOB, very tired with anything she does.

## 2014-01-21 NOTE — Telephone Encounter (Signed)
Spoke w/ pt. She reports that she is having worsening SOBOE and would like to discuss w/ Dr. Rockey Situ about what to do next.  Pt sched to see Dr. Rockey Situ on Tues 01/26/14 to discuss.  Pt verbalizes understanding that it sx become emergent, to proceed to nearest ED or call 911.

## 2014-01-26 ENCOUNTER — Encounter: Payer: Self-pay | Admitting: Cardiovascular Disease

## 2014-01-26 ENCOUNTER — Ambulatory Visit (INDEPENDENT_AMBULATORY_CARE_PROVIDER_SITE_OTHER): Payer: Medicaid Other | Admitting: Cardiovascular Disease

## 2014-01-26 VITALS — BP 100/70 | HR 72 | Ht 60.5 in | Wt 212.0 lb

## 2014-01-26 DIAGNOSIS — Z720 Tobacco use: Secondary | ICD-10-CM

## 2014-01-26 DIAGNOSIS — R002 Palpitations: Secondary | ICD-10-CM

## 2014-01-26 DIAGNOSIS — E785 Hyperlipidemia, unspecified: Secondary | ICD-10-CM

## 2014-01-26 DIAGNOSIS — G4733 Obstructive sleep apnea (adult) (pediatric): Secondary | ICD-10-CM

## 2014-01-26 DIAGNOSIS — R0602 Shortness of breath: Secondary | ICD-10-CM

## 2014-01-26 DIAGNOSIS — F17200 Nicotine dependence, unspecified, uncomplicated: Secondary | ICD-10-CM

## 2014-01-26 DIAGNOSIS — I2510 Atherosclerotic heart disease of native coronary artery without angina pectoris: Secondary | ICD-10-CM

## 2014-01-26 MED ORDER — ROSUVASTATIN CALCIUM 40 MG PO TABS: 40.0000 mg | ORAL_TABLET | Freq: Every day | ORAL | Status: DC

## 2014-01-26 NOTE — Assessment & Plan Note (Signed)
History of PVCs. If symptoms get worse, could potentially use amiodarone.

## 2014-01-26 NOTE — Patient Instructions (Addendum)
You are doing well. No medication changes were made.  Please call us to schedule fasting labs at your convenience  Please call us if you have new issues that need to be addressed before your next appt.  Your physician wants you to follow-up in: 6 months.  You will receive a reminder letter in the mail two months in advance. If you don't receive a letter, please call our office to schedule the follow-up appointment.

## 2014-01-26 NOTE — Assessment & Plan Note (Signed)
We have encouraged her to continue to work on weaning her cigarettes and smoking cessation. She will continue to work on this and does not want any assistance with chantix.  

## 2014-01-26 NOTE — Assessment & Plan Note (Signed)
Currently with no symptoms of angina. No further workup at this time. Continue current medication regimen. 

## 2014-01-26 NOTE — Assessment & Plan Note (Signed)
Cholesterol above goal. Encouraged weight loss

## 2014-01-26 NOTE — Progress Notes (Signed)
Patient ID: Barbara Bowen, female    DOB: July 09, 1955, 58 y.o.   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 She has chronic insomnia, snores heavily  On her last office visit she was having unstable angina and was set up for a catheterization. She presented to the emergency room again the next day with chest pain. She underwent cardiac catheterization that showed 95% distal RCA disease. With DES placed. 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 Stent size was 3.0 x 23 mm stent   In followup today, She continues to smoke 1-1/2 packs per day  total cholesterol 168, LDL 103, HDL 35 She reports that she has periods of palpitations, they seem to cluster. Occasionally has dizziness when standing. She has chronic tinnitus. Your pressure when standing recently  Previous 48-hour Holter monitor that showed frequent PVCs. She continues to take low-dose beta blocker in the evening. bystolic 10 mg daily She is taking her Crestor inconsistently. She continues to use oxygen at nighttime  History of obstructive sleep apnea and only uses nasal cannula oxygen. She reports having sleep study x2 in the past. The second sleep study she did not sleep for very long. She currently sleeps 2 to 3 hours per night, wakes up at 2 AM.  no significant improvement In her sleep hygiene with nasal cannula oxygen She still wakes herself up snoring. She has seen neurology  in the past with no insight into her sleep hygiene.  Cardiac catheterization from February 2012: Left main: Long. Anomalous origin off right cooronary cusp. Appears to run anterior to the aorta  30-40%  LAD:  mild luminal plaqure.. small mid intramyocardial bridge without flow limitation.  LCX:  small. made up primarily of an OM-1. 30-40% ostiall lesion   RCA: Large dominant vessel.Proximal stent widely patent. 40-50% mid after stent. mild plaquing distally.  LV: EF55% no regional wall motion abnormalities Stable CAD with anomalous left main and normal LV function.   EKG shows normal sinus rhythm with rate 71 beats per minute, T-wave abnormality in the anterolateral leads 3 and aVF   Outpatient Encounter Prescriptions as of 01/26/2014  Medication Sig  . albuterol (PROAIR HFA) 108 (90 BASE) MCG/ACT inhaler Inhale 2 puffs into the lungs every 6 (six) hours as needed.    . ALPRAZolam (XANAX) 0.5 MG tablet Take 0.5 mg by mouth at bedtime as needed.    Marland Kitchen aspirin 81 MG EC tablet Take 81 mg by mouth daily.    . clopidogrel (PLAVIX) 75 MG tablet Take 1 tablet (75 mg total) by mouth daily.  . nebivolol (BYSTOLIC) 10 MG tablet Take 1 tablet (10 mg total) by mouth daily.  . nitroGLYCERIN (NITROSTAT) 0.4 MG SL tablet Place 1 tablet (0.4 mg total) under the tongue every 5 (five) minutes as needed.  . rosuvastatin (CRESTOR) 40 MG tablet Take 1 tablet (40 mg total) by mouth daily.    Review of Systems  Constitutional: Negative.  HENT: Negative.   Eyes: Negative.   Respiratory: Positive for shortness of breath.   Cardiovascular: Negative.   Gastrointestinal: Negative.   Endocrine: Negative.   Musculoskeletal: Negative.   Skin: Negative.   Allergic/Immunologic: Negative.   Neurological: Positive for dizziness.  Hematological: Negative.   Psychiatric/Behavioral: Positive for sleep disturbance.  All other systems reviewed and are negative.   BP 100/70  Pulse 72  Ht 5' 0.5" (1.537 m)  Wt 212  lb (96.163 kg)  BMI 40.71 kg/m2  Physical Exam  Nursing note and vitals reviewed. Constitutional: She is oriented to person, place, and time. She appears well-developed and well-nourished.  HENT:  Head: Normocephalic.  Nose: Nose normal.  Mouth/Throat: Oropharynx is clear and moist.  Eyes: Conjunctivae are normal. Pupils are equal, round, and reactive to light.  Neck: Normal range of motion. Neck supple. No JVD present.  Cardiovascular: Normal rate, regular rhythm, S1 normal, S2 normal, normal heart sounds and intact distal pulses.  Exam reveals no gallop and no friction rub.   No murmur heard. Pulmonary/Chest: Effort normal and breath sounds normal. No respiratory distress. She has no wheezes. She has no rales. She exhibits no tenderness.  Abdominal: Soft. Bowel sounds are normal. She exhibits no distension. There is no tenderness.  Musculoskeletal: Normal range of motion. She exhibits no edema and no tenderness.  Lymphadenopathy:    She has no cervical adenopathy.  Neurological: She is alert and oriented to person, place, and time. Coordination normal.  Skin: Skin is warm and dry. No rash noted. No erythema.  Psychiatric: She has a normal mood and affect. Her behavior is normal. Judgment and thought content normal.    Assessment and Plan

## 2014-01-26 NOTE — Assessment & Plan Note (Signed)
She continues to have significant insomnia, likely from poor controlled central sleep apnea

## 2014-01-26 NOTE — Assessment & Plan Note (Signed)
Chronic shortness of breath from underlying COPD. Encouraged to stop smoking.

## 2014-01-29 ENCOUNTER — Telehealth: Payer: Self-pay | Admitting: Pulmonary Disease

## 2014-01-29 NOTE — Telephone Encounter (Signed)
Called and spoke with pt and she stated that she needs appt with BQ in [place] and can only come on a Monday.  No appts available.  BQ please advise. thanks

## 2014-01-31 NOTE — Telephone Encounter (Signed)
If someone cancels we can see her. Did we move her appointment or something? Not sure why she is demanding place and date so strongly.

## 2014-02-01 ENCOUNTER — Ambulatory Visit (INDEPENDENT_AMBULATORY_CARE_PROVIDER_SITE_OTHER): Payer: Medicaid Other | Admitting: Pulmonary Disease

## 2014-02-01 ENCOUNTER — Encounter: Payer: Self-pay | Admitting: Pulmonary Disease

## 2014-02-01 VITALS — BP 128/66 | HR 85 | Ht 60.5 in | Wt 221.0 lb

## 2014-02-01 DIAGNOSIS — G4733 Obstructive sleep apnea (adult) (pediatric): Secondary | ICD-10-CM

## 2014-02-01 DIAGNOSIS — G4736 Sleep related hypoventilation in conditions classified elsewhere: Secondary | ICD-10-CM

## 2014-02-01 DIAGNOSIS — F17200 Nicotine dependence, unspecified, uncomplicated: Secondary | ICD-10-CM

## 2014-02-01 DIAGNOSIS — Z720 Tobacco use: Secondary | ICD-10-CM

## 2014-02-01 DIAGNOSIS — J432 Centrilobular emphysema: Secondary | ICD-10-CM

## 2014-02-01 DIAGNOSIS — R0602 Shortness of breath: Secondary | ICD-10-CM

## 2014-02-01 DIAGNOSIS — E669 Obesity, unspecified: Secondary | ICD-10-CM

## 2014-02-01 NOTE — Patient Instructions (Signed)
We will arrange a sleep study at [facility], take two Xanax that night Stop smoking Use Spiriva once daily, if this doesn't help call us and we will give you a sample of Anoro We will see you back in 3-4 weeks or sooner if needed

## 2014-02-01 NOTE — Assessment & Plan Note (Signed)
Continue 2.5 L each bedtime

## 2014-02-01 NOTE — Assessment & Plan Note (Signed)
Advised at length on the ill effects of cigarette smoking. Specifically, I advised that ongoing tobacco use will contribute to her known diagnosis of COPD and coronary artery disease. I advised that this will cause a rapid deterioration in her COPD should she not quit now. She was resistant to quitting and stated that she had no desire to give up cigarettes.

## 2014-02-01 NOTE — Telephone Encounter (Signed)
Called and spoke with pt and she is aware of opening with BQ today at 3:45.  appt has been scheduled.

## 2014-02-01 NOTE — Assessment & Plan Note (Addendum)
Her last polysomnogram was not consistent with obstructive sleep apnea. She did have some evidence of central sleep apnea. She does not use chronic narcotics but I believe that the alprazolam may contribute to this to some degree.  Today I again addressed the issue of sleep hygiene. She is amazingly resistant to any sort of suggestion to change. Specifically, she adamantly refuses to give up caffeine. Also, she rejects the idea that having a television on in her bedroom would interfere with her sleep.  Again, with this attitude it is very unlikely that we're going to be able to make any improvement.  Plan: - Split-night sleep study at [facility] -Encouraged to consider some of the basic tenants of sleep hygiene

## 2014-02-01 NOTE — Progress Notes (Signed)
Subjective:    Patient ID: Barbara Bowen, female    DOB: 08/28/1955, 58 y.o.   MRN: [id number]  Synopsis: Barbara Bowen first saw the [facility] pulmonary clinic in 2014 for evaluation of sleep apnea. She did not comply with the recommendation for a sleep study. She smokes up to one pack daily and has done so since her teenage years. She has known coronary disease. She has COPD. Lung function testing in October 2015 demonstrated moderate airflow obstruction.  HPI  Chief Complaint  Patient presents with  . Follow-up    Last seen 08/2012.  Pt having difficulty staying asleep at night.  Sleeps up to 4 hours at a time.  Takes a nap during the day.  Not wearing cpap, wears 2.5 L 02 qhs.    02/01/2014 ROV > this is the first time that I have seen Barbara Bowen in over a year and a half. She did not followup nor comply with recommendations from her last visit.  [name] says she wakes up tired in the morning.  She rarely if ever sleeps as much as 4 hours at a time.  She falls asleep without difficulty. She normally goes to bed around between 10pm and 2AM.  I is unpredictable.  She sleeps in a bedroom with the TV on.  She continues to drink coffee during the daytime.  She wakes up gasping for air.  She sometimes quits breathing when sleeping (per husband).  She doesn't have CPAP.  Her most recent polysomnogram was in 2013.    She has been more short of breath.  She had a heart cath and had a stent in July for an NSTEMI.  She gets short of breath just walking a few feet.  Sweeping, mopping bothers her. She is coughing up mucus.  She still smoking one pack per day.  She has been on other inhalers.  She takes albuterol throughout the week maybe twice and it helps.  She coughs a lot.  She had PFTs "a long time ago".  She had a lung biopsy before 2000 some time and was told that she had scarring.    Past Medical History  Diagnosis Date  . Coronary artery disease     a. s/p PCI of pRCA 09/2007.  EF 60%;  b.  Cath 2012 nonobs dzs.  Marland Kitchen Hypertension   . Hyperlipidemia   . Paroxysmal atrial fibrillation   . Hx-TIA (transient ischemic attack)   . Anxiety   . COPD (chronic obstructive pulmonary disease)   . OA (osteoarthritis)   . Tobacco use disorder     a. does not believe cigarettes contribute to her dyspnea/asthma.    . Enthesopathy of hip region   . Plantar fascial fibromatosis   . Proteinuria   . DM type 2 (diabetes mellitus, type 2)   . Unspecified tinnitus   . Insomnia, unspecified   . Unspecified sleep apnea     a. Wears O2 via [place] @ [facility].  . Lipoma of other skin and subcutaneous tissue   . Leukocytosis, unspecified   . Palpitations   . Occlusion and stenosis of carotid artery without mention of cerebral infarction      Review of Systems  Constitutional: Positive for fatigue. Negative for fever, chills, diaphoresis and appetite change.  HENT: Negative for congestion, hearing loss, nosebleeds, postnasal drip, rhinorrhea, sinus pressure, sore throat and trouble swallowing.   Eyes: Negative for discharge, redness and visual disturbance.  Respiratory: Positive for cough, shortness of breath and  wheezing. Negative for choking and chest tightness.   Cardiovascular: Negative for chest pain and leg swelling.  Gastrointestinal: Negative for nausea, abdominal pain, diarrhea, constipation and blood in stool.  Genitourinary: Negative for dysuria, frequency and hematuria.  Musculoskeletal: Negative for arthralgias, joint swelling, myalgias and neck stiffness.  Skin: Negative for color change, pallor and rash.  Neurological: Positive for facial asymmetry. Negative for dizziness, seizures, speech difficulty, light-headedness, numbness and headaches.  Hematological: Negative for adenopathy. Does not bruise/bleed easily.  Psychiatric/Behavioral: Positive for sleep disturbance.       Objective:   Physical Exam Filed Vitals:   02/01/14 1520  BP: 128/66  Pulse: 85  Height: 5' 0.5" (1.537 m)    Weight: 221 lb (100.245 kg)  SpO2: 94%   room air Ambulated 500 feet on room air and her O2 saturation remained at 94% or above  Gen: well appearing, no acute distress HEENT: NCAT, EOMi, OP clear, neck supple without masses PULM: CTA B CV: RRR, no mgr, no JVD AB: BS+, soft, nontender, no hsm Ext: warm, trace ankle edema, no clubbing, no cyanosis Derm: no rash or skin breakdown Neuro: A&Ox4, CN II-XII intact, MAEW  July 2015 chest x-ray from [facility] personally reviewed by me> there is emphysema bilaterally and scarring in the left lower lobe which is chronic in appearance, mild cardiomegaly  02/02/2012 simple spirometry> ratio 67%, FEV1 1.36 L (60% predicted)     Assessment & Plan:   COPD (chronic obstructive pulmonary disease) Blakely has GOLD grade C COPD based on her significant symptoms of shortness of breath and her moderate airflow obstruction. This is due solely to the fact that she continues to smoke cigarettes.  Fortunately, she does not have the phenotype of 1 with frequent exacerbations. Her dyspnea is exacerbated by her obesity, deconditioning, and heart disease. It does not sound like she has heart failure.  The most important thing she can do to help her self at this point would be to quit smoking cigarettes. However she is adamant that she has 0 desire to quit. She has a bad combination of significant symptoms with very little motivation to help her self.  Plan: -O2 therapy: Not indicated -Immunizations: Flu shot today -Tobacco use: Advised at length to quit, see below -Exercise: Encouraged regular exercise and will refer to pulmonary rehabilitation at [facility] -Bronchodilator therapy: Start trial of Spiriva one puff daily, if no improvement then we'll provide a sample of Anoro -Exacerbation prevention: Not indicated  Nocturnal hypoxemia due to obesity Continue 2.5 L each bedtime  Obstructive sleep apnea Her last polysomnogram was not  consistent with obstructive sleep apnea. She did have some evidence of central sleep apnea. She does not use chronic narcotics but I believe that the alprazolam may contribute to this to some degree.  Today I again addressed the issue of sleep hygiene. She is amazingly resistant to any sort of suggestion to change. Specifically, she adamantly refuses to give up caffeine. Also, she rejects the idea that having a television on in her bedroom would interfere with her sleep.  Again, with this attitude it is very unlikely that we're going to be able to make any improvement.  Plan: - Split-night sleep study at [facility] -Encouraged to consider some of the basic tenants of sleep hygiene  Smoking Advised at length on the ill effects of cigarette smoking. Specifically, I advised that ongoing tobacco use will contribute to her known diagnosis of COPD and coronary artery disease. I advised  that this will cause a rapid deterioration in her COPD should she not quit now. She was resistant to quitting and stated that she had no desire to give up cigarettes.    Updated Medication List Outpatient Encounter Prescriptions as of 02/01/2014  Medication Sig  . albuterol (PROAIR HFA) 108 (90 BASE) MCG/ACT inhaler Inhale 2 puffs into the lungs every 6 (six) hours as needed.    . ALPRAZolam (XANAX) 0.5 MG tablet Take 0.5 mg by mouth at bedtime as needed.    Marland Kitchen aspirin 81 MG EC tablet Take 243 mg by mouth daily.   . clopidogrel (PLAVIX) 75 MG tablet Take 1 tablet (75 mg total) by mouth daily.  . nebivolol (BYSTOLIC) 10 MG tablet Take 1 tablet (10 mg total) by mouth daily.  . nitroGLYCERIN (NITROSTAT) 0.4 MG SL tablet Place 1 tablet (0.4 mg total) under the tongue every 5 (five) minutes as needed.  . rosuvastatin (CRESTOR) 40 MG tablet Take 1 tablet (40 mg total) by mouth daily.

## 2014-02-01 NOTE — Assessment & Plan Note (Signed)
Barbara Bowen has GOLD grade C COPD based on her significant symptoms of shortness of breath and her moderate airflow obstruction. This is due solely to the fact that she continues to smoke cigarettes.  Fortunately, she does not have the phenotype of 1 with frequent exacerbations. Her dyspnea is exacerbated by her obesity, deconditioning, and heart disease. It does not sound like she has heart failure.  The most important thing she can do to help her self at this point would be to quit smoking cigarettes. However she is adamant that she has 0 desire to quit. She has a bad combination of significant symptoms with very little motivation to help her self.  Plan: -O2 therapy: Not indicated -Immunizations: Flu shot today -Tobacco use: Advised at length to quit, see below -Exercise: Encouraged regular exercise and will refer to pulmonary rehabilitation at [facility] -Bronchodilator therapy: Start trial of Spiriva one puff daily, if no improvement then we'll provide a sample of Anoro -Exacerbation prevention: Not indicated

## 2014-02-02 ENCOUNTER — Telehealth: Payer: Self-pay

## 2014-02-02 DIAGNOSIS — J432 Centrilobular emphysema: Secondary | ICD-10-CM

## 2014-02-02 NOTE — Telephone Encounter (Signed)
Order placed. Nothing further needed. 

## 2014-02-02 NOTE — Telephone Encounter (Signed)
Message copied by Len Blalock on Tue Feb 02, 2014  9:01 AM ------      Message from: Barbara Bowen      Created: Mon Feb 01, 2014  8:39 PM       Caryl Pina,            We need to refer this lady to pulmonary rehabilitation.            Thanks,      Ruby Cola ------

## 2014-02-05 ENCOUNTER — Institutional Professional Consult (permissible substitution): Payer: Medicaid Other | Admitting: Pulmonary Disease

## 2014-02-08 ENCOUNTER — Telehealth: Payer: Self-pay

## 2014-02-08 NOTE — Telephone Encounter (Signed)
Request from [place] , sent to [place] on 02/08/2014 .

## 2014-02-09 ENCOUNTER — Encounter: Payer: Self-pay | Admitting: Cardiovascular Disease

## 2014-02-11 ENCOUNTER — Ambulatory Visit: Payer: Self-pay | Admitting: Pulmonary Disease

## 2014-06-29 ENCOUNTER — Telehealth: Payer: Self-pay | Admitting: Cardiovascular Disease

## 2014-06-29 NOTE — Telephone Encounter (Signed)
Patient has not taken Medication for today.  Waiting on insurance auth. For pharmacy to fill Bystolic 10 mg.  Please call patient if he can get some samples while waiting on prior auth.    Please call Patient or husband.

## 2014-06-30 ENCOUNTER — Telehealth: Payer: Self-pay | Admitting: *Deleted

## 2014-06-30 NOTE — Telephone Encounter (Signed)
Patient has been approved for Bystolic 10 mg 1 tablet daily.   Approved 06/30/2014-06/30/2015

## 2014-06-30 NOTE — Telephone Encounter (Signed)
ERROR.  Correct approval date listed below.  Patient has been approved for Bystolic 10 mg 1 tablet daily.  Approved 06/30/14-06/25/15.

## 2014-07-03 IMAGING — MG MM CAD SCREENING MAMMO
1 series · 4 of 4 positions shown · non-contrast
Comparison: none

REASON FOR EXAM: SCR MAMMO
COMMENTS:

[R CC · right · 4 of 4 slices shown]
[im 1/4]
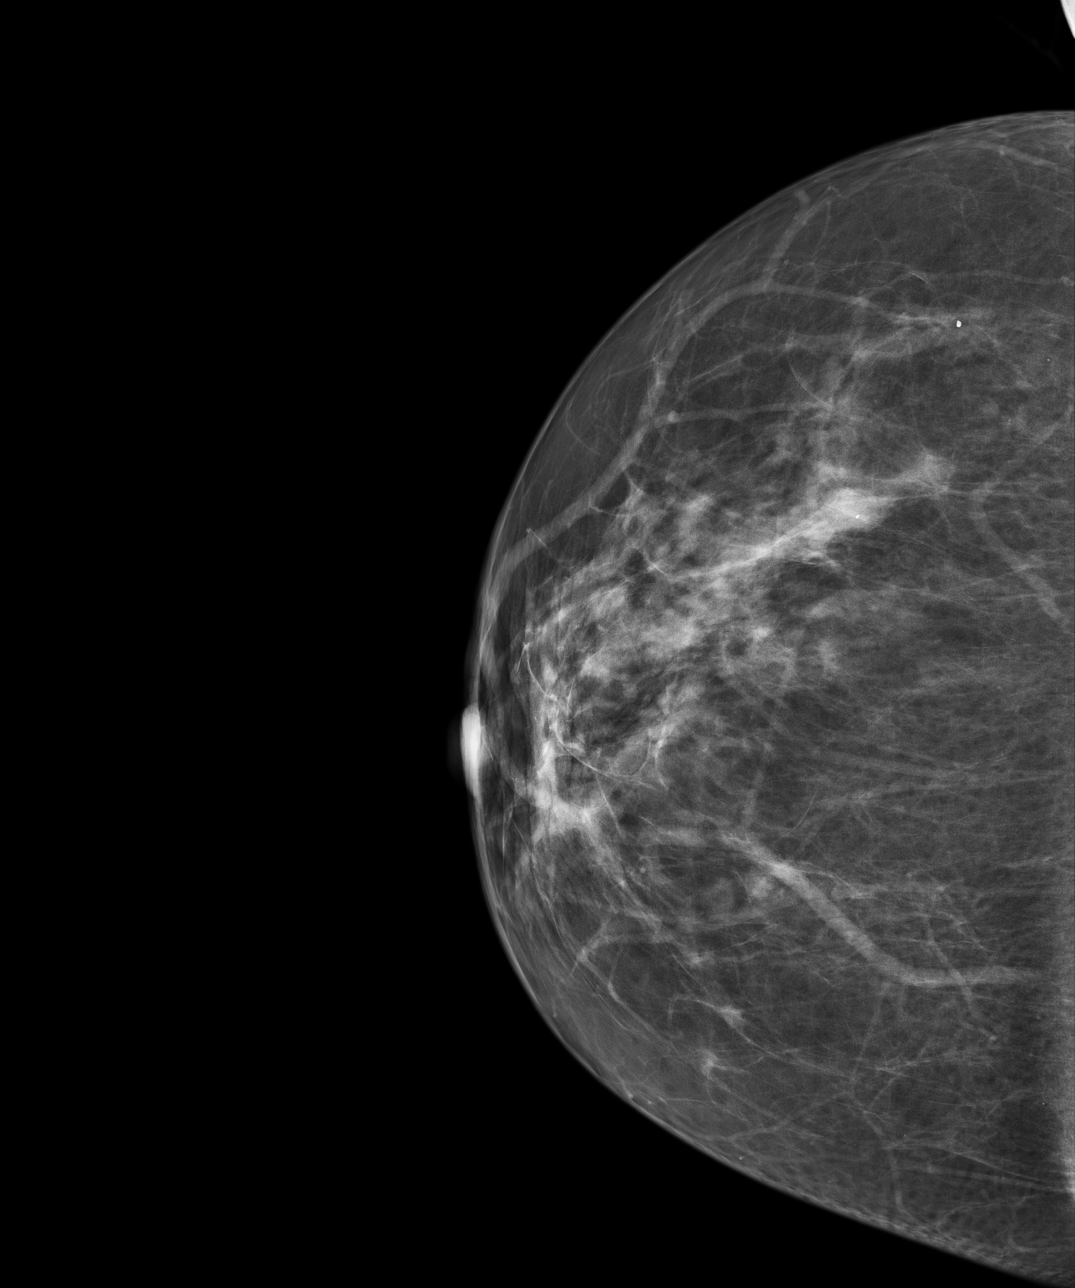
[im 2/4]
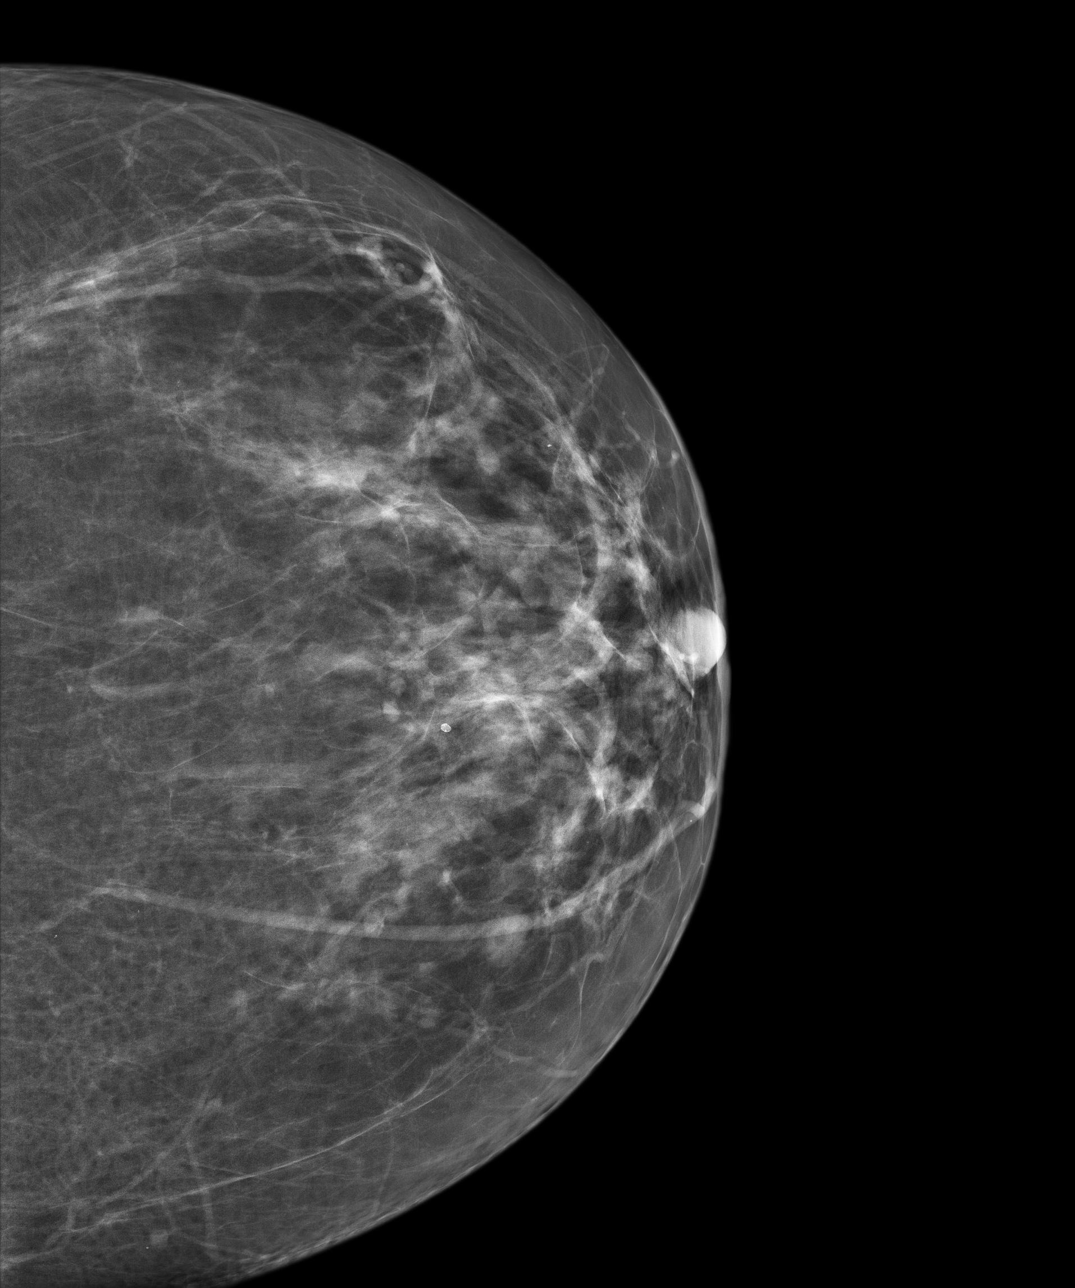
[im 3/4]
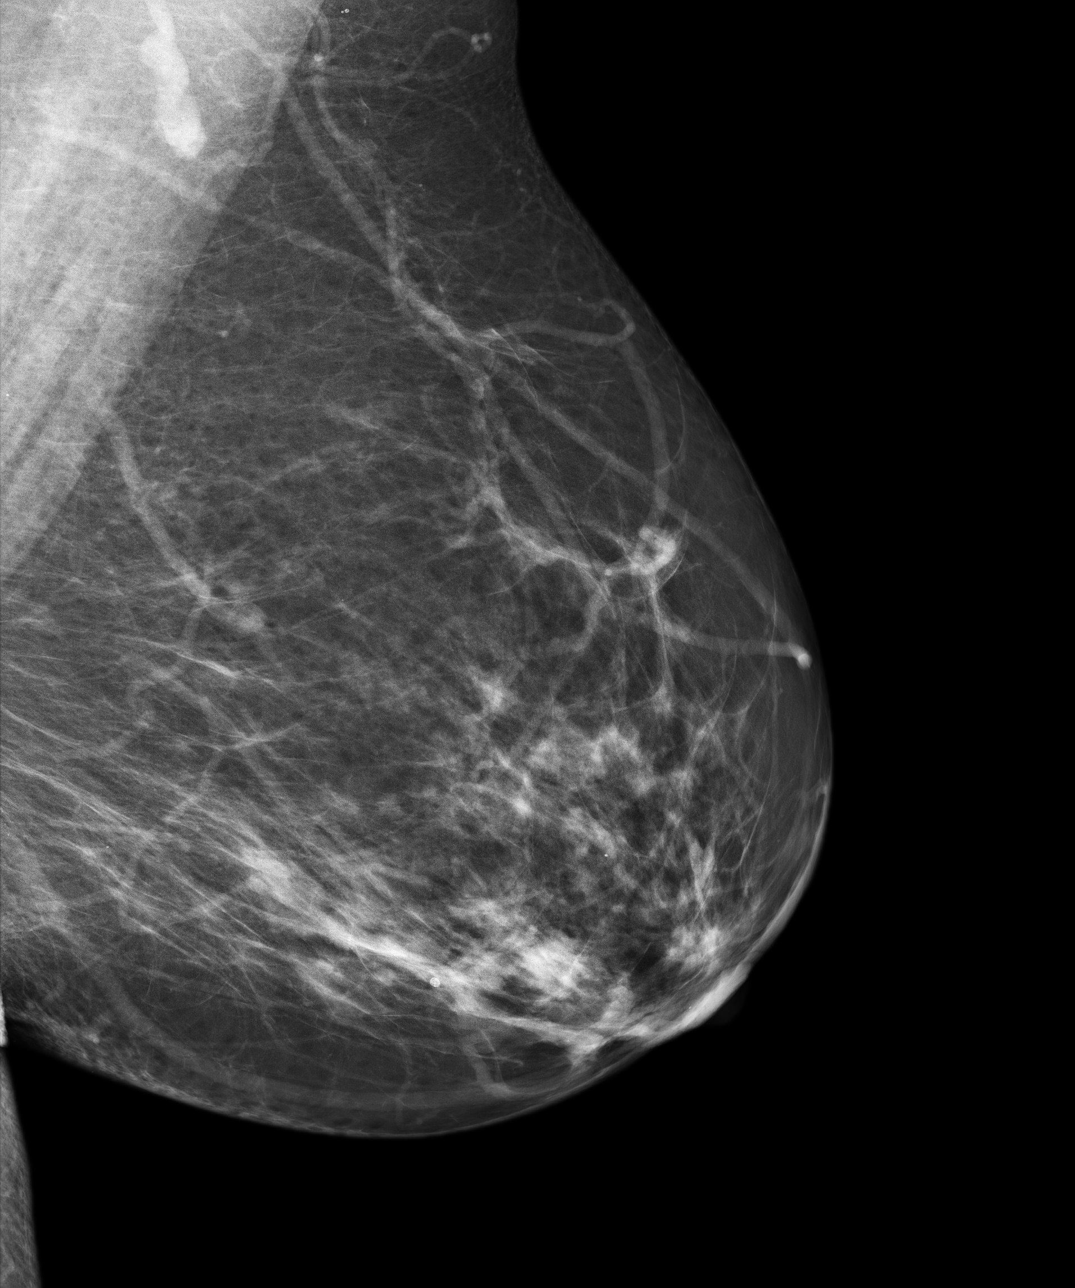
[im 4/4]
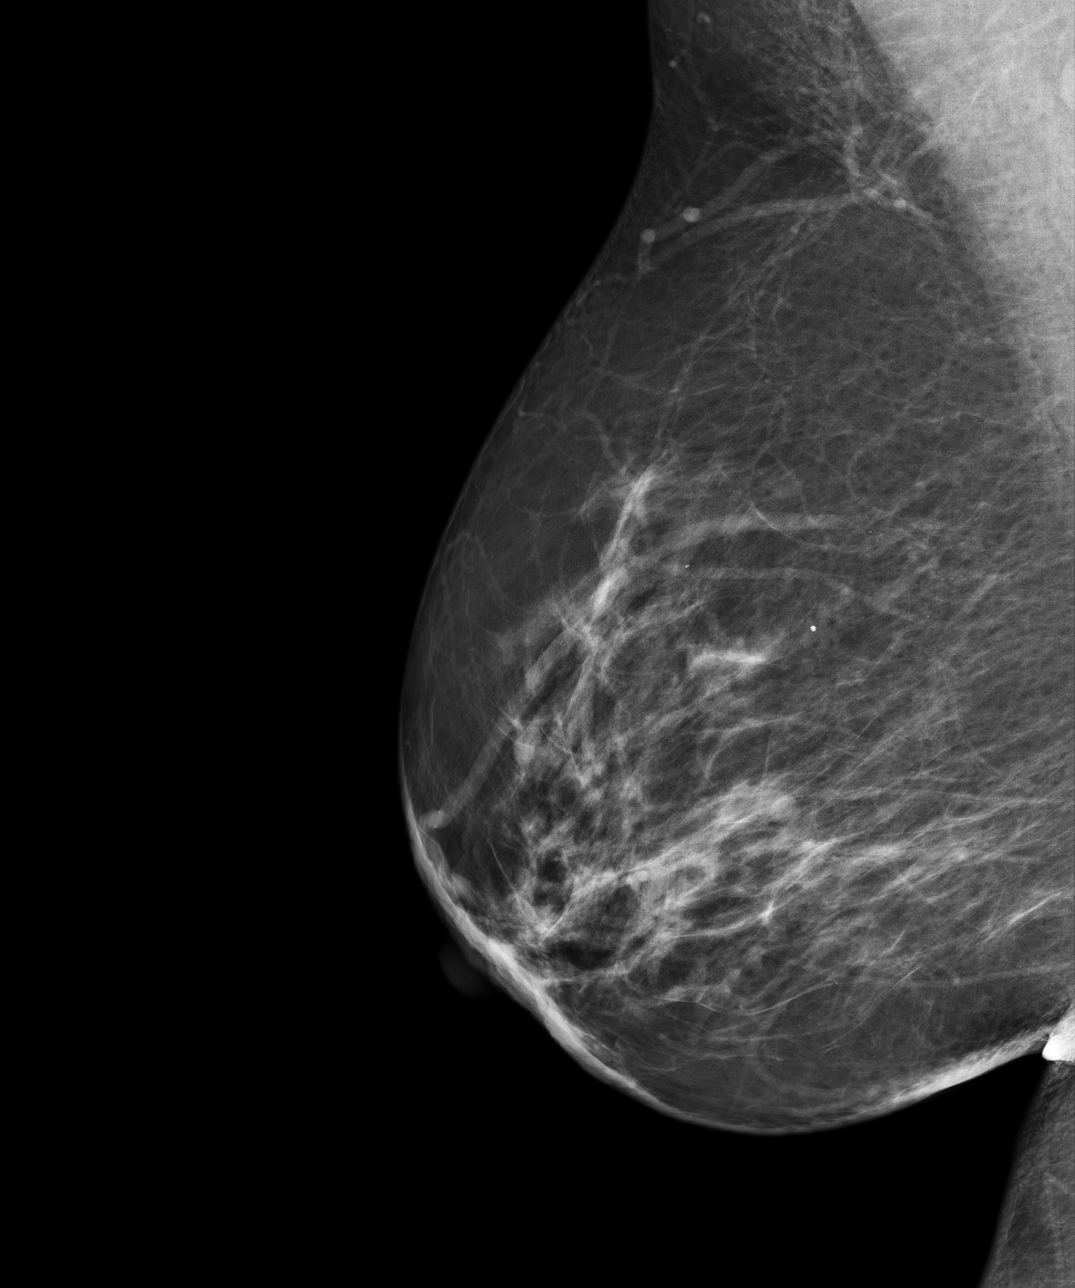

[4 of 4 positions shown; findings below may reference images not displayed]

PROCEDURE:     MAM - MAM DGTL SCREENING MAMMO W/CAD  - April 11, 2012  [DATE]

RESULT:     The patient has a family history of breast cancer in two
maternal aunts. The patient denies previous breast surgery or personal
history of breast cancer. Comparison is made to the three previous digital
mammographic exams dated 23 May, 2009, as well as 16 July, 2007 and 22 May, 2006. There is a mild to moderately dense parenchymal pattern. On
the right breast CC projection along the posterior aspect of a band of
parenchymal density laterally there is some nodularity which appears to
project inferior to the nipple on the MLO projection. This area is marked
and saved electronically. Please have the patient return for additional
magnification compression images. Further evaluation with ultrasound may be
necessary. This area is not definitely identified on the previous exams.

Otherwise, there is a stable parenchymal pattern without malignant
calcification or architectural distortion.
IMPRESSION: 1. Please have the patient return for additional magnification compression
images of the area of concern in the lateral mid to posterior right breast
near the level of the nipple on the MLO projection. A medial to lateral
projection should also be performed. Further assessment with ultrasound may
be necessary if the area persists.

BI-RADS: Category 0 - Needs Addtional Imaging Evaluation

A NEGATIVE MAMMOGRAM REPORT DOES NOT PRECLUDE BIOPSY OR OTHER EVALUATION OF
A CLINICALLY PALPABLE OR OTHERWISE SUSPICIOUS MASS OR LESION. BREAST CANCER
MAY NOT BE DETECTED BY MAMMOGRAPHY IN UP TO 10% OF CASES.

Thank you for the opportunity to contribute to the care of your patient.

Breast density: BI-RADS type 3

[REDACTED]

## 2014-07-09 ENCOUNTER — Ambulatory Visit: Payer: Self-pay | Admitting: Family Medicine

## 2014-07-16 ENCOUNTER — Other Ambulatory Visit: Payer: Self-pay | Admitting: Cardiovascular Disease

## 2014-07-16 ENCOUNTER — Encounter: Payer: Self-pay | Admitting: Cardiovascular Disease

## 2014-07-16 ENCOUNTER — Ambulatory Visit (INDEPENDENT_AMBULATORY_CARE_PROVIDER_SITE_OTHER): Payer: Medicaid Other | Admitting: Cardiovascular Disease

## 2014-07-16 VITALS — BP 118/64 | HR 68 | Ht 60.5 in | Wt 224.0 lb

## 2014-07-16 DIAGNOSIS — E785 Hyperlipidemia, unspecified: Secondary | ICD-10-CM | POA: Diagnosis not present

## 2014-07-16 DIAGNOSIS — R0602 Shortness of breath: Secondary | ICD-10-CM | POA: Diagnosis not present

## 2014-07-16 DIAGNOSIS — Z720 Tobacco use: Secondary | ICD-10-CM | POA: Diagnosis not present

## 2014-07-16 DIAGNOSIS — J432 Centrilobular emphysema: Secondary | ICD-10-CM

## 2014-07-16 DIAGNOSIS — F17200 Nicotine dependence, unspecified, uncomplicated: Secondary | ICD-10-CM

## 2014-07-16 DIAGNOSIS — I2510 Atherosclerotic heart disease of native coronary artery without angina pectoris: Secondary | ICD-10-CM | POA: Diagnosis not present

## 2014-07-16 LAB — LIPID PANEL
Cholesterol: 172 mg/dL
HDL Cholesterol: 32 mg/dL — ABNORMAL LOW
LDL CHOLESTEROL, CALC: 110 mg/dL — AB
TRIGLYCERIDES: 148 mg/dL
VLDL Cholesterol, Calc: 30 mg/dL

## 2014-07-16 LAB — HEPATIC FUNCTION PANEL A (ARMC)
AST: 22 U/L
Albumin: 3.5 g/dL
Alkaline Phosphatase: 97 U/L
Bilirubin,Total: 0.4 mg/dL
SGPT (ALT): 21 U/L
Total Protein: 7.4 g/dL

## 2014-07-16 MED ORDER — NEBIVOLOL HCL 10 MG PO TABS
10.0000 mg | ORAL_TABLET | Freq: Every day | ORAL | Status: DC
Start: 2014-07-16 — End: 2015-01-04

## 2014-07-16 NOTE — Assessment & Plan Note (Signed)
Currently with no symptoms of angina. No further workup at this time. Continue current medication regimen. High risk of recurrent stenosis given her continued heavy smoking. This was discussed with her

## 2014-07-16 NOTE — Assessment & Plan Note (Signed)
We have encouraged her to continue to work on weaning her cigarettes and smoking cessation. She will continue to work on this and does not want any assistance with chantix.  

## 2014-07-16 NOTE — Assessment & Plan Note (Signed)
Cholesterol above goal. Encouraged weight loss. Lab check today ordered

## 2014-07-16 NOTE — Patient Instructions (Signed)
You are doing well. No medication changes were made.  We will check labs today  Please call us if you have new issues that need to be addressed before your next appt.  Your physician wants you to follow-up in: 6 months.  You will receive a reminder letter in the mail two months in advance. If you don't receive a letter, please call our office to schedule the follow-up appointment.

## 2014-07-16 NOTE — Assessment & Plan Note (Signed)
Recommended smoking cessation. Continued shortness of breath with exertion

## 2014-07-16 NOTE — Assessment & Plan Note (Signed)
Chronic baseline shortness of breath with exertion. Recommended smoking cessation. Underlying COPD

## 2014-07-16 NOTE — Progress Notes (Signed)
Patient ID: Barbara Bowen, female    DOB: 1955/06/18, 59 y.o.   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 She has chronic insomnia, snores heavily She presents today for follow-up of her coronary artery disease Last catheterization in 2015  In follow-up, she denies any significant chest pain. She continues to smoke over one pack per day, typically 1.5 packs. She has no desire to quit. Husband has already quit over one year ago and bought her patches but she does not want to use them. She is otherwise active, denies any significant leg edema, near syncope or syncope. She was supposed to do cholesterol at the end of 2015 and she did not do this. She is tolerating Crestor. No significant upper respiratory infections through the winter, possibly one short episode  EKG shows normal sinus rhythm with T-wave abnormality, no significant change compared to prior EKG. Hemoglobin A1c 7.2  Cardiac catheterization in 2015  She underwent cardiac catheterization that showed 95% distal RCA disease. With DES placed. 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 Stent size was 3.0 x 23 mm stent   Other past medical history She reports that she has periods of palpitations, they seem to cluster. Occasionally has dizziness when standing. She has chronic tinnitus. Your pressure when standing recently  Previous 48-hour Holter monitor  that showed frequent PVCs. She continues to take low-dose beta blocker in the evening. bystolic 10 mg daily She is taking her Crestor inconsistently. She continues to use oxygen at nighttime  History of obstructive sleep apnea and only uses nasal cannula oxygen. She reports having sleep study x2 in the past. The second sleep study she did not sleep for very long. She currently sleeps 2 to 3 hours per night, wakes up at 2 AM.  no significant improvement In her sleep hygiene with nasal cannula oxygen She still wakes herself up snoring. She has seen neurology in the past with no insight into her sleep hygiene.  Cardiac catheterization from February 2012: Left main: Long. Anomalous origin off right cooronary cusp. Appears to run anterior to the aorta  30-40%  LAD:  mild luminal plaqure.. small mid intramyocardial bridge without flow limitation.  LCX:  small. made up primarily of an OM-1. 30-40% ostiall lesion   RCA: Large dominant vessel.Proximal stent widely patent. 40-50% mid after stent. mild plaquing distally.  LV: EF55% no regional wall motion abnormalities Stable CAD with anomalous left main and normal LV function.    Allergies  Allergen Reactions  . Amoxicillin-Pot Clavulanate     Vomiting   . Cefprozil     Vomiting   . Eggs Or Egg-Derived Products     Hives   . Esomeprazole Magnesium     unknown  . Montelukast Sodium     unknown  . Omeprazole     unknown  . Penicillins     Vomiting blood  . Prednisone     Rapid  heart beat & difficulty breathing  . Spiriva [Tiotropium Bromide Monohydrate]     HA    Outpatient Encounter Prescriptions as of 07/16/2014  Medication Sig  . albuterol (PROAIR HFA) 108 (90 BASE) MCG/ACT inhaler Inhale 2 puffs into the lungs every 6 (six) hours as needed.    . ALPRAZolam (XANAX) 0.5 MG tablet Take 0.5 mg by mouth at bedtime as needed.    Marland Kitchen aspirin 81 MG EC tablet Take 243 mg by mouth daily.   . clopidogrel (PLAVIX) 75 MG tablet Take 1 tablet  (75 mg total) by mouth daily.  Marland Kitchen HYDROcodone-acetaminophen (NORCO) 10-325 MG per tablet Take 1 tablet daily as needed for headaches  . nebivolol (BYSTOLIC) 10 MG tablet Take 1 tablet (10 mg total) by mouth daily.  . nitroGLYCERIN (NITROSTAT) 0.4 MG SL tablet Place 1 tablet (0.4 mg total) under the tongue every 5 (five) minutes as needed.  . rosuvastatin (CRESTOR) 40 MG tablet Take 1 tablet (40 mg total) by mouth daily.  . [DISCONTINUED] nebivolol (BYSTOLIC) 10 MG tablet Take 1 tablet (10 mg total) by mouth daily.    Past Medical History  Diagnosis Date  . Coronary artery disease     a. s/p PCI of pRCA 09/2007.  EF 60%;  b. Cath 2012 nonobs dzs.  Marland Kitchen Hypertension   . Hyperlipidemia   . Paroxysmal atrial fibrillation   . Hx-TIA (transient ischemic attack)   . Anxiety   . COPD (chronic obstructive pulmonary disease)   . OA (osteoarthritis)   . Tobacco use disorder     a. does not believe cigarettes contribute to her dyspnea/asthma.    . Enthesopathy of hip region   . Plantar fascial fibromatosis   . Proteinuria   . DM type 2 (diabetes mellitus, type 2)   . Unspecified tinnitus   . Insomnia, unspecified   . Unspecified sleep apnea     a. Wears O2 via  @ HS.  . Lipoma of other skin and subcutaneous tissue   . Leukocytosis, unspecified   . Palpitations   . Occlusion and stenosis of carotid artery without mention of cerebral infarction     Past Surgical History  Procedure Laterality Date  . Abdominal hysterectomy    . Appendectomy    . Exploratory thoracotomy    . Mole removal    . Breast surgery Right 06-09-2012    biopsy x2 9 oclock and 11 oclock  . Cardiac catheterization  2009    s/p right coronary artery drug-eluting stent  . Coronary angioplasty  2015    Social History  reports that she has been smoking Cigarettes.  She started smoking about 36 years ago. She has a 36 pack-year smoking history. She has never used smokeless tobacco. She reports that she does not drink  alcohol or use illicit drugs.  Family History family history includes Breast cancer in her maternal aunt; Coronary artery disease in her mother; Diabetes type II in her mother; Heart disease in her father.  Review of Systems  Constitutional: Negative.   HENT: Negative.   Respiratory: Positive for shortness of breath.   Cardiovascular: Negative.   Gastrointestinal: Negative.   Musculoskeletal: Negative.   Skin: Negative.   Neurological: Negative.   Hematological: Negative.   Psychiatric/Behavioral: Positive for sleep disturbance.  All other systems reviewed and are negative.   BP 118/64 mmHg  Pulse 68  Ht 5' 0.5" (1.537 m)  Wt 224 lb (101.606 kg)  BMI 43.01 kg/m2  Physical Exam  Constitutional: She  is oriented to person, place, and time. She appears well-developed and well-nourished.  HENT:  Head: Normocephalic.  Nose: Nose normal.  Mouth/Throat: Oropharynx is clear and moist.  Eyes: Conjunctivae are normal. Pupils are equal, round, and reactive to light.  Neck: Normal range of motion. Neck supple. No JVD present.  Cardiovascular: Normal rate, regular rhythm, S1 normal, S2 normal, normal heart sounds and intact distal pulses.  Exam reveals no gallop and no friction rub.   No murmur heard. Pulmonary/Chest: Effort normal and breath sounds normal. No respiratory distress. She has no wheezes. She has no rales. She exhibits no tenderness.  Abdominal: Soft. Bowel sounds are normal. She exhibits no distension. There is no tenderness.  Musculoskeletal: Normal range of motion. She exhibits no edema or tenderness.  Lymphadenopathy:    She has no cervical adenopathy.  Neurological: She is alert and oriented to person, place, and time. Coordination normal.  Skin: Skin is warm and dry. No rash noted. No erythema.  Psychiatric: She has a normal mood and affect. Her behavior is normal. Judgment and thought content normal.    Assessment and Plan  Nursing note and vitals reviewed.

## 2014-07-19 ENCOUNTER — Other Ambulatory Visit: Payer: Self-pay

## 2014-07-19 DIAGNOSIS — R0602 Shortness of breath: Secondary | ICD-10-CM

## 2014-07-19 DIAGNOSIS — R002 Palpitations: Secondary | ICD-10-CM

## 2014-08-14 NOTE — H&P (Signed)
PATIENT NAME:  Barbara Bowen, Barbara Bowen MR#:  [id number] DATE OF BIRTH:  February 02, 1956  DATE OF ADMISSION:  10/28/2013  REFERRING PHYSICIAN: Latina Craver, MD  PRIMARY CARE PHYSICIAN: Bethena Roys. Sowles, MD  PRIMARY CARDIOLOGIST: Minna Merritts, MD  CHIEF COMPLAINT: Chest pain.   HISTORY OF PRESENT ILLNESS: The patient is a morbidly obese Caucasian female with history of MI, irregular heartbeat, history of TIA, who is an active smoker. She, of note, has had a visit to the ER on the 6th for chest pain, where she was discharged from the ER. Her outpatient cardiologist is aware of her symptoms, which started about a week ago, which are substernal, radiating to the left arm. Previously, she was taking nitroglycerin with relief. She saw her cardiologist, who scheduled a cardiac catheterization on Friday, but she came in today after the pain did not improve with nitroglycerin. Here in the ER, she has had 2 nitroglycerin, and the pain is relieved. She has a negative troponin, and hospitalist services were contacted for further evaluation and management.   PAST MEDICAL HISTORY:  1. History of CAD status post MI.  2. Irregular heartbeats.  3. Hyperlipidemia. 4. Asthma. 5. TIA. 6. History of chronic leukocytosis.   PAST SURGICAL HISTORY:  1. Hysterectomy.  2. Appendectomy.   SOCIAL HISTORY: A 1- to 1-1/2-pack smoker. No alcohol or drug use.   FAMILY HISTORY: Dad had a stroke. A sister with low blood pressure.   ALLERGIES: MULTIPLE, INCLUDING AUGMENTIN, CEFZIL, NEXIUM, PENICILLIN, PREDNISONE, PRILOSEC, PROPULSID, SINGULAIR, TEQUIN, ZITHROMAX, CHERRIES, CHEESE, EGGS.   OUTPATIENT MEDICATIONS:  1. Aspirin 81 mg daily.  2. Plavix 75 mg daily. 3. Bystolic 5 mg daily. 4. Crestor 40 mg daily. 5. Oxygen 2.5 liters. 6. ProAir p.r.n. 7. Xanax 0.5 mg 1 to 1-1/2 tabs 3 times a day.   REVIEW OF SYSTEMS:  CONSTITUTIONAL: Weight steady. No fevers or chills.  EYES: No blurry vision or double vision.  ENT:  Positive for tinnitus.  RESPIRATORY: No cough, wheezing, shortness of breath. Has asthma.  CARDIOVASCULAR: Chest pain as above. No orthopnea. No CHF. Has history of CAD and MI. Has history of irregular heartbeats.  GASTROINTESTINAL: Denies nausea now, but has had nausea with her symptoms. No abdominal pain, bloody stools, dark stools.  GENITOURINARY: Denies dysuria or hematuria.  HEMATOLOGIC AND LYMPHATIC: Denies anemia or easy bruising.  SKIN: No rashes.  MUSCULOSKELETAL: Has some chronic arthritis. NEUROLOGIC: No focal weakness or numbness. Has history of TIAs, per patient.  PSYCHIATRIC: Has anxiety.   PHYSICAL EXAMINATION:  VITAL SIGNS: On arrival, temperature was 98, pulse rate 77, respiratory rate 18, blood pressure 151/92, O2 saturation 96% on room air.  GENERAL: The patient is a morbidly obese female lying in bed.  HEENT: Normocephalic, atraumatic. Pupils are equal. Moist mucous membranes. Poor dentition.  NECK: Supple. No thyroid tenderness. No cervical lymphadenopathy.  CARDIOVASCULAR: S1, S2 regular. No significant murmurs appreciated.  LUNGS: Clear to auscultation, without wheezing, rhonchi or rales.  ABDOMEN: Soft, nontender, nondistended. Positive bowel sounds in all quadrants.  EXTREMITIES: No pitting edema.  SKIN: No obvious rashes or lesions.  NEUROLOGIC: Cranial nerves II through XII appear to be grossly intact. Strength is 5 out of 5 in all extremities. Sensation is intact to light touch.  PSYCHIATRIC: Awake, alert and oriented x3.   LABORATORIES AND IMAGING: White count 14.1, hemoglobin 15.2, platelets 216. Troponin negative x1. LFTs: Albumin is 3.3, otherwise within normal limits. UA does not suggest infection. Glucose 148, BUN 10, creatinine 0.92, sodium  130, potassium 3.9. EKG: Sinus, rate is 87, left axis deviation, also LVH. There are also ST depressions in V3 through V6 to me, which were not in the EKG from the 6th. X-ray of the chest, 1-view: Minimum left basilar  atelectasis.   ASSESSMENT AND PLAN: We have a 59 year old smoker with coronary artery disease, myocardial infarction, with irregular heart rhythm, who has been having symptoms typical for angina, and today, did not improve with nitroglycerin; therefore, she came into the hospital. I suspect the patient is having acute coronary syndrome with typical symptoms and story as well as history of coronary artery disease and myocardial infarction in conjunction with ST depressions on the EKG. The pain was improved with nitroglycerin. At this point, she will be coming into the hospital with aspirin and Plavix. I would start her on heparin drip and a nitroglycerin patch. The patient is very concerned about her pressure and wants the Bystolic to be held, but I would put her on low-dose metoprolol given the myocardial infarction, and pressure after the nitroglycerin is not significantly depressed. Last blood pressure we have is 117/69 after the nitroglycerin. Will also continue her Crestor. Cycle the troponins. Admit her to telemetry. The patient states that she has had a recent echocardiogram in the last week or two, which I would not repeat here. It is likely the patient will undergo cardiac catheterization, as I discussed the case with cardiology myself, who are hopefully planning on taking her to the catheterization lab tomorrow. Will check a TSH, lipid profile as well as a hemoglobin A1c. She was counseled for about 3 minutes about her smoking, but she does not want to hear it. She told me, "move on, and I want to quit when I want to quit." Would continue her on her inhaler and Xanax. Would put her on morphine p.r.n. Would follow with cardiology recommendations and after discussing the case with cardiology already. The patient will be started on 2 gram sodium diet.   TOTAL TIME SPENT: About 45 minutes.   ____________________________ Vivien Presto, MD sa:lb D: 10/28/2013 10:21:14 ET T: 10/28/2013 10:35:21  ET JOB#: [id number]  cc: Vivien Presto, MD, <Dictator> Bethena Roys. Ancil Boozer, MD Minna Merritts, MD Vivien Presto MD ELECTRONICALLY SIGNED 10/30/2013 17:17

## 2014-08-14 NOTE — Discharge Summary (Signed)
PATIENT NAME:  Barbara Bowen, Barbara Bowen MR#:  [id number] DATE OF BIRTH:  December 23, 1955  DATE OF ADMISSION:  10/28/2013 DATE OF DISCHARGE:  10/30/2013  CONSULTANTS: Dr. Acie Fredrickson, as well as Dr. Fletcher Anon from cardiology.   CHIEF COMPLAINT: Chest pain.   DISCHARGE DIAGNOSES:  1.  Non-ST elevation myocardial infarction status post drug-eluting stents to distal right coronary artery lesion, as well as balloon angioplasty to right posterior descending artery lesion.  2.  Ongoing tobacco abuse,  3.  Diagnosis of diabetes, which patient refuses to acknowledge.  4.  Hyperlipidemia.  5.  History of coronary artery disease, status post myocardial infarction in the past.  6.  History of asthma.  7.  History of transient ischemic attack.  8.  History of chronic leukocytosis.  9.  History of irregular heartbeats.   DISCHARGE MEDICATIONS: Plavix 75 mg daily, Xanax 0.5 mg 1 to 1-1/2 tabs 3 times a day as needed, Bystolic 5 mg daily, aspirin 81 mg daily, ProAir p.r.n., oxygen with exertion at 2 to 2.5 liters via nasal cannula for shortness of breath and at night, acetaminophen/hydrocodone  325/10 mg 1 tab as needed, Crestor 40 mg at bedtime.  She will be going home with 2 liters of oxygen via nasal cannula as above.   DIET: Low-sodium, low-fat, ADA diet.   ACTIVITY: As tolerated.   FOLLOWUP: Please follow with PCP and cardiology within 1-2 weeks. For any recurrent chest pains, pressure, tightness or shortness of breath, or any other symptoms, call your doctor and/or 911 right away.   DISPOSITION: Home.   CODE STATUS: THE PATIENT IS A FULL CODE.   SIGNIFICANT LABS AND IMAGING: Cardiac catheterization showing EF of about 60%, with 95% lesion to the distal RCA, which had a drug-eluting stent placed, as well as a second lesion with 80% right PDA stenosis that had balloon angioplasty with 30% residual stenosis post intervention. Initial troponin negative.  Peak troponin 0.14, CK-MB were negative x 3. Hemoglobin A1c of 6.9.  LDL 103. Urinalysis negative.   HISTORY OF PRESENT ILLNESS AND HOSPITAL COURSE: For full details of the H and P, please refer to the dictation on July 08 by Dr. Bridgette Habermann; but briefly, this is a 59 year old obese female with history of CAD, status post MI who has been having chest pain for about a week or so, who had came to the ER on the 6th for chest pain, got discharged from the ER, who had followed with her cardiologist and a cardiac catheter was planned on 10th of July; however, the patient can came to the hospital on the 8th for ongoing symptoms and was admitted to the hospitalist service. She did have some ST depression on EKG. She was admitted to the telemetry; started on a heparin drip, nitroglycerin patch, aspirin, and Plavix; and was seen by cardiology. She underwent a cardiac catheterization showing the above findings and she did have a drug-eluting stent placed here. She was counseled about her smoking habits, but she does not want to smoke and at this time states that I want to smoke and I want to quit when I want to. She is on aspirin, Plavix, and statin as an outpatient, and she will be discharged on them. Of note, she also was noted to have a hemoglobin A1c of 6.9, and when I wanted to discuss with her about diagnosed with diabetes, she refused to hear me. She stated that my doctor, Dr. Ancil Boozer has talked to me about this for 3 years. My sugars go up  and down, but I absolutely do not have diabetes and even after I attempted to talk to her about the basis for the hemoglobin A1c measurements, etc., she did not want to hear it. I offered her medications for diagnosis for diabetes and she refused to take them. At this point, I recommended for her to follow with her primary care physician, Dr. Ancil Boozer. She stated that Dr. Ancil Boozer had told me of about my sugars were 3 years. At this point, she has no significant chest pain or shortness of breath. She will be discharged to outpatient follow up with Dr.  Ancil Boozer, as well as cardiology.   PHYSICAL EXAMINATION:   VITAL SIGNS:  On the day of discharge, temperature is 98.4, pulse rate 69, respiratory rate 19, blood pressure 141/85, oxygen saturation 96%.  GENERAL: The patient is an obese female sitting on a bed in no obvious distress. HEENT:  Normocephalic, atraumatic.  HEART: Normal S1, S2.  LUNGS: Clear.  ABDOMEN: Benign.  EXTREMITIES: No extremity edema.  NEUROLOGIC:  She is awake, alert, oriented.   TOTAL TIME SPENT: About for 40 minutes.   CODE STATUS: THE PATIENT IS FULL CODE.   ____________________________ Vivien Presto, MD sa:ts D: 10/30/2013 10:48:50 ET T: 10/30/2013 11:05:53 ET JOB#: [id number]  cc: Vivien Presto, MD, <Dictator> Bethena Roys. Ancil Boozer, MD Minna Merritts, MD Vivien Presto MD ELECTRONICALLY SIGNED 10/30/2013 17:17

## 2014-08-14 NOTE — Consult Note (Signed)
General Aspect 59 y/o F with h/o CAD, MI s/p PCI RCA 09/2007, EF 60%, cath 2012 non obs dzs, paroxysmal a fib, TIA, asthma, HTN, hyperlipidemia, COPD, DM, OSA, and tobacco use disorder presents with worsening substernal chest pain x 1 month.  __________   Present Illness 59 y/o F with the above problem list presents with worsening substernal chest pain x 828month that has been getting worse over the past 1 week. She of note, presented to the [facility] ED on 10/26/13 with this chest pain, was ruled out, had a relatively unchanged EKG, and was discharged from the ED. She followed up with Dr. Mariah Milling on 10/26/13 with complaints of both exertional and non exertional chest pain. At that visit she was started on Imdur 10 mg tid and scheduled for cardiac cath on 10/30/13. When she got the rx she took it as directed, but had continual chest pain. States "it did nothing but give me a headache." She continued to take this as directed over night due to chest pain. She added in SL Nitro x 2 without much help either, prompting her to contact EMS and come to the ED.   Chest pain pain is associated with nausea and diaphoresis. Pain is described as a "burning." This is different than her previous pain which she describes as a flicking sensation. She denies any reflux. She has in the past had episodes of palpitations and has worn a 48-hour Holter monitor showing frequent PVCs. She is currently on Bystolic 10 mg daily.   Physical Exam:  GEN well developed, well nourished, no acute distress, obese   HEENT PERRL, hearing intact to voice   NECK supple   RESP normal resp effort  clear BS   CARD Regular rate and rhythm  Normal, S1, S2  No murmur   ABD denies tenderness  soft  normal BS   LYMPH negative neck   EXTR negative edema   SKIN normal to palpation   NEURO cranial nerves intact   PSYCH alert   Review of Systems:  General: No Complaints   Skin: No Complaints   ENT: No Complaints   Eyes: No Complaints    Neck: No Complaints   Respiratory: No Complaints   Cardiovascular: Chest pain or discomfort   Gastrointestinal: No Complaints   Genitourinary: No Complaints   Vascular: No Complaints   Musculoskeletal: No Complaints   Neurologic: No Complaints   Hematologic: No Complaints   Endocrine: No Complaints   Psychiatric: No Complaints   Review of Systems: All other systems were reviewed and found to be negative   Medications/Allergies Reviewed Medications/Allergies reviewed   Family & Social History:  Family and Social History:  Family History Stroke   Social History positive  tobacco, negative ETOH, negative Illicit drugs   + Tobacco Current (within 1 year)  Somes 1-1.5 ppd.   Place of Living Home  Lives in [place]. Retired from disability.     Endometriosis:    MI - Myocardial Infarct:    Hiatal Hernia:    Asthma:    IRREGULAR HEART BEAT:    TIA - Transient Ischemic Attack:    Lung biopsy:    Cardiac stenting: RCA -- 2009   Bronchoscopy:    Hysterectomy - Total:    Appendectomy:   Home Medications: Medication Instructions Status  Xanax 0.5 mg oral tablet 1 to 1.5 three times daily prn orally   Active  Plavix 75 mg oral tablet 1  orally once a day  Active  Bystolic 5 mg oral tablet 1  orally once a day  Active  aspirin 81 mg oral tablet 1  orally once a day  Active  ProAir HFA 90 mcg/inh inhalation aerosol with adapter 1  inhaled  as needed   Active  oxygen 2.5   L/m via [place]  -for Shortness of Breath Active  acetaminophen-HYDROcodone 325 mg-10 mg oral tablet 1 tab(s) orally every 6 hours, As Needed - for Headache Active  Crestor 40 mg oral tablet 1 tab(s) orally once a day (at bedtime) Active   Lab Results:  Hepatic:  08-Jul-15 08:17   Bilirubin, Total 0.3  Alkaline Phosphatase 102 (45-117 NOTE: New Reference Range 03/13/13)  SGPT (ALT) 24  SGOT (AST) 19  Total Protein, Serum 7.6  Albumin, Serum  3.3  Routine Chem:  08-Jul-15 08:17    Glucose, Serum  148  BUN 10  Creatinine (comp) 0.92  Sodium, Serum 138  Potassium, Serum 3.9  Chloride, Serum 102  CO2, Serum 27  Calcium (Total), Serum 8.7  Osmolality (calc) 277  eGFR (African American) >60  eGFR (Non-African American) >60 (eGFR values <14mL/min/1.73 m2 may be an indication of chronic kidney disease (CKD). Calculated eGFR is useful in patients with stable renal function. The eGFR calculation will not be reliable in acutely ill patients when serum creatinine is changing rapidly. It is not useful in  patients on dialysis. The eGFR calculation may not be applicable to patients at the low and high extremes of body sizes, pregnant women, and vegetarians.)  Anion Gap 9  Cardiac:  08-Jul-15 08:17   Troponin I < 0.02 (0.00-0.05 0.05 ng/mL or less: NEGATIVE  Repeat testing in 3-6 hrs  if clinically indicated. >0.05 ng/mL: POTENTIAL  MYOCARDIAL INJURY. Repeat  testing in 3-6 hrs if  clinically indicated. NOTE: An increase or decrease  of 30% or more on serial  testing suggests a  clinically important change)  Routine UA:  08-Jul-15 08:35   Color (UA) Yellow  Clarity (UA) Hazy  Glucose (UA) Negative  Bilirubin (UA) Negative  Ketones (UA) Negative  Specific Gravity (UA) 1.020  Blood (UA) Negative  pH (UA) 6.0  Protein (UA) Negative  Nitrite (UA) Negative  Leukocyte Esterase (UA) Negative (Result(s) reported on 28 Oct 2013 at 08:53AM.)  RBC (UA) 3 /HPF  WBC (UA) 3 /HPF  Bacteria (UA) NONE SEEN  Epithelial Cells (UA) 8 /HPF (Result(s) reported on 28 Oct 2013 at 08:53AM.)  Routine Coag:  08-Jul-15 08:17   Prothrombin 12.2  INR 0.9 (INR reference interval applies to patients on anticoagulant therapy. A single INR therapeutic range for coumarins is not optimal for all indications; however, the suggested range for most indications is 2.0 - 3.0. Exceptions to the INR Reference Range may include: Prosthetic heart valves, acute myocardial infarction,  prevention of myocardial infarction, and combinations of aspirin and anticoagulant. The need for a higher or lower target INR must be assessed individually. Reference: The Pharmacology and Management of the Vitamin K  antagonists: the seventh ACCP Conference on Antithrombotic and Thrombolytic Therapy. OMVEH.2094 Sept:126 (3suppl): [id number]. A HCT value >55% may artifactually increase the PT.  In one study,  the increase was an average of 25%. Reference:  "Effect on Routine and Special Coagulation Testing Values of Citrate Anticoagulant Adjustment in Patients with High HCT Values." American Journal of Clinical Pathology 2006;126:400-405.)  Activated PTT (APTT) 33.0 (A HCT value >55% may artifactually increase the APTT. In one study, the increase was an average of 19%. Reference: "Effect on  Routine and Special Coagulation Testing Values of Citrate Anticoagulant Adjustment in Patients with High HCT Values." American Journal of Clinical Pathology 2006;126:400-405.)  Routine Hem:  08-Jul-15 08:17   WBC (CBC)  14.1  RBC (CBC) 4.66  Hemoglobin (CBC) 15.2  Hematocrit (CBC) 46.4  Platelet Count (CBC) 216 (Result(s) reported on 28 Oct 2013 at 08:32AM.)  MCV 100  MCH 32.5  MCHC 32.7  RDW 13.5   EKG:  EKG Interp. by me   Interpretation NSR, 87, left axis deviation, LVH, ST depression V3-V6   Radiology Results: XRay:    08-Jul-15 08:27, Chest Portable Single View  Chest Portable Single View   REASON FOR EXAM:    chest pain  COMMENTS:       PROCEDURE: DXR - DXR PORTABLE CHEST SINGLE VIEW  - Oct 28 2013  8:27AM     CLINICAL DATA:  Chest pain and shortness of Breath    EXAM:  PORTABLE CHEST - 1 VIEW    COMPARISON:  10/26/2013    FINDINGS:  Cardiac shadow is stable. The lungs are well aerated bilaterally.  Very minimal left basilar atelectasis is seen. No focal confluent  infiltrate is noted. No bony abnormality is noted.     IMPRESSION:  Minimal left basilar  atelectasis.      Electronically Signed    By: Inez Catalina M.D.    On: 10/28/2013 08:39         Verified By: Everlene Farrier, M.D.,    Zithromax: Unknown  Propulsid: Unknown  Prednisone: Unknown  Penicillin: Unknown  Augmentin: Unknown  Nexium: Unknown  Singulair: Unknown  Cefzil: Unknown  Tequin: Unknown  Prilosec: Unknown  Cheese: Unknown  Eggs: Unknown  Cherries: Unknown   Impression 1. Unstable angina: She is a 59 year old sedentary smoker with CAD. She has worsening symptoms of angina. Although this pain is different it certainly warrants further evaluation given her history and risk factors. She was scheduled for cath on 10/30/13. Will plan to go ahead and do this 10/29/13 while she is in the hospital. Echo done 10/15/13, 2/2 dyspnea showed EF 60-65%, mild concentric hypertrophy, and no regional WMA. She is on a heparin drip. Came into hospital on asapirin and Plavix. Continue Crestor. Lipid panel and A1C pending in the AM.  2. COPD: She continues to smoke. She has chronic SOB. Monitor.   3. HL: On Crestor. Fasting lipid panel in the AM.   4. Tobacco abuse: She declines smoking cessation today. She does not want to discuss this. Per PCP.  Attending note: I have seen and examined patient and agree with the H/P by Christell Faith above. she has a long hx of cigarette smoking and a hx of CAD in the past.   she now presents with chest discomfort and mildly positive Troponin levels.  we have scheduled her for cath tomorrow. she refuses to stop smoking .    we have discussed the risks, benefits, options of cardiac cath.  she understands and agrees to proceed.   Electronic Signatures: Rise Mu (PA-C)  (Signed 08-Jul-15 13:33)  Authored: General Aspect/Present Illness, History and Physical Exam, Review of System, Family & Social History, Past Medical History, Home Medications, Labs, EKG , Radiology, Allergies, Impression/Plan Nahser, Dreama Saa (MD)  (Signed 08-Jul-15  17:10)  Authored: Impression/Plan  Co-Signer: General Aspect/Present Illness, History and Physical Exam, Review of System, Family & Social History, Past Medical History, Home Medications, Labs, EKG , Radiology, Allergies, Impression/Plan   Last Updated: 08-Jul-15  17:10 by Acie Fredrickson Dreama Saa (MD)

## 2014-10-08 ENCOUNTER — Telehealth: Payer: Self-pay | Admitting: Family Medicine

## 2014-10-08 NOTE — Telephone Encounter (Signed)
Pt directed to call sleep med

## 2014-10-08 NOTE — Telephone Encounter (Signed)
PT needs to know with her CPAP does she need to use the Consentrator on the machine. The person that brought it out told her that she was to use both at the same time. Wants to know if this is correct. Please call [phone]

## 2014-10-21 ENCOUNTER — Telehealth: Payer: Self-pay | Admitting: Family Medicine

## 2014-10-21 NOTE — Telephone Encounter (Signed)
Constance from [facility] is requesting return call. States patient was set up with CPAP machine for 09-28-14. Pt would like to know if she is able to use her oxygen along with the CPAP at night? Please return patient call along with [employer]. [phone] ext [phone]

## 2014-10-21 NOTE — Telephone Encounter (Signed)
Please call them and allow to use nocturnal oxygen on CPAP . Thank you

## 2014-10-21 NOTE — Telephone Encounter (Signed)
Please advise 

## 2014-10-22 NOTE — Telephone Encounter (Signed)
Pt called needing [place] to send [facility] her sleep study and an order for CPAP supplies.  Please contact patient once this is complete.

## 2014-10-26 NOTE — Telephone Encounter (Signed)
Patient notified and faxed paperwork to [facility] for patient CPAP supplies.

## 2014-11-15 ENCOUNTER — Emergency Department
Admission: EM | Admit: 2014-11-15 | Discharge: 2014-11-15 | Discharge status: Against Medical Advice | Payer: Medicaid Other | Attending: Internal Medicine | Admitting: Internal Medicine

## 2014-11-15 ENCOUNTER — Other Ambulatory Visit: Payer: Self-pay

## 2014-11-15 ENCOUNTER — Emergency Department: Payer: Medicaid Other

## 2014-11-15 ENCOUNTER — Encounter: Payer: Self-pay | Admitting: Emergency Medicine

## 2014-11-15 DIAGNOSIS — I10 Essential (primary) hypertension: Secondary | ICD-10-CM | POA: Diagnosis not present

## 2014-11-15 DIAGNOSIS — R079 Chest pain, unspecified: Secondary | ICD-10-CM | POA: Diagnosis present

## 2014-11-15 DIAGNOSIS — Z720 Tobacco use: Secondary | ICD-10-CM | POA: Insufficient documentation

## 2014-11-15 DIAGNOSIS — Z880 Allergy status to penicillin: Secondary | ICD-10-CM | POA: Diagnosis not present

## 2014-11-15 LAB — CBC
HCT: 46.2 % (ref 35.0–47.0)
HEMOGLOBIN: 15.5 g/dL (ref 12.0–16.0)
MCH: 33 pg (ref 26.0–34.0)
MCHC: 33.5 g/dL (ref 32.0–36.0)
MCV: 98.5 fL (ref 80.0–100.0)
PLATELETS: 228 10*3/uL (ref 150–440)
RBC: 4.68 MIL/uL (ref 3.80–5.20)
RDW: 13.5 % (ref 11.5–14.5)
WBC: 12.9 10*3/uL — AB (ref 3.6–11.0)

## 2014-11-15 LAB — TROPONIN I: Troponin I: <0.03 ng/mL (ref <0.031)

## 2014-11-15 LAB — BASIC METABOLIC PANEL
ANION GAP: 9 (ref 5–15)
BUN: 7 mg/dL (ref 6–20)
CHLORIDE: 101 mmol/L (ref 101–111)
CO2: 26 mmol/L (ref 22–32)
CREATININE: 0.75 mg/dL (ref 0.44–1.00)
Calcium: 8.9 mg/dL (ref 8.9–10.3)
Glucose, Bld: 113 mg/dL — ABNORMAL HIGH (ref 65–99)
POTASSIUM: 4 mmol/L (ref 3.5–5.1)
Sodium: 136 mmol/L (ref 135–145)

## 2014-11-15 MED ORDER — ENOXAPARIN SODIUM 100 MG/ML ~~LOC~~ SOLN
1.0000 mg/kg | Freq: Once | SUBCUTANEOUS | Status: AC
  Administered 2014-11-15: 85 mg via SUBCUTANEOUS
  Filled 2014-11-15: qty 1

## 2014-11-15 MED ORDER — NITROGLYCERIN 2 % TD OINT
1.0000 [in_us] | TOPICAL_OINTMENT | Freq: Once | TRANSDERMAL | Status: AC
  Administered 2014-11-15: 1 [in_us] via TOPICAL
  Filled 2014-11-15: qty 1

## 2014-11-15 MED ORDER — INSULIN ASPART 100 UNIT/ML ~~LOC~~ SOLN: 0.0000 [IU] | Freq: Three times a day (TID) | SUBCUTANEOUS | Status: DC

## 2014-11-15 MED ORDER — NITROGLYCERIN 0.4 MG SL SUBL
0.4000 mg | SUBLINGUAL_TABLET | Freq: Once | SUBLINGUAL | Status: AC
  Administered 2014-11-15: 0.4 mg via SUBLINGUAL
  Filled 2014-11-15: qty 1

## 2014-11-15 NOTE — ED Notes (Signed)
Pt presents with chest pain started this am, "feels like it is burning".

## 2014-11-15 NOTE — ED Provider Notes (Addendum)
[facility] Emergency Department Provider Note     Time seen: ----------------------------------------- 8:26 PM on 11/15/2014 -----------------------------------------    I have reviewed the triage vital signs and the nursing notes.   HISTORY  Chief Complaint Chest Pain    HPI Barbara Bowen is a 59 y.o. female who presents ER with chest pain starting this morning. Patient states feels like it is burning.Patient states it feels identical to her prior heart attack which is a year ago for which she received a stent in her LAD. She is requesting nitroglycerin on arrival to help her feel better. Pain is burning, no other associated symptoms, no sweats nausea or shortness of breath. Pain is currently moderate to severe   Past Medical History  Diagnosis Date  . Coronary artery disease     a. s/p PCI of pRCA 09/2007.  EF 60%;  b. Cath 2012 nonobs dzs.  Marland Kitchen Hypertension   . Hyperlipidemia   . Paroxysmal atrial fibrillation   . Hx-TIA (transient ischemic attack)   . Anxiety   . COPD (chronic obstructive pulmonary disease)   . OA (osteoarthritis)   . Tobacco use disorder     a. does not believe cigarettes contribute to her dyspnea/asthma.    . Enthesopathy of hip region   . Plantar fascial fibromatosis   . Proteinuria   . DM type 2 (diabetes mellitus, type 2)   . Unspecified tinnitus   . Insomnia, unspecified   . Unspecified sleep apnea     a. Wears O2 via [place] @ HS.  . Lipoma of other skin and subcutaneous tissue   . Leukocytosis, unspecified   . Palpitations   . Occlusion and stenosis of carotid artery without mention of cerebral infarction     Patient Active Problem List   Diagnosis Date Noted  . Lump or mass in breast 09/12/2012  . Benign neoplasm of breast 09/12/2012  . Nocturnal hypoxemia due to obesity 09/09/2012  . COPD (chronic obstructive pulmonary disease) 09/09/2012  . Obstructive sleep apnea 11/02/2011  . Smoking 03/27/2011  .  Hyperlipidemia 10/28/2009  . CAD (coronary artery disease), native coronary artery 10/28/2009  . PALPITATIONS 10/28/2009  . SHORTNESS OF BREATH 10/28/2009  . CHEST PAIN UNSPECIFIED 10/28/2009    Past Surgical History  Procedure Laterality Date  . Abdominal hysterectomy    . Appendectomy    . Exploratory thoracotomy    . Mole removal    . Breast surgery Right 06-09-2012    biopsy x2 9 oclock and 11 oclock  . Cardiac catheterization  2009    s/p right coronary artery drug-eluting stent  . Coronary angioplasty  2015    Allergies Amoxicillin-pot clavulanate; Cefprozil; Eggs or egg-derived products; Esomeprazole magnesium; Montelukast sodium; Omeprazole; Penicillins; Prednisone; and Spiriva  Social History History  Substance Use Topics  . Smoking status: Current Every Day Smoker -- 1.00 packs/day for 36 years    Types: Cigarettes    Start date: 02/01/1978  . Smokeless tobacco: Never Used  . Alcohol Use: No    Review of Systems Constitutional: Negative for fever. Eyes: Negative for visual changes. ENT: Negative for sore throat. Cardiovascular: Positive for chest pain Respiratory: Negative for shortness of breath. Gastrointestinal: Negative for abdominal pain, vomiting and diarrhea. Genitourinary: Negative for dysuria. Musculoskeletal: Negative for back pain. Skin: Negative for rash. Neurological: Negative for headaches, focal weakness or numbness.  10-point ROS otherwise negative.  ____________________________________________   PHYSICAL EXAM:  VITAL SIGNS: ED Triage Vitals  Enc Vitals Group  BP 11/15/14 1831 131/62 mmHg     Pulse Rate 11/15/14 1830 75     Resp 11/15/14 1830 20     Temp 11/15/14 1830 98.5 F (36.9 C)     Temp Source 11/15/14 1830 Oral     SpO2 11/15/14 1830 96 %     Weight 11/15/14 1830 190 lb (86.183 kg)     Height 11/15/14 1830 5\' 1"  (1.549 m)     Head Cir --      Peak Flow --      Pain Score 11/15/14 1830 7     Pain Loc --      Pain  Edu? --      Excl. in [place]? --    Constitutional: Alert and oriented. Well appearing and in no distress. Eyes: Conjunctivae are normal. PERRL. Normal extraocular movements. ENT   Head: Normocephalic and atraumatic.   Nose: No congestion/rhinnorhea.   Mouth/Throat: Mucous membranes are moist.   Neck: No stridor. Cardiovascular: Normal rate, regular rhythm. Normal and symmetric distal pulses are present in all extremities. No murmurs, rubs, or gallops. Respiratory: Normal respiratory effort without tachypnea nor retractions. Breath sounds are clear and equal bilaterally. No wheezes/rales/rhonchi. Gastrointestinal: Soft and nontender. No distention. No abdominal bruits.  Musculoskeletal: Nontender with normal range of motion in all extremities. No joint effusions.  No lower extremity tenderness nor edema. Neurologic:  Normal speech and language. No gross focal neurologic deficits are appreciated. Speech is normal. No gait instability. Skin:  Skin is warm, dry and intact. No rash noted. Psychiatric: Mood and affect are normal. Speech and behavior are normal. Patient exhibits appropriate insight and judgment. ____________________________________________  EKG: Interpreted by me. Normal sinus rhythm with left axis deviation, possible LVH, septal infarct age indeterminate, normal PR interval, normal QRS with, normal QT interval.  EKG repeat reveals left axis deviation, septal infarct, minimal wall described here for LVH. Rate is 72 bpm ____________________________________________  ED COURSE:  Pertinent labs & imaging results that were available during my care of the patient were reviewed by me and considered in my medical decision making (see chart for details). Patient will need cardiac labs, chest x-ray and reevaluation. She'll receive nitroglycerin oral and topical. ____________________________________________    LABS (pertinent positives/negatives)  Labs Reviewed  BASIC  METABOLIC PANEL - Abnormal; Notable for the following:    Glucose, Bld 113 (*)    All other components within normal limits  CBC - Abnormal; Notable for the following:    WBC 12.9 (*)    All other components within normal limits  TROPONIN I    RADIOLOGY  Chest x-ray IMPRESSION: Chronic appearing basilar interstitial coarsening, probably fibrotic. No acute cardiopulmonary findings. ____________________________________________  FINAL ASSESSMENT AND PLAN  Chest pain  Plan: Patient with labs and imaging as dictated above. Patient with chest pain consistent with prior MI. She is currently in no acute distress, vitals are stable. Pain is relieved with nitroglycerin, she states this is the worst pain she's had since her stent was put in. She's been given nitroglycerin here as well as Lovenox. She is artery had her aspirin and Plavix. We will recommend observation.  Patient demanded to leave AMA, states she will call her cardiologist in the morning. She is leaving [place], I could not ensure her safety and she understands this.   Earleen Newport, MD   Earleen Newport, MD 11/15/14 2035  Earleen Newport, MD 11/15/14 2123  Earleen Newport, MD 11/15/14 2131  Algis Liming  Jimmye Norman, MD 11/15/14 2337

## 2014-11-15 NOTE — ED Notes (Signed)
Pt requesting to leave and call MD in the morning. Dr. Jimmye Norman made aware of pt request. Pt is to sign out AMA at this time. Pt aware of risk with leaving and requests to sign AMA form at this time. Husband at bedside to drive pt home.

## 2014-11-16 ENCOUNTER — Telehealth: Payer: Self-pay | Admitting: *Deleted

## 2014-11-16 NOTE — Telephone Encounter (Signed)
Pt states she is having signs of heart attack sob of any walking, and burns in chest Pt c/o of Chest Pain: STAT if CP now or developed within 24 hours  1. Are you having CP right now? At ED last night, so not right now  2. Are you experiencing any other symptoms (ex. SOB, nausea, vomiting, sweating)? No   3. How long have you been experiencing CP? Few days  4. Is your CP continuous or coming and going? Its more like a burn not a chest pain  5. Have you taken Nitroglycerin? Only did that patch last night  ?

## 2014-11-16 NOTE — Telephone Encounter (Signed)
Spoke to nurse about this and stated to patient (per [facility])  That she would need to go to the ED. Pt understood and was going to do so.

## 2014-11-29 ENCOUNTER — Ambulatory Visit: Payer: Self-pay | Admitting: Family Medicine

## 2014-11-30 ENCOUNTER — Other Ambulatory Visit: Payer: Self-pay | Admitting: Cardiovascular Disease

## 2014-12-02 ENCOUNTER — Other Ambulatory Visit: Payer: Self-pay

## 2014-12-02 ENCOUNTER — Telehealth: Payer: Self-pay

## 2014-12-02 MED ORDER — CLOPIDOGREL BISULFATE 75 MG PO TABS: ORAL_TABLET | ORAL | Status: DC

## 2014-12-02 NOTE — Telephone Encounter (Signed)
Pt called, states her chest is was burning, she went to ED and they performed bloodwork, and they sent her home. This happened a couple weeks ago. She is still experiencing the burning, states she also is experiencing an "irregular heartbeat" . Please call. Pt did schedule for 8/23 with Ignacia Bayley

## 2014-12-03 NOTE — Telephone Encounter (Signed)
S/w pt who states she has been having burning in chest since a few days before her ED visit on July 25. States [facility] wanted to admit her but pt states she signed out AMA because she states "all they were going to do was observe me". Feels like her heart is having "extra thumps"  Denies nausea, left arm pain Ran out of plavix three days ago. Refill has been sent, pt aware and states she will resume today. Confirmed appt with Ignacia Bayley, NP and reminded pt to go to ER if sweating, nausea, radiating arm pain, chest pain. Pt states she is not having those symptoms but will continue to monitor. Pt had no further questions.

## 2014-12-13 ENCOUNTER — Ambulatory Visit: Payer: Self-pay | Admitting: Family Medicine

## 2014-12-14 ENCOUNTER — Ambulatory Visit (INDEPENDENT_AMBULATORY_CARE_PROVIDER_SITE_OTHER): Payer: Medicaid Other | Admitting: Nurse Practitioner

## 2014-12-14 ENCOUNTER — Encounter: Payer: Self-pay | Admitting: Nurse Practitioner

## 2014-12-14 VITALS — BP 130/74 | HR 78 | Ht 61.0 in | Wt 224.0 lb

## 2014-12-14 DIAGNOSIS — E1165 Type 2 diabetes mellitus with hyperglycemia: Secondary | ICD-10-CM

## 2014-12-14 DIAGNOSIS — I10 Essential (primary) hypertension: Secondary | ICD-10-CM

## 2014-12-14 DIAGNOSIS — Z720 Tobacco use: Secondary | ICD-10-CM

## 2014-12-14 DIAGNOSIS — I480 Paroxysmal atrial fibrillation: Secondary | ICD-10-CM | POA: Insufficient documentation

## 2014-12-14 DIAGNOSIS — I2510 Atherosclerotic heart disease of native coronary artery without angina pectoris: Secondary | ICD-10-CM

## 2014-12-14 DIAGNOSIS — J42 Unspecified chronic bronchitis: Secondary | ICD-10-CM

## 2014-12-14 DIAGNOSIS — I82402 Acute embolism and thrombosis of unspecified deep veins of left lower extremity: Secondary | ICD-10-CM

## 2014-12-14 DIAGNOSIS — I200 Unstable angina: Secondary | ICD-10-CM

## 2014-12-14 DIAGNOSIS — M7989 Other specified soft tissue disorders: Secondary | ICD-10-CM

## 2014-12-14 DIAGNOSIS — E1129 Type 2 diabetes mellitus with other diabetic kidney complication: Secondary | ICD-10-CM | POA: Insufficient documentation

## 2014-12-14 DIAGNOSIS — IMO0002 Reserved for concepts with insufficient information to code with codable children: Secondary | ICD-10-CM | POA: Insufficient documentation

## 2014-12-14 MED ORDER — ISOSORBIDE MONONITRATE ER 30 MG PO TB24: 30.0000 mg | ORAL_TABLET | Freq: Every day | ORAL | Status: DC

## 2014-12-14 NOTE — Progress Notes (Signed)
Patient Name: Barbara Bowen Date of Encounter: 12/14/2014  Primary Care Teddie Curd:  Loistine Chance, MD Primary Cardiologist:  Johnny Bridge, MD   Chief Complaint  59 y/o female with a h/o CAD s/p prior RCA/LAD stenting, who presents today with a one month h/o [place].  Past Medical History   Past Medical History  Diagnosis Date  . Coronary artery disease     a. 09/2007 s/p PCI/DES to prox RCA;  b. 2012 Cath: nonobs dzs;  c. 01/2014 Cath/PCI: LM (anomalous) 30, LAD nl, D1 30, LCX nl, RCA 20p, 58m, 95d (3.0x23 Xience [place]), RPDA 80 (PTCA), EF 60%.  . Essential hypertension   . Hyperlipidemia   . Paroxysmal atrial fibrillation     a. not appreciated on holter 09/2013;  b. CHA2DS2VASc = 5 (htn, DM, h/o TIA, vasc dzs, female) - not on [place].  Marland Kitchen Hx-TIA (transient ischemic attack)   . Anxiety   . COPD (chronic obstructive pulmonary disease)   . OA (osteoarthritis)   . Tobacco use disorder     a. does not believe cigarettes contribute to her dyspnea/asthma.    . Enthesopathy of hip region   . Plantar fascial fibromatosis   . Proteinuria   . DM type 2 (diabetes mellitus, type 2)   . Unspecified tinnitus   . Insomnia, unspecified   . Unspecified sleep apnea     a. Wears O2 via [place] @ HS.  . Lipoma of other skin and subcutaneous tissue   . Leukocytosis, unspecified   . Palpitations   . Occlusion and stenosis of carotid artery without mention of cerebral infarction    Past Surgical History  Procedure Laterality Date  . Abdominal hysterectomy    . Appendectomy    . Exploratory thoracotomy    . Mole removal    . Breast surgery Right 06-09-2012    biopsy x2 9 oclock and 11 oclock  . Cardiac catheterization  2009    s/p right coronary artery drug-eluting stent  . Coronary angioplasty  2015    Allergies  Allergies  Allergen Reactions  . Amoxicillin-Pot Clavulanate     Vomiting   . Cefprozil     Vomiting   . Eggs Or Egg-Derived Products     Hives   . Esomeprazole Magnesium    unknown  . Montelukast Sodium     unknown  . Omeprazole     unknown  . Penicillins     Vomiting blood  . Prednisone     Rapid heart beat & difficulty breathing  . Spiriva [Tiotropium Bromide Monohydrate]     HA    HPI  59 y/o female with a h/o CAD s/p RCA (2009) and LAD (01/2014) DES placement, HTN, HL, borderline DM, obesity, and ongoing tobacco abuse.  She was in her usual state of health until roughly one month ago when she began to experience progressive, rest and exertional substernal chest pressure associated with dyspnea, lasting 5-10 mins and resolving with rest.  She was seen in the [facility] ED on 7/25 and left AMA after one normal troponin.  Unfortunately, since then, she has continued to have progressive c/p and dyspnea.  Over the same period of time, she has had to steadily reduce her activity and has been spending most of her days sitting.  In that setting, she has noticed some increase in left lower ext edema w/ tenderness over the anterior tibial area w/o erythema.  She does have a h/o lipoma in that area but she  is now concerned that she has developed a DVT.  She denies pnd, orthopnea, n, v, dizziness, syncope, or early satiety.  She continues to smoke 1 ppd and is not interested in discussing quitting.  Home Medications  Prior to Admission medications   Medication Sig Start Date End Date Taking? Authorizing Catalyna Reilly  albuterol (PROAIR HFA) 108 (90 BASE) MCG/ACT inhaler Inhale 2 puffs into the lungs every 6 (six) hours as needed.     Yes Historical Joice Nazario, MD  ALPRAZolam Duanne Moron) 0.5 MG tablet Take 0.5 mg by mouth at bedtime as needed.     Yes Historical Berline Semrad, MD  aspirin 81 MG EC tablet Take 243 mg by mouth daily.    Yes Historical Jermari Tamargo, MD  clopidogrel (PLAVIX) 75 MG tablet TAKE 1 TABLET (75 MG TOTAL) BY MOUTH DAILY. 12/02/14  Yes Minna Merritts, MD  HYDROcodone-acetaminophen (NORCO) 10-325 MG per tablet Take 1 tablet daily as needed for headaches 05/20/14  Yes  Historical Mada Sadik, MD  nebivolol (BYSTOLIC) 10 MG tablet Take 1 tablet (10 mg total) by mouth daily. 07/16/14  Yes Minna Merritts, MD  nitroGLYCERIN (NITROSTAT) 0.4 MG SL tablet Place 1 tablet (0.4 mg total) under the tongue every 5 (five) minutes as needed. 10/27/13  Yes Minna Merritts, MD  rosuvastatin (CRESTOR) 40 MG tablet Take 1 tablet (40 mg total) by mouth daily. 01/26/14  Yes Minna Merritts, MD    Family History  Family History  Problem Relation Age of Onset  . Heart disease Father     CAD  . Breast cancer Maternal Aunt   . Coronary artery disease Mother   . Diabetes type II Mother     Social History  Social History   Social History  . Marital Status: Married    Spouse Name: N/A  . Number of Children: N/A  . Years of Education: N/A   Occupational History  . unemployed    Social History Main Topics  . Smoking status: Current Every Day Smoker -- 1.00 packs/day for 37 years    Types: Cigarettes    Start date: 02/01/1978  . Smokeless tobacco: Never Used  . Alcohol Use: No  . Drug Use: No  . Sexual Activity: Not on file   Other Topics Concern  . Not on file   Social History Narrative   Lives in [place] with husband.  Does not work.  Does not routinely exercise.     Review of Systems  General:  No chills, fever, night sweats or weight changes.  Cardiovascular:  +++ chest pain, +++ dyspnea on exertion, +++ L > R lower ext edema, no orthopnea, palpitations, paroxysmal nocturnal dyspnea. Dermatological: No rash, lesions/masses Respiratory: No cough, +++ dyspnea Urologic: No hematuria, dysuria Abdominal:   No nausea, vomiting, diarrhea, bright red blood per rectum, melena, or hematemesis Neurologic:  No visual changes, wkns, changes in mental status. All other systems reviewed and are otherwise negative except as noted above.  Physical Exam  VS:  BP 130/74 mmHg  Pulse 78  Ht 5\' 1"  (1.549 m)  Wt 224 lb (101.606 kg)  BMI 42.35 kg/m2 , BMI Body mass index  is 42.35 kg/(m^2). GEN: Well nourished, well developed, in no acute distress. HEENT: normal. Neck: Supple, no JVD, carotid bruits, or masses. Cardiac: RRR, no murmurs, rubs, or gallops. No clubbing, cyanosis, edema.  Radials/DP/PT 2+ and equal bilaterally.  Respiratory:  Respirations regular and unlabored, diminished breath sounds bilat. GI: Soft, nontender, nondistended, BS + x 4. MS: no  deformity or atrophy. Skin: warm and dry, no rash. Neuro:  Strength and sensation are intact. Psych: Normal affect.  Accessory Clinical Findings  ECG - RSR, 78, LAD, LAFB - no acute st/t changes.  Assessment & Plan  1.  1.  Unstable angina/CAD:  Pt w/ h/o CAD s/p prior DES to the RCA in 2009 and more recently DES to the LAD in 01/2014.  Over the past few wks, she has noted progressive exertional c/p and DOE, which has severely limited her activity level.  She has also had c/p @ rest.  Ss are similar to prior angina.  She was seen in the ED on 7/25 2/2 c/p and left AMA after one nl troponin.  I have discussed her case with Dr. Rockey Situ and we will plan to proceed with diagnostic catheterization on 8/25.  The patient understands that risks include but are not limited to stroke (1 in 1000), death (1 in 77), kidney failure [usually temporary] (1 in 500), bleeding (1 in 200), allergic reaction [possibly serious] (1 in 200), and agrees to proceed.  Cont asa, plavix, bb, and statin therapy.  I will add imdur 30 mg daily today as well.  2.  Essential HTN:  Stable on bb.  3.  HL:  No lipids in system since 2011.  These will need to be repeated if not currently followed by primary care.  Nl lft's in march.  4.  Tobacco Abuse:  She will not discuss her tobacco abuse and is not interested in quitting.  5.  COPD:  Diminished breath sounds bilat but no active wheezing.  Advised to quit smoking.  6.  Morbid Obesity:  Activity limited by dyspnea and c/p @ this point.  Would likely benefit from cardiopulmonary  rehab.  7.  Left leg pain and swelling:  She has had a "knot" over the anterior/tibal surface of her left lower leg for a long time.  It is tender to touch and has previously been labeled a lipoma.  Over the past month, she has noted L>R lower ext edema and is very concerned about the possibility of a DVT.  She does not have a palpable cord, erythema, or calf tenderness.  Given swelling, I will obtain a LE u/s.  8.  Disposition:  Labs today.  Cath Thursday.  F/u Dr. Rockey Situ in ~ 2 wks.   Murray Hodgkins, NP 12/14/2014, 4:02 PM

## 2014-12-14 NOTE — Addendum Note (Signed)
Addended by: Rogelia Mire on: 12/14/2014 04:42 PM   Modules accepted: Orders

## 2014-12-14 NOTE — Patient Instructions (Addendum)
Medication Instructions:  Your physician has recommended you make the following change in your medication:  START taking imdur 30mg  once per day   Labwork: Your physician recommends that you have labs today: BMET, PT/INR, CBC    Testing/Procedures: Your physician has requested that you have a cardiac catheterization. Cardiac catheterization is used to diagnose and/or treat various heart conditions. Doctors may recommend this procedure for a number of different reasons. The most common reason is to evaluate chest pain. Chest pain can be a symptom of coronary artery disease (CAD), and cardiac catheterization can show whether plaque is narrowing or blocking your heart's arteries. This procedure is also used to evaluate the valves, as well as measure the blood flow and oxygen levels in different parts of your heart. For further information please visit [email or web address][email or web address]. Please follow instruction sheet, as given.  [facility] Cardiac Cath Instructions   You are scheduled for a Cardiac Cath on: Thursday, August 25, 8:30am  Please arrive at 7:30 am on the day of your procedure  Do not eat/drink anything after midnight  Someone will need to drive you home  It is recommended someone be with you for the first 24 hours after your procedure  Wear clothes that are easy to get on/off and wear slip on shoes if possible   Medications bring a current list of all medications with you  _x__ You may take all of your medications the morning of your procedure with enough water to swallow safely    Day of your procedure: Arrive at the [facility] entrance.  Free valet service is available.  After entering the [facility] please check-in at the registration desk (1st desk on your right) to receive your armband. After receiving your armband someone will escort you to the cardiac cath/special procedures waiting area.  The usual length of stay after your procedure is about 2 to 3 hours.  This can  vary.  If you have any questions, please call our office at [phone], or you may call the cardiac cath lab at [facility] directly at [phone]  Your physician has requested that you have a lower extremity arterial duplex. This test is an ultrasound of the arteries in the legs . It looks at arterial blood flow in the legs . Allow one hour for Lower  Arterial scans. There are no restrictions or special instructions  Follow-Up: Your physician recommends that you schedule a follow-up appointment in: two weeks with Dr. Rockey Situ or Christell Faith, PA-C   Any Other Special Instructions Will Be Listed Below (If Applicable).  Vascular Ultrasound Vascular ultrasound is a test to see if you have blood flow problems or clots in your veins. Ultrasound uses harmless sound waves to take pictures of the inside of your body. A device (transducer) is held up against your body to capture these pictures. The continually changing images can be recorded on videotape or film. Diagnostic ultrasound imaging may also be called sonography or ultrasonography. There are different types of vascular ultrasound exams. An aortic ultrasound can show enlargement of the artery (aneurysm) or masses. A carotid ultrasound can show if there is blockage (atherosclerosis) of the large arteries in the neck that supply most of the blood to the brain. Vascular ultrasound may be used almost anywhere throughout the body. Some of the most common sites where it is used are in the neck, heart, abdomen, and legs. There are several types of ultrasound, including:  Continuous wave Doppler ultrasound. This type of ultrasound uses the change in  pitch of the sound waves to provide information about blood flow through a blood vessel. Your health care provider listens to the sounds produced by the transducer.  Duplex ultrasound. This type of ultrasound uses standard ultrasound methods to produce a picture of a blood vessel and surrounding organs. In addition,  a computer provides added information about the speed and direction of blood flow through the blood vessel. With this type of ultrasound it is possible to see the structures inside the body and to evaluate blood flow within those structures at the same time. Blood flow in individual blood vessels is most commonly evaluated by duplex ultrasound.  Color Doppler ultrasound. This type of ultrasound uses standard ultrasound methods to produce a picture of a blood vessel. In addition, a computer converts the Doppler sounds into colors that are overlaid on the image of the blood vessel. These colors represent the speed and direction of blood flow through the vessel.  Power Doppler ultrasound. This type of ultrasound is up to 5 times more sensitive than color Doppler ultrasound. Power Doppler ultrasound can also get images that are difficult or impossible to get using standard color Doppler ultrasound. Power Doppler ultrasound is used most commonly to evaluate blood flow through vessels within organs, such as the liver or kidneys. RISKS AND COMPLICATIONS Ultrasound has been used for many years and has never shown any harmful effects. Studies in humans have shown no direct link between the use of ultrasound and future problems from the ultrasound. BEFORE THE PROCEDURE  For most Doppler ultrasound exams, no preparation is necessary.  Your health care provider may ask you not to eat, smoke, or chew gum the morning of your exam if the ultrasound scan involves your upper abdomen.  Ask your health care provider if you should take your regular medications the day of the exam. This is especially important if you are taking diabetes medicines. PROCEDURE There is no pain in an ultrasound exam. A gel is applied to your skin and the transducer is then placed on the area to be examined. The gel may feel cool. The gel wipes off easily, but it is a good idea to wear clothing that is easily washable. The images from inside  your body are displayed on one or more monitors, which look like small television screens. The room is usually darkened during the exam. This makes it easier to see the images on the monitor. The length of the exam depends on many things including the portion of your body to be examined and the complexity of your body's anatomy. An ultrasound that looks at hardening of the arteries (arteriosclerosis) may take more scanning time. AFTER THE PROCEDURE You can safely drive home and return to regular activities immediately after your exam. In many cases, follow-up exams are necessary to check on your condition or your response to therapy. Ask when your test results will be ready. Make sure you get your test results. Document Released: 04/20/2004 Document Revised: 04/14/2013 Document Reviewed: 07/02/2013 [facility] Patient Information 2015 [place], [place]. This information is not intended to replace advice given to you by your health care provider. Make sure you discuss any questions you have with your health care provider.

## 2014-12-15 ENCOUNTER — Telehealth: Payer: Self-pay

## 2014-12-15 ENCOUNTER — Telehealth: Payer: Self-pay | Admitting: Cardiovascular Disease

## 2014-12-15 ENCOUNTER — Other Ambulatory Visit: Payer: Self-pay | Admitting: Cardiovascular Disease

## 2014-12-15 LAB — CBC
Hematocrit: 45.3 % (ref 34.0–46.6)
Hemoglobin: 15.4 g/dL (ref 11.1–15.9)
MCH: 33.2 pg — AB (ref 26.6–33.0)
MCHC: 34 g/dL (ref 31.5–35.7)
MCV: 98 fL — ABNORMAL HIGH (ref 79–97)
Platelets: 235 10*3/uL (ref 150–379)
RBC: 4.64 x10E6/uL (ref 3.77–5.28)
RDW: 13.3 % (ref 12.3–15.4)
WBC: 12.7 10*3/uL — ABNORMAL HIGH (ref 3.4–10.8)

## 2014-12-15 LAB — BASIC METABOLIC PANEL
BUN/Creatinine Ratio: 11 (ref 9–23)
BUN: 8 mg/dL (ref 6–24)
CHLORIDE: 99 mmol/L (ref 97–108)
CO2: 23 mmol/L (ref 18–29)
Calcium: 9.3 mg/dL (ref 8.7–10.2)
Creatinine, Ser: 0.75 mg/dL (ref 0.57–1.00)
GFR calc non Af Amer: 89 mL/min/{1.73_m2} (ref >59)
GFR, EST AFRICAN AMERICAN: 103 mL/min/{1.73_m2} (ref >59)
GLUCOSE: 139 mg/dL — AB (ref 65–99)
Potassium: 4.7 mmol/L (ref 3.5–5.2)
SODIUM: 140 mmol/L (ref 134–144)

## 2014-12-15 LAB — PROTIME-INR
INR: 0.9 (ref 0.8–1.2)
Prothrombin Time: 9.8 s (ref 9.1–12.0)

## 2014-12-15 NOTE — Telephone Encounter (Signed)
Called pt to confirm cath tomorrow. No answer, unable to leave VM

## 2014-12-15 NOTE — Telephone Encounter (Signed)
Spoke w/ pt.  She is aware of the time change and agreeable.

## 2014-12-15 NOTE — Telephone Encounter (Signed)
Returning call from [place].  Please call again.

## 2014-12-15 NOTE — Telephone Encounter (Signed)
Attempted to contact pt to notify her that her cath on 8/25 has been moved from 2nd case to 3rd and she will need to arrive @ 8:30am.  No answer, unable to leave message.  Will try again later.

## 2014-12-15 NOTE — Telephone Encounter (Signed)
S/w pt to confirm cath tomorrow, arrival 8:30am, [facility] Reviewed instructions. Pt verbalized understanding with no further questions.

## 2014-12-16 ENCOUNTER — Encounter
Admission: RE | Disposition: A | Discharge status: Home / Self Care | Payer: Self-pay | Source: Ambulatory Visit | Attending: Cardiovascular Disease

## 2014-12-16 ENCOUNTER — Ambulatory Visit
Admission: RE | Admit: 2014-12-16 | Discharge: 2014-12-16 | Disposition: A | Discharge status: Home / Self Care | Payer: Medicaid Other | Source: Ambulatory Visit | Attending: Cardiovascular Disease | Admitting: Cardiovascular Disease

## 2014-12-16 ENCOUNTER — Encounter: Payer: Self-pay | Admitting: *Deleted

## 2014-12-16 DIAGNOSIS — G4700 Insomnia, unspecified: Secondary | ICD-10-CM | POA: Insufficient documentation

## 2014-12-16 DIAGNOSIS — Z8249 Family history of ischemic heart disease and other diseases of the circulatory system: Secondary | ICD-10-CM | POA: Diagnosis not present

## 2014-12-16 DIAGNOSIS — Z8673 Personal history of transient ischemic attack (TIA), and cerebral infarction without residual deficits: Secondary | ICD-10-CM | POA: Insufficient documentation

## 2014-12-16 DIAGNOSIS — Z888 Allergy status to other drugs, medicaments and biological substances status: Secondary | ICD-10-CM | POA: Diagnosis not present

## 2014-12-16 DIAGNOSIS — F419 Anxiety disorder, unspecified: Secondary | ICD-10-CM | POA: Insufficient documentation

## 2014-12-16 DIAGNOSIS — Z7951 Long term (current) use of inhaled steroids: Secondary | ICD-10-CM | POA: Diagnosis not present

## 2014-12-16 DIAGNOSIS — E119 Type 2 diabetes mellitus without complications: Secondary | ICD-10-CM | POA: Diagnosis not present

## 2014-12-16 DIAGNOSIS — Z955 Presence of coronary angioplasty implant and graft: Secondary | ICD-10-CM | POA: Diagnosis not present

## 2014-12-16 DIAGNOSIS — G4730 Sleep apnea, unspecified: Secondary | ICD-10-CM | POA: Diagnosis not present

## 2014-12-16 DIAGNOSIS — Z833 Family history of diabetes mellitus: Secondary | ICD-10-CM | POA: Insufficient documentation

## 2014-12-16 DIAGNOSIS — Z79899 Other long term (current) drug therapy: Secondary | ICD-10-CM | POA: Diagnosis not present

## 2014-12-16 DIAGNOSIS — I10 Essential (primary) hypertension: Secondary | ICD-10-CM | POA: Diagnosis not present

## 2014-12-16 DIAGNOSIS — IMO0002 Reserved for concepts with insufficient information to code with codable children: Secondary | ICD-10-CM

## 2014-12-16 DIAGNOSIS — H9319 Tinnitus, unspecified ear: Secondary | ICD-10-CM | POA: Diagnosis not present

## 2014-12-16 DIAGNOSIS — Z803 Family history of malignant neoplasm of breast: Secondary | ICD-10-CM | POA: Diagnosis not present

## 2014-12-16 DIAGNOSIS — I209 Angina pectoris, unspecified: Secondary | ICD-10-CM | POA: Diagnosis not present

## 2014-12-16 DIAGNOSIS — Z881 Allergy status to other antibiotic agents status: Secondary | ICD-10-CM | POA: Diagnosis not present

## 2014-12-16 DIAGNOSIS — J449 Chronic obstructive pulmonary disease, unspecified: Secondary | ICD-10-CM | POA: Diagnosis not present

## 2014-12-16 DIAGNOSIS — E785 Hyperlipidemia, unspecified: Secondary | ICD-10-CM | POA: Diagnosis not present

## 2014-12-16 DIAGNOSIS — Z91012 Allergy to eggs: Secondary | ICD-10-CM | POA: Insufficient documentation

## 2014-12-16 DIAGNOSIS — E1129 Type 2 diabetes mellitus with other diabetic kidney complication: Secondary | ICD-10-CM

## 2014-12-16 DIAGNOSIS — M1990 Unspecified osteoarthritis, unspecified site: Secondary | ICD-10-CM | POA: Insufficient documentation

## 2014-12-16 DIAGNOSIS — F17200 Nicotine dependence, unspecified, uncomplicated: Secondary | ICD-10-CM | POA: Diagnosis not present

## 2014-12-16 DIAGNOSIS — R002 Palpitations: Secondary | ICD-10-CM | POA: Insufficient documentation

## 2014-12-16 DIAGNOSIS — I6529 Occlusion and stenosis of unspecified carotid artery: Secondary | ICD-10-CM | POA: Insufficient documentation

## 2014-12-16 DIAGNOSIS — Z880 Allergy status to penicillin: Secondary | ICD-10-CM | POA: Insufficient documentation

## 2014-12-16 DIAGNOSIS — I200 Unstable angina: Secondary | ICD-10-CM

## 2014-12-16 DIAGNOSIS — I480 Paroxysmal atrial fibrillation: Secondary | ICD-10-CM | POA: Diagnosis not present

## 2014-12-16 DIAGNOSIS — Z90710 Acquired absence of both cervix and uterus: Secondary | ICD-10-CM | POA: Diagnosis not present

## 2014-12-16 DIAGNOSIS — D72829 Elevated white blood cell count, unspecified: Secondary | ICD-10-CM | POA: Insufficient documentation

## 2014-12-16 DIAGNOSIS — E1165 Type 2 diabetes mellitus with hyperglycemia: Secondary | ICD-10-CM

## 2014-12-16 DIAGNOSIS — R0602 Shortness of breath: Secondary | ICD-10-CM | POA: Diagnosis present

## 2014-12-16 DIAGNOSIS — I2510 Atherosclerotic heart disease of native coronary artery without angina pectoris: Secondary | ICD-10-CM | POA: Diagnosis present

## 2014-12-16 DIAGNOSIS — Z7982 Long term (current) use of aspirin: Secondary | ICD-10-CM | POA: Diagnosis not present

## 2014-12-16 HISTORY — DX: Cerebral infarction, unspecified: I63.9

## 2014-12-16 HISTORY — DX: Heart failure, unspecified: I50.9

## 2014-12-16 HISTORY — PX: CARDIAC CATHETERIZATION: SHX172

## 2014-12-16 HISTORY — DX: Headache, unspecified: R51.9

## 2014-12-16 HISTORY — DX: Headache: R51

## 2014-12-16 SURGERY — LEFT HEART CATH AND CORONARY ANGIOGRAPHY

## 2014-12-16 MED ORDER — MIDAZOLAM HCL 2 MG/2ML IJ SOLN
INTRAMUSCULAR | Status: AC
  Filled 2014-12-16: qty 2

## 2014-12-16 MED ORDER — FENTANYL CITRATE (PF) 100 MCG/2ML IJ SOLN
INTRAMUSCULAR | Status: DC | PRN
  Administered 2014-12-16 (×3): 50 ug via INTRAVENOUS

## 2014-12-16 MED ORDER — MIDAZOLAM HCL 2 MG/2ML IJ SOLN
INTRAMUSCULAR | Status: DC | PRN
  Administered 2014-12-16 (×3): 1 mg via INTRAVENOUS

## 2014-12-16 MED ORDER — SODIUM CHLORIDE 0.9 % WEIGHT BASED INFUSION
3.0000 mL/kg/h | INTRAVENOUS | Status: DC
  Administered 2014-12-16: 3 mL/kg/h via INTRAVENOUS

## 2014-12-16 MED ORDER — ONDANSETRON HCL 4 MG/2ML IJ SOLN: 4.0000 mg | Freq: Four times a day (QID) | INTRAMUSCULAR | Status: DC | PRN

## 2014-12-16 MED ORDER — IOHEXOL 300 MG/ML  SOLN
INTRAMUSCULAR | Status: DC | PRN
Start: 2014-12-16 — End: 2014-12-16
  Administered 2014-12-16: 30 mL via INTRA_ARTERIAL
  Administered 2014-12-16: 100 mL via INTRA_ARTERIAL

## 2014-12-16 MED ORDER — FENTANYL CITRATE (PF) 100 MCG/2ML IJ SOLN
INTRAMUSCULAR | Status: AC
  Filled 2014-12-16: qty 2

## 2014-12-16 MED ORDER — SODIUM CHLORIDE 0.9 % IJ SOLN: 3.0000 mL | INTRAMUSCULAR | Status: DC | PRN

## 2014-12-16 MED ORDER — SODIUM CHLORIDE 0.9 % IJ SOLN: 3.0000 mL | Freq: Two times a day (BID) | INTRAMUSCULAR | Status: DC

## 2014-12-16 MED ORDER — SODIUM CHLORIDE 0.9 % WEIGHT BASED INFUSION: 3.0000 mL/kg/h | INTRAVENOUS | Status: DC

## 2014-12-16 MED ORDER — ASPIRIN 81 MG PO CHEW: 81.0000 mg | CHEWABLE_TABLET | ORAL | Status: DC

## 2014-12-16 MED ORDER — SODIUM CHLORIDE 0.9 % WEIGHT BASED INFUSION: 1.0000 mL/kg/h | INTRAVENOUS | Status: DC

## 2014-12-16 MED ORDER — ACETAMINOPHEN 325 MG PO TABS: 650.0000 mg | ORAL_TABLET | ORAL | Status: DC | PRN

## 2014-12-16 MED ORDER — SODIUM CHLORIDE 0.9 % IV SOLN: 250.0000 mL | INTRAVENOUS | Status: DC | PRN

## 2014-12-16 MED ORDER — HEPARIN (PORCINE) IN NACL 2-0.9 UNIT/ML-% IJ SOLN
INTRAMUSCULAR | Status: AC
  Filled 2014-12-16: qty 1000

## 2014-12-16 SURGICAL SUPPLY — 11 items
CATH INFINITI 5 FR MPA2 (CATHETERS) ×2 IMPLANT
CATH INFINITI 5FR ANG PIGTAIL (CATHETERS) ×2 IMPLANT
CATH INFINITI 5FR JL4 (CATHETERS) ×2 IMPLANT
CATH INFINITI JR4 5F (CATHETERS) ×2 IMPLANT
DEVICE CLOSURE MYNXGRIP 5F (Vascular Products) ×2 IMPLANT
KIT MANI 3VAL PERCEP (MISCELLANEOUS) ×2 IMPLANT
NEEDLE PERC 18GX7CM (NEEDLE) ×2 IMPLANT
NEEDLE SMART 18G ACCESS (NEEDLE) ×2 IMPLANT
PACK CARDIAC CATH (CUSTOM PROCEDURE TRAY) ×2 IMPLANT
SHEATH AVANTI 5FR X 11CM (SHEATH) ×2 IMPLANT
WIRE EMERALD 3MM-J .035X150CM (WIRE) ×2 IMPLANT

## 2014-12-16 NOTE — Discharge Instructions (Signed)
Coronary Angiogram °A coronary angiogram, also called coronary angiography, is an X-ray procedure used to look at the arteries in the heart. In this procedure, a dye (contrast dye) is injected through a long, hollow tube (catheter). The catheter is about the size of a piece of cooked spaghetti and is inserted through your groin, wrist, or arm. The dye is injected into each artery, and X-rays are then taken to show if there is a blockage in the arteries of your heart. °LET YOUR HEALTH CARE PROVIDER KNOW ABOUT: °· Any allergies you have, including allergies to shellfish or contrast dye.   °· All medicines you are taking, including vitamins, herbs, eye drops, creams, and over-the-counter medicines.   °· Previous problems you or members of your family have had with the use of anesthetics.   °· Any blood disorders you have.   °· Previous surgeries you have had. °· History of kidney problems or failure.   °· Other medical conditions you have. °RISKS AND COMPLICATIONS  °Generally, a coronary angiogram is a safe procedure. However, problems can occur and include: °· Allergic reaction to the dye. °· Bleeding from the access site or other locations. °· Kidney injury, especially in people with impaired kidney function.  °· Stroke (rare). °· Heart attack (rare). °BEFORE THE PROCEDURE  °· Do not eat or drink anything after midnight the night before the procedure or as directed by your health care provider.   °· Ask your health care provider about changing or stopping your regular medicines. This is especially important if you are taking diabetes medicines or blood thinners. °PROCEDURE °· You may be given a medicine to help you relax (sedative) before the procedure. This medicine is given through an intravenous (IV) access tube that is inserted into one of your veins.   °· The area where the catheter will be inserted will be washed and shaved. This is usually done in the groin but may be done in the fold of your arm (near your  elbow) or in the wrist.    °· A medicine will be given to numb the area where the catheter will be inserted (local anesthetic).   °· The health care provider will insert the catheter into an artery. The catheter will be guided by using a special type of X-ray (fluoroscopy) of the blood vessel being examined.   °· A special dye will then be injected into the catheter, and X-rays will be taken. The dye will help to show where any narrowing or blockages are located in the heart arteries.   °AFTER THE PROCEDURE  °· If the procedure is done through the leg, you will be kept in bed lying flat for several hours. You will be instructed to not bend or cross your legs. °· The insertion site will be checked frequently.   °· The pulse in your feet or wrist will be checked frequently.   °· Additional blood tests, X-rays, and an electrocardiogram may be done.   °Document Released: 10/14/2002 Document Revised: 08/24/2013 Document Reviewed: 09/01/2012 °[employer]® Patient Information ©2015 [employer]. This information is not intended to replace advice given to you by your health care provider. Make sure you discuss any questions you have with your health care provider. ° °

## 2014-12-22 ENCOUNTER — Other Ambulatory Visit: Payer: Self-pay

## 2014-12-22 DIAGNOSIS — I82402 Acute embolism and thrombosis of unspecified deep veins of left lower extremity: Secondary | ICD-10-CM

## 2014-12-24 ENCOUNTER — Telehealth: Payer: Self-pay

## 2014-12-24 ENCOUNTER — Inpatient Hospital Stay (HOSPITAL_BASED_OUTPATIENT_CLINIC_OR_DEPARTMENT_OTHER): Payer: Medicaid Other | Admitting: Hematology and Oncology

## 2014-12-24 ENCOUNTER — Inpatient Hospital Stay: Payer: Medicaid Other | Attending: Hematology and Oncology

## 2014-12-24 ENCOUNTER — Other Ambulatory Visit: Payer: Self-pay | Admitting: Hematology and Oncology

## 2014-12-24 VITALS — BP 134/84 | HR 72 | Temp 97.0°F | Wt 220.9 lb

## 2014-12-24 DIAGNOSIS — H9319 Tinnitus, unspecified ear: Secondary | ICD-10-CM | POA: Diagnosis not present

## 2014-12-24 DIAGNOSIS — D179 Benign lipomatous neoplasm, unspecified: Secondary | ICD-10-CM | POA: Insufficient documentation

## 2014-12-24 DIAGNOSIS — E785 Hyperlipidemia, unspecified: Secondary | ICD-10-CM

## 2014-12-24 DIAGNOSIS — Z79899 Other long term (current) drug therapy: Secondary | ICD-10-CM

## 2014-12-24 DIAGNOSIS — D72828 Other elevated white blood cell count: Secondary | ICD-10-CM | POA: Insufficient documentation

## 2014-12-24 DIAGNOSIS — I10 Essential (primary) hypertension: Secondary | ICD-10-CM | POA: Diagnosis not present

## 2014-12-24 DIAGNOSIS — R5383 Other fatigue: Secondary | ICD-10-CM | POA: Insufficient documentation

## 2014-12-24 DIAGNOSIS — D72829 Elevated white blood cell count, unspecified: Secondary | ICD-10-CM

## 2014-12-24 DIAGNOSIS — Z7982 Long term (current) use of aspirin: Secondary | ICD-10-CM

## 2014-12-24 DIAGNOSIS — Z8673 Personal history of transient ischemic attack (TIA), and cerebral infarction without residual deficits: Secondary | ICD-10-CM

## 2014-12-24 DIAGNOSIS — F419 Anxiety disorder, unspecified: Secondary | ICD-10-CM

## 2014-12-24 DIAGNOSIS — M25552 Pain in left hip: Secondary | ICD-10-CM | POA: Insufficient documentation

## 2014-12-24 DIAGNOSIS — G4730 Sleep apnea, unspecified: Secondary | ICD-10-CM | POA: Diagnosis not present

## 2014-12-24 DIAGNOSIS — R05 Cough: Secondary | ICD-10-CM

## 2014-12-24 DIAGNOSIS — I2510 Atherosclerotic heart disease of native coronary artery without angina pectoris: Secondary | ICD-10-CM | POA: Insufficient documentation

## 2014-12-24 DIAGNOSIS — F17210 Nicotine dependence, cigarettes, uncomplicated: Secondary | ICD-10-CM | POA: Diagnosis not present

## 2014-12-24 DIAGNOSIS — Z803 Family history of malignant neoplasm of breast: Secondary | ICD-10-CM

## 2014-12-24 DIAGNOSIS — I252 Old myocardial infarction: Secondary | ICD-10-CM | POA: Diagnosis not present

## 2014-12-24 DIAGNOSIS — D7589 Other specified diseases of blood and blood-forming organs: Secondary | ICD-10-CM

## 2014-12-24 DIAGNOSIS — I4891 Unspecified atrial fibrillation: Secondary | ICD-10-CM

## 2014-12-24 DIAGNOSIS — E119 Type 2 diabetes mellitus without complications: Secondary | ICD-10-CM | POA: Diagnosis not present

## 2014-12-24 DIAGNOSIS — Z8701 Personal history of pneumonia (recurrent): Secondary | ICD-10-CM

## 2014-12-24 DIAGNOSIS — J449 Chronic obstructive pulmonary disease, unspecified: Secondary | ICD-10-CM

## 2014-12-24 DIAGNOSIS — D729 Disorder of white blood cells, unspecified: Secondary | ICD-10-CM | POA: Insufficient documentation

## 2014-12-24 DIAGNOSIS — R531 Weakness: Secondary | ICD-10-CM

## 2014-12-24 DIAGNOSIS — M1990 Unspecified osteoarthritis, unspecified site: Secondary | ICD-10-CM | POA: Insufficient documentation

## 2014-12-24 LAB — CBC WITH DIFFERENTIAL/PLATELET
Basophils Absolute: 0 10*3/uL (ref 0–0.1)
Basophils Relative: 0 %
Eosinophils Absolute: 0.3 10*3/uL (ref 0–0.7)
Eosinophils Relative: 2 %
HCT: 46.8 % (ref 35.0–47.0)
Hemoglobin: 16 g/dL (ref 12.0–16.0)
Lymphocytes Relative: 29 %
Lymphs Abs: 4.1 10*3/uL — ABNORMAL HIGH (ref 1.0–3.6)
MCH: 33.1 pg (ref 26.0–34.0)
MCHC: 34.1 g/dL (ref 32.0–36.0)
MCV: 96.9 fL (ref 80.0–100.0)
Monocytes Absolute: 1 10*3/uL — ABNORMAL HIGH (ref 0.2–0.9)
Monocytes Relative: 7 %
Neutro Abs: 8.7 10*3/uL — ABNORMAL HIGH (ref 1.4–6.5)
Neutrophils Relative %: 62 %
Platelets: 264 10*3/uL (ref 150–440)
RBC: 4.83 MIL/uL (ref 3.80–5.20)
RDW: 13.8 % (ref 11.5–14.5)
WBC: 14.1 10*3/uL — ABNORMAL HIGH (ref 3.6–11.0)

## 2014-12-24 LAB — SEDIMENTATION RATE: Sed Rate: 18 mm/hr (ref 0–30)

## 2014-12-24 LAB — TSH: TSH: 6.889 u[IU]/mL — ABNORMAL HIGH (ref 0.350–4.500)

## 2014-12-24 LAB — VITAMIN B12: Vitamin B-12: 359 pg/mL (ref 180–914)

## 2014-12-24 LAB — FOLATE: Folate: 11.5 ng/mL (ref >5.9)

## 2014-12-24 NOTE — Telephone Encounter (Signed)
Tried to call pt back per pt request with lab results at [phone].  No way to leave message for pt.  Labs sent to pcp per md request

## 2014-12-24 NOTE — Progress Notes (Signed)
Patient offers no complaints today. 

## 2014-12-24 NOTE — Progress Notes (Signed)
[facility] day:  12/24/2014  Chief Complaint: Barbara Bowen is a 59 y.o. female with leukocytosis who is seen for reassessment.   HPI: The patient states that her white count has been elevated since she was a teenager and before she started smoking. Specifically, she was told her white count was elevated at the age of 33 with her first pregnancy.  She states her white count is typically between 15,000 and 30,000.   She has a long-standing history of tobacco use.  She has smoked 1-2 packs a day for 30 years. She does not want to cut back. She has had 3-4 myocardial infarctions and 2 stents placed.  She states that her last chest x-ray was on Monday. Review of data from Epic reveals a chest x-ray on 11/15/2014. At that time, she had chronic appearing basilar interstitial coarsening, probably fibrotic. She recently underwent cardiac catheterization for recurrent chest pain. She states that her blockages were "not big enough to fix".  She denies any use of the prednisone or inhaled steroid. She's had no issues with infections. Her last pneumonia was approximately 3-4 years ago.   She was last seen in the medical oncology clinic on 12/23/2013 by Dr. Concha Norway. Notes indicate that flow cytometry for CLL was negative. CBC revealed a hematocrit of 45.7, hemoglobin 15.3, MCV 101, platelets  238,000, white count 12,400 with an ANC of 6800. He recommended that she follow-up annually.  Patient has a family history of breast cancer. Her aunt had a double mastectomy.  Patient's last mammogram was about 3-4 years ago.   Symptomatically, she notes being fatigued. She has a chronic cough. She has poor sleep and sleeps only about 2 hours at a time. She has some left hip pain as she fell down the steps.   Past Medical History  Diagnosis Date  . Coronary artery disease     a. 09/2007 s/p PCI/DES to prox RCA;  b. 2012 Cath: nonobs dzs;  c. 01/2014 Cath/PCI: LM  (anomalous) 30, LAD nl, D1 30, LCX nl, RCA 20p, 74m, 95d (3.0x23 Xience [place]), RPDA 80 (PTCA), EF 60%.  . Essential hypertension   . Hyperlipidemia   . Paroxysmal atrial fibrillation     a. not appreciated on holter 09/2013;  b. CHA2DS2VASc = 5 (htn, DM, h/o TIA, vasc dzs, female) - not on [place].  Marland Kitchen Hx-TIA (transient ischemic attack)   . Anxiety   . COPD (chronic obstructive pulmonary disease)   . OA (osteoarthritis)   . Tobacco use disorder     a. does not believe cigarettes contribute to her dyspnea/asthma.    . Enthesopathy of hip region   . Plantar fascial fibromatosis   . Proteinuria   . DM type 2 (diabetes mellitus, type 2)   . Unspecified tinnitus   . Insomnia, unspecified   . Unspecified sleep apnea     a. Wears O2 via [place] @ HS.  . Lipoma of other skin and subcutaneous tissue   . Leukocytosis, unspecified   . Palpitations   . Occlusion and stenosis of carotid artery without mention of cerebral infarction   . Myocardial infarction   . Anginal pain   . Stroke   . CHF (congestive heart failure)   . Headache     Past Surgical History  Procedure Laterality Date  . Abdominal hysterectomy    . Appendectomy    . Exploratory thoracotomy    . Mole removal    .  Breast surgery Right 06-09-2012    biopsy x2 9 oclock and 11 oclock  . Cardiac catheterization  2009    s/p right coronary artery drug-eluting stent  . Coronary angioplasty  2015  . Cardiac catheterization N/A 12/16/2014    Procedure: Left Heart Cath and Coronary Angiography;  Surgeon: Minna Merritts, MD;  Location: [facility];  Service: Cardiovascular;  Laterality: N/A;    Family History  Problem Relation Age of Onset  . Heart disease Father     CAD  . Breast cancer Maternal Aunt   . Coronary artery disease Mother   . Diabetes type II Mother     Social History:  reports that she has been smoking Cigarettes.  She started smoking about 36 years ago. She has a 74 pack-year smoking history. She has never  used smokeless tobacco. She reports that she does not drink alcohol or use illicit drugs.  The patient is alone today.  Allergies:  Allergies  Allergen Reactions  . Amoxicillin-Pot Clavulanate     Vomiting   . Cefprozil     Vomiting   . Eggs Or Egg-Derived Products     Hives   . Esomeprazole Magnesium     unknown  . Montelukast Sodium     unknown  . Omeprazole     unknown  . Penicillins     Vomiting blood  . Prednisone     Rapid heart beat & difficulty breathing  . Spiriva [Tiotropium Bromide Monohydrate]     HA    Current Medications: Current Outpatient Prescriptions  Medication Sig Dispense Refill  . albuterol (PROAIR HFA) 108 (90 BASE) MCG/ACT inhaler Inhale 2 puffs into the lungs every 6 (six) hours as needed.      . ALPRAZolam (XANAX) 0.5 MG tablet Take 0.5 mg by mouth at bedtime as needed.      Marland Kitchen aspirin 81 MG EC tablet Take 243 mg by mouth daily.     . clopidogrel (PLAVIX) 75 MG tablet TAKE 1 TABLET (75 MG TOTAL) BY MOUTH DAILY. 90 tablet 3  . HYDROcodone-acetaminophen (NORCO) 10-325 MG per tablet Take 1 tablet daily as needed for headaches  0  . isosorbide mononitrate (IMDUR) 30 MG 24 hr tablet Take 1 tablet (30 mg total) by mouth daily. 30 tablet 3  . nebivolol (BYSTOLIC) 10 MG tablet Take 1 tablet (10 mg total) by mouth daily. 90 tablet 3  . nitroGLYCERIN (NITROSTAT) 0.4 MG SL tablet Place 1 tablet (0.4 mg total) under the tongue every 5 (five) minutes as needed. 25 tablet 6  . rosuvastatin (CRESTOR) 40 MG tablet Take 1 tablet (40 mg total) by mouth daily. 90 tablet 3   No current facility-administered medications for this visit.    Review of Systems:  GENERAL:  Feels tired.  No fevers, sweats or weight loss. PERFORMANCE STATUS (ECOG):  1-2 HEENT:  No visual changes, runny nose, sore throat, mouth sores or tenderness. Lungs: Chronic cough.  No shortness of breath.  No hemoptysis. Cardiac:  No chest pain, palpitations, orthopnea, or PND. GI:  No nausea,  vomiting, diarrhea, constipation, melena or hematochezia. GU:  No urgency, frequency, dysuria, or hematuria. Musculoskeletal:  No back pain.  Hip pain.  No muscle tenderness. Extremities:  No pain or swelling. Skin:  No rashes or skin changes. Neuro:  Little dizzy.  No headache, numbness or weakness, balance or coordination issues. Endocrine:  No diabetes, thyroid issues, hot flashes or night sweats. Psych:  No mood changes, depression or  anxiety.  Poor sleep. Pain:  No focal pain. Review of systems:  All other systems reviewed and found to be negative.  Physical Exam: Blood pressure 134/84, pulse 72, temperature 97 F (36.1 C), temperature source Tympanic, weight 220 lb 14.4 oz (100.2 kg). GENERAL:  Well developed, well nourished, sitting comfortably in the exam room in no acute distress.  She smells of tobacco. MENTAL STATUS:  Alert and oriented to person, place and time. HEAD:  Normocephalic, atraumatic, face symmetric, no Cushingoid features. EYES:  Glasses.  Brown eyes.  Pupils equal round and reactive to light and accomodation.  No conjunctivitis or scleral icterus. ENT:  Oropharynx clear without lesion.  Dentures.  Tongue normal. Mucous membranes moist.  RESPIRATORY:  Clear to auscultation without rales, wheezes or rhonchi. CARDIOVASCULAR:  Regular rate and rhythm without murmur, rub or gallop. ABDOMEN:  Right groin tender s/p cardiac cath.  Soft, non-tender, with active bowel sounds, and no hepatosplenomegaly.  No masses. SKIN:  No rashes, ulcers or lesions. EXTREMITIES: Lower extremity chronic changes.  No skin discoloration or tenderness.  No palpable cords. LYMPH NODES: No palpable cervical, supraclavicular, axillary or inguinal adenopathy  NEUROLOGICAL: Unremarkable. PSYCH:  Appropriate.  Appointment on 12/24/2014  Component Date Value Ref Range Status  . WBC 12/24/2014 14.1* 3.6 - 11.0 K/uL Final  . RBC 12/24/2014 4.83  3.80 - 5.20 MIL/uL Final  . Hemoglobin 12/24/2014  16.0  12.0 - 16.0 g/dL Final  . HCT 12/24/2014 46.8  35.0 - 47.0 % Final  . MCV 12/24/2014 96.9  80.0 - 100.0 fL Final  . MCH 12/24/2014 33.1  26.0 - 34.0 pg Final  . MCHC 12/24/2014 34.1  32.0 - 36.0 g/dL Final  . RDW 12/24/2014 13.8  11.5 - 14.5 % Final  . Platelets 12/24/2014 264  150 - 440 K/uL Final  . Neutrophils Relative % 12/24/2014 62   Final  . Neutro Abs 12/24/2014 8.7* 1.4 - 6.5 K/uL Final  . Lymphocytes Relative 12/24/2014 29   Final  . Lymphs Abs 12/24/2014 4.1* 1.0 - 3.6 K/uL Final  . Monocytes Relative 12/24/2014 7   Final  . Monocytes Absolute 12/24/2014 1.0* 0.2 - 0.9 K/uL Final  . Eosinophils Relative 12/24/2014 2   Final  . Eosinophils Absolute 12/24/2014 0.3  0 - 0.7 K/uL Final  . Basophils Relative 12/24/2014 0   Final  . Basophils Absolute 12/24/2014 0.0  0 - 0.1 K/uL Final    Assessment:  Barbara Bowen is a 59 y.o. female with chronic leukocytosis dating back to 35 years per patient's history.  White count has varied between 15,000 and 30,000 (per patient report) without trend. Differential is predominantly neutrophils. Flow cytometry revealed no evidence of CLL.  Leukocytosis is reactive and likely related to her smoking history.  She denies any recurrent infections or use of steroids.  She has a history of macrocytic RBC indices.  She has a 30-60 pack year smoking history. She continues to smoke.  She has had 3-4 myocardial infarctions.  She has not had a mammogram in awhile.  Symptomatically, she notes fatigue. She has recently had a cardiac catheterization.  Plan: 1. Review medical history, diagnosis of a chronic leukocytosis and likely underlying etiology. 2. Labs today:  CBC with diff, ESR, B12, folate, TSH. 3. Nurse to contact patient with lab results [phone]). 4. Encourage smoking cessation. 5. Encourage follow-up mammogram. 6. RTC prn.   Lequita Asal, MD  12/24/2014, 10:52 AM

## 2014-12-27 ENCOUNTER — Encounter: Payer: Self-pay | Admitting: Hematology and Oncology

## 2014-12-29 ENCOUNTER — Ambulatory Visit (INDEPENDENT_AMBULATORY_CARE_PROVIDER_SITE_OTHER): Payer: Medicaid Other | Admitting: Family Medicine

## 2014-12-29 ENCOUNTER — Encounter: Payer: Self-pay | Admitting: Family Medicine

## 2014-12-29 VITALS — BP 136/82 | HR 82 | Temp 98.4°F | Resp 18 | Ht 61.75 in | Wt 224.4 lb

## 2014-12-29 DIAGNOSIS — Z23 Encounter for immunization: Secondary | ICD-10-CM | POA: Diagnosis not present

## 2014-12-29 DIAGNOSIS — I25110 Atherosclerotic heart disease of native coronary artery with unstable angina pectoris: Secondary | ICD-10-CM

## 2014-12-29 DIAGNOSIS — G4733 Obstructive sleep apnea (adult) (pediatric): Secondary | ICD-10-CM | POA: Diagnosis not present

## 2014-12-29 DIAGNOSIS — J449 Chronic obstructive pulmonary disease, unspecified: Secondary | ICD-10-CM | POA: Diagnosis not present

## 2014-12-29 DIAGNOSIS — G43009 Migraine without aura, not intractable, without status migrainosus: Secondary | ICD-10-CM | POA: Diagnosis not present

## 2014-12-29 DIAGNOSIS — G4700 Insomnia, unspecified: Secondary | ICD-10-CM

## 2014-12-29 DIAGNOSIS — E1159 Type 2 diabetes mellitus with other circulatory complications: Secondary | ICD-10-CM | POA: Diagnosis not present

## 2014-12-29 DIAGNOSIS — E1121 Type 2 diabetes mellitus with diabetic nephropathy: Secondary | ICD-10-CM | POA: Diagnosis not present

## 2014-12-29 DIAGNOSIS — E785 Hyperlipidemia, unspecified: Secondary | ICD-10-CM | POA: Diagnosis not present

## 2014-12-29 DIAGNOSIS — D72829 Elevated white blood cell count, unspecified: Secondary | ICD-10-CM

## 2014-12-29 DIAGNOSIS — B353 Tinea pedis: Secondary | ICD-10-CM | POA: Diagnosis not present

## 2014-12-29 LAB — POCT UA - MICROALBUMIN: Microalbumin Ur, POC: 20 mg/L

## 2014-12-29 LAB — POCT GLYCOSYLATED HEMOGLOBIN (HGB A1C): Hemoglobin A1C: 7

## 2014-12-29 MED ORDER — NAFTIFINE HCL 1 % EX CREA: TOPICAL_CREAM | Freq: Every day | CUTANEOUS | Status: DC

## 2014-12-29 MED ORDER — ALPRAZOLAM 0.5 MG PO TABS: 0.5000 mg | ORAL_TABLET | Freq: Two times a day (BID) | ORAL | Status: DC | PRN

## 2014-12-29 MED ORDER — HYDROCODONE-ACETAMINOPHEN 10-325 MG PO TABS: 1.0000 | ORAL_TABLET | Freq: Four times a day (QID) | ORAL | Status: DC | PRN

## 2014-12-29 NOTE — Progress Notes (Signed)
Name: Barbara Bowen   MRN: [id number]    DOB: 02/18/1956   Date:12/29/2014       Progress Note  Subjective  Chief Complaint  Chief Complaint  Patient presents with  . Medication Refill    4 month F/U  . COPD    always has cough,sob and wheezing  . Diabetes  . Hypertension    chest pain, just had a cath   . Migraine  . Hyperlipidemia    HPI  DMII: with vascular disease and diabetic nephropathy. She is currently on diet only, hgbA1C 7.0, she is terrified of trying new medications, she wants to make sure she follows a diet to keep it down, unable to exercise because of COPD/CAD.  She denies polyphagia, polyuria or polydipsia . Explained importance of starting ACE/ARB but she refuses it.   CAD: recent had a cath done by Dr. Rockey Situ, stents still open, no significant heart disease, she is on medication managements at this time. She describes pain on chest as a burning sensation, discussed GERD but she is allergic to Nexium and states she failed GI cocktail in the past, her MI presented with burning sensation in her chest.   HTN: taking betablocker, advised to add ACE/ARB but she refuses starting a new medication  Migraine headaches: episodes about 10 monthly, seen by neurologist but per patient can't tolerate any prophylactic medication, the only thing that works for her is hydrodocone prn, 30 pills to last 90 days.   Insomnia: she denies depression or anxiety, takes alprazolam for sleep, it does not last all night. Advised to try something else, such as seroquel , Ambient and Trazodone, but she refuses also.   COPD: allergic to Spiriva, uses nocturnal oxygen, still smoking, has daily symptoms. She refuses taking any medication. Symbicort and Advair causes palpitation and does not want to try anything else.    Patient Active Problem List   Diagnosis Date Noted  . Insomnia 12/29/2014  . Migraine without aura and without status migrainosus, not intractable 12/29/2014  . Leukocytosis  12/24/2014  . Macrocytic 12/24/2014  . Unstable angina   . Essential hypertension   . Paroxysmal atrial fibrillation   . Diabetes mellitus with renal manifestation   . History of MI (myocardial infarction) 10/30/2013  . Benign neoplasm of breast 09/12/2012  . Nocturnal hypoxemia due to obesity 09/09/2012  . COPD (chronic obstructive pulmonary disease) 09/09/2012  . Obstructive sleep apnea 11/02/2011  . Smoking 03/27/2011  . Hyperlipidemia 10/28/2009  . CAD (coronary artery disease), native coronary artery 10/28/2009  . PALPITATIONS 10/28/2009  . Shortness of breath 10/28/2009  . Angina pectoris 10/28/2009    Past Surgical History  Procedure Laterality Date  . Abdominal hysterectomy    . Appendectomy    . Exploratory thoracotomy    . Mole removal    . Breast surgery Right 06-09-2012    biopsy x2 9 oclock and 11 oclock  . Cardiac catheterization  2009    s/p right coronary artery drug-eluting stent  . Coronary angioplasty  2015  . Cardiac catheterization N/A 12/16/2014    Procedure: Left Heart Cath and Coronary Angiography;  Surgeon: Minna Merritts, MD;  Location: [facility];  Service: Cardiovascular;  Laterality: N/A;    Family History  Problem Relation Age of Onset  . Heart disease Father     CAD  . Breast cancer Maternal Aunt   . Coronary artery disease Mother   . Diabetes type II Mother     Social  History   Social History  . Marital Status: Married    Spouse Name: N/A  . Number of Children: N/A  . Years of Education: N/A   Occupational History  . unemployed    Social History Main Topics  . Smoking status: Current Every Day Smoker -- 2.00 packs/day for 37 years    Types: Cigarettes    Start date: 02/01/1978  . Smokeless tobacco: Never Used  . Alcohol Use: No  . Drug Use: No  . Sexual Activity: Not Currently   Other Topics Concern  . Not on file   Social History Narrative   Lives in [place] with husband.  Does not work.  Does not routinely  exercise.     Current outpatient prescriptions:  .  albuterol (PROAIR HFA) 108 (90 BASE) MCG/ACT inhaler, Inhale 2 puffs into the lungs every 6 (six) hours as needed.  , Disp: , Rfl:  .  ALPRAZolam (XANAX) 0.5 MG tablet, Take 1 tablet (0.5 mg total) by mouth 2 (two) times daily as needed., Disp: 60 tablet, Rfl: 2 .  aspirin 81 MG EC tablet, Take 243 mg by mouth daily. , Disp: , Rfl:  .  clopidogrel (PLAVIX) 75 MG tablet, TAKE 1 TABLET (75 MG TOTAL) BY MOUTH DAILY., Disp: 90 tablet, Rfl: 3 .  HYDROcodone-acetaminophen (NORCO) 10-325 MG per tablet, Take 1 tablet by mouth every 6 (six) hours as needed. Take 1 tablet daily as needed for headaches, Disp: 30 tablet, Rfl: 0 .  isosorbide mononitrate (IMDUR) 30 MG 24 hr tablet, Take 1 tablet (30 mg total) by mouth daily., Disp: 30 tablet, Rfl: 3 .  nebivolol (BYSTOLIC) 10 MG tablet, Take 1 tablet (10 mg total) by mouth daily., Disp: 90 tablet, Rfl: 3 .  nitroGLYCERIN (NITROSTAT) 0.4 MG SL tablet, Place 1 tablet (0.4 mg total) under the tongue every 5 (five) minutes as needed., Disp: 25 tablet, Rfl: 6 .  rosuvastatin (CRESTOR) 40 MG tablet, Take 1 tablet (40 mg total) by mouth daily., Disp: 90 tablet, Rfl: 3  Allergies  Allergen Reactions  . Amoxicillin-Pot Clavulanate     Vomiting   . Cefprozil     Vomiting   . Eggs Or Egg-Derived Products     Hives   . Esomeprazole Magnesium     unknown  . Montelukast Sodium     unknown  . Omeprazole     unknown  . Penicillins     Vomiting blood  . Prednisone     Rapid heart beat & difficulty breathing  . Spiriva [Tiotropium Bromide Monohydrate]     HA     ROS  Constitutional: Negative for fever or weight change.  Respiratory: Positive for cough and shortness of breath.   Cardiovascular: Positive for chest pain and  palpitations.  Gastrointestinal: Negative for abdominal pain, no bowel changes.  Musculoskeletal: Positive  for gait problem no  joint swelling.  Skin: Positive  for rash on feet.   Neurological: Negative for dizziness , intermittent  headache.  No other specific complaints in a complete review of systems (except as listed in HPI above).  Objective  Filed Vitals:   12/29/14 1050  BP: 136/82  Pulse: 82  Temp: 98.4 F (36.9 C)  TempSrc: Oral  Resp: 18  Height: 5' 1.75" (1.568 m)  Weight: 224 lb 6.4 oz (101.787 kg)  SpO2: 94%    Body mass index is 41.4 kg/(m^2).  Physical Exam  Constitutional: Patient appears well-developed and well-nourished. Obese No distress.  HEENT: head atraumatic,  normocephalic, pupils equal and reactive to light neck supple, throat within normal limits Cardiovascular: Normal rate, regular rhythm and normal heart sounds.  No murmur heard. Trace BLE edema. Pulmonary/Chest: Effort normal and breath sounds normal. No respiratory distress. Abdominal: Soft.  There is no tenderness. Psychiatric: Patient has a normal mood and affect. behavior is normal. Judgment and thought content normal. Skin: maceration and pilling of feet  Recent Results (from the past 2160 hour(s))  Basic metabolic panel     Status: Abnormal   Collection Time: 11/15/14  6:33 PM  Result Value Ref Range   Sodium 136 135 - 145 mmol/L   Potassium 4.0 3.5 - 5.1 mmol/L   Chloride 101 101 - 111 mmol/L   CO2 26 22 - 32 mmol/L   Glucose, Bld 113 (H) 65 - 99 mg/dL   BUN 7 6 - 20 mg/dL   Creatinine, Ser 0.75 0.44 - 1.00 mg/dL   Calcium 8.9 8.9 - 10.3 mg/dL   GFR calc non Af Amer >60 >60 mL/min   GFR calc Af Amer >60 >60 mL/min    Comment: (NOTE) The eGFR has been calculated using the CKD EPI equation. This calculation has not been validated in all clinical situations. eGFR's persistently <60 mL/min signify possible Chronic Kidney Disease.    Anion gap 9 5 - 15  CBC     Status: Abnormal   Collection Time: 11/15/14  6:33 PM  Result Value Ref Range   WBC 12.9 (H) 3.6 - 11.0 K/uL   RBC 4.68 3.80 - 5.20 MIL/uL   Hemoglobin 15.5 12.0 - 16.0 g/dL   HCT 46.2 35.0 - 47.0  %   MCV 98.5 80.0 - 100.0 fL   MCH 33.0 26.0 - 34.0 pg   MCHC 33.5 32.0 - 36.0 g/dL   RDW 13.5 11.5 - 14.5 %   Platelets 228 150 - 440 K/uL  Troponin I     Status: None   Collection Time: 11/15/14  8:28 PM  Result Value Ref Range   Troponin I <0.03 <0.031 ng/mL    Comment:        NO INDICATION OF MYOCARDIAL INJURY.   Basic Metabolic Panel (BMET)     Status: Abnormal   Collection Time: 12/14/14  4:30 PM  Result Value Ref Range   Glucose 139 (H) 65 - 99 mg/dL   BUN 8 6 - 24 mg/dL   Creatinine, Ser 0.75 0.57 - 1.00 mg/dL   GFR calc non Af Amer 89 >59 mL/min/1.73   GFR calc Af Amer 103 >59 mL/min/1.73   BUN/Creatinine Ratio 11 9 - 23   Sodium 140 134 - 144 mmol/L   Potassium 4.7 3.5 - 5.2 mmol/L   Chloride 99 97 - 108 mmol/L   CO2 23 18 - 29 mmol/L   Calcium 9.3 8.7 - 10.2 mg/dL  INR/PT     Status: None   Collection Time: 12/14/14  4:30 PM  Result Value Ref Range   INR 0.9 0.8 - 1.2    Comment: Reference interval is for non-anticoagulated patients. Suggested INR therapeutic range for Vitamin K antagonist therapy:    Standard Dose (moderate intensity                   therapeutic range):       2.0 - 3.0    Higher intensity therapeutic range       2.5 - 3.5    Prothrombin Time 9.8 9.1 - 12.0 sec  CBC  Status: Abnormal   Collection Time: 12/14/14  4:30 PM  Result Value Ref Range   WBC 12.7 (H) 3.4 - 10.8 x10E3/uL   RBC 4.64 3.77 - 5.28 x10E6/uL   Hemoglobin 15.4 11.1 - 15.9 g/dL   Hematocrit 45.3 34.0 - 46.6 %   MCV 98 (H) 79 - 97 fL   MCH 33.2 (H) 26.6 - 33.0 pg   MCHC 34.0 31.5 - 35.7 g/dL   RDW 13.3 12.3 - 15.4 %   Platelets 235 150 - 379 x10E3/uL  CBC with Differential     Status: Abnormal   Collection Time: 12/24/14 10:20 AM  Result Value Ref Range   WBC 14.1 (H) 3.6 - 11.0 K/uL   RBC 4.83 3.80 - 5.20 MIL/uL   Hemoglobin 16.0 12.0 - 16.0 g/dL   HCT 46.8 35.0 - 47.0 %   MCV 96.9 80.0 - 100.0 fL   MCH 33.1 26.0 - 34.0 pg   MCHC 34.1 32.0 - 36.0 g/dL   RDW  13.8 11.5 - 14.5 %   Platelets 264 150 - 440 K/uL   Neutrophils Relative % 62 %   Neutro Abs 8.7 (H) 1.4 - 6.5 K/uL   Lymphocytes Relative 29 %   Lymphs Abs 4.1 (H) 1.0 - 3.6 K/uL   Monocytes Relative 7 %   Monocytes Absolute 1.0 (H) 0.2 - 0.9 K/uL   Eosinophils Relative 2 %   Eosinophils Absolute 0.3 0 - 0.7 K/uL   Basophils Relative 0 %   Basophils Absolute 0.0 0 - 0.1 K/uL  Sedimentation rate     Status: None   Collection Time: 12/24/14 10:20 AM  Result Value Ref Range   Sed Rate 18 0 - 30 mm/hr  Folate     Status: None   Collection Time: 12/24/14 10:20 AM  Result Value Ref Range   Folate 11.5 >5.9 ng/mL  TSH     Status: Abnormal   Collection Time: 12/24/14 10:20 AM  Result Value Ref Range   TSH 6.889 (H) 0.350 - 4.500 uIU/mL  Vitamin B12     Status: None   Collection Time: 12/24/14 10:21 AM  Result Value Ref Range   Vitamin B-12 359 180 - 914 pg/mL    Comment: (NOTE) This assay is not validated for testing neonatal or myeloproliferative syndrome specimens for Vitamin B12 levels. Performed at [facility]   POCT HgB A1C     Status: None   Collection Time: 12/29/14 10:56 AM  Result Value Ref Range   Hemoglobin A1C 7.0   POCT UA - Microalbumin     Status: None   Collection Time: 12/29/14 10:57 AM  Result Value Ref Range   Microalbumin Ur, POC 20 mg/L   Creatinine, POC  mg/dL   Albumin/Creatinine Ratio, Urine, POC      Diabetic Foot Exam: Diabetic Foot Exam - Simple   Simple Foot Form  Visual Inspection  See comments:  Yes  Sensation Testing  Intact to touch and monofilament testing bilaterally:  Yes  Pulse Check  Posterior Tibialis and Dorsalis pulse intact bilaterally:  Yes  Comments  Dry skin, thick toenails , maceration on toe webs       PHQ2/9: Depression screen PHQ 2/9 12/29/2014  Decreased Interest 0  Down, Depressed, Hopeless 0  PHQ - 2 Score 0     Fall Risk: Fall Risk  12/29/2014  Falls in the past year? No      Assessment &  Plan  1. Type 2 diabetes mellitus  with other circulatory complications Discussed diet and exercise - POCT HgB A1C - POCT UA - Microalbumin  2. Needs flu shot  - Flu Vaccine QUAD 36+ mos PF IM (Fluarix & Fluzone Quad PF)  3. Migraine without aura and without status migrainosus, not intractable Continue prn medication, must last 90 days - HYDROcodone-acetaminophen (NORCO) 10-325 MG per tablet; Take 1 tablet by mouth every 6 (six) hours as needed. Take 1 tablet daily as needed for headaches  Dispense: 30 tablet; Refill: 0  4. Coronary artery disease involving native coronary artery of native heart with unstable angina pectoris Continue follow up with Dr. Rockey Situ and also oral medications  5. Type 2 diabetes mellitus with diabetic nephropathy Needs ACE/ ARB but she refuses starting on new medication   6. Tinea pedis of both feet We will start on Naftin topically, needs to discuss it with podiatrist also needs nails trimmed  7. Hyperlipidemia Lipid panel shows low HDL : to improve HDL patient  needs to eat , eat fish two times weekly  and exercise  at least 150 minutes per week  8. Chronic obstructive pulmonary disease, unspecified COPD, unspecified chronic bronchitis type Discussed again referral to pulmonologist but she refuses to go or try a new medication on oxygen at night, needs to stop smoking  9. Leukocytosis Seen by hematologist recently, labs done  10. Obstructive sleep apnea Continue CPAP every night  11. Insomnia Discussed other medications, but she refuses to change, afraid of side effects - ALPRAZolam (XANAX) 0.5 MG tablet; Take 1 tablet (0.5 mg total) by mouth 2 (two) times daily as needed.  Dispense: 60 tablet; Refill: 2

## 2014-12-30 ENCOUNTER — Other Ambulatory Visit: Payer: Self-pay | Admitting: Nurse Practitioner

## 2014-12-30 ENCOUNTER — Ambulatory Visit (INDEPENDENT_AMBULATORY_CARE_PROVIDER_SITE_OTHER): Payer: Medicaid Other

## 2014-12-30 DIAGNOSIS — I82402 Acute embolism and thrombosis of unspecified deep veins of left lower extremity: Secondary | ICD-10-CM

## 2014-12-30 DIAGNOSIS — M79605 Pain in left leg: Secondary | ICD-10-CM | POA: Diagnosis not present

## 2015-01-04 ENCOUNTER — Ambulatory Visit (INDEPENDENT_AMBULATORY_CARE_PROVIDER_SITE_OTHER): Payer: Medicaid Other | Admitting: Nurse Practitioner

## 2015-01-04 ENCOUNTER — Telehealth: Payer: Self-pay | Admitting: Family Medicine

## 2015-01-04 ENCOUNTER — Encounter: Payer: Self-pay | Admitting: Nurse Practitioner

## 2015-01-04 VITALS — BP 102/62 | HR 77 | Ht 61.0 in | Wt 224.0 lb

## 2015-01-04 DIAGNOSIS — Z720 Tobacco use: Secondary | ICD-10-CM

## 2015-01-04 DIAGNOSIS — I25118 Atherosclerotic heart disease of native coronary artery with other forms of angina pectoris: Secondary | ICD-10-CM

## 2015-01-04 DIAGNOSIS — E785 Hyperlipidemia, unspecified: Secondary | ICD-10-CM | POA: Diagnosis not present

## 2015-01-04 DIAGNOSIS — I10 Essential (primary) hypertension: Secondary | ICD-10-CM | POA: Diagnosis not present

## 2015-01-04 MED ORDER — ROSUVASTATIN CALCIUM 40 MG PO TABS: 40.0000 mg | ORAL_TABLET | Freq: Every day | ORAL | Status: DC

## 2015-01-04 MED ORDER — NEBIVOLOL HCL 10 MG PO TABS: 10.0000 mg | ORAL_TABLET | Freq: Every day | ORAL | Status: DC

## 2015-01-04 MED ORDER — CLOPIDOGREL BISULFATE 75 MG PO TABS: ORAL_TABLET | ORAL | Status: DC

## 2015-01-04 MED ORDER — NITROGLYCERIN 0.4 MG SL SUBL: 0.4000 mg | SUBLINGUAL_TABLET | SUBLINGUAL | Status: DC | PRN

## 2015-01-04 MED ORDER — NITROGLYCERIN 0.2 MG/HR TD PT24: 0.2000 mg | MEDICATED_PATCH | Freq: Every day | TRANSDERMAL | Status: DC

## 2015-01-04 NOTE — Patient Instructions (Signed)
Medication Instructions:  Please start Nitro patch 0.2mg  once daily We have sent refills in for your Plavix, Bystolic, Nitro, and Crestor  Labwork: None  Testing/Procedures: None  Follow-Up: 3 months    Nitroglycerin skin patches What is this medicine? NITROGLYCERIN (nye troe GLI ser in) is a type of vasodilator. It relaxes blood vessels, increasing the blood and oxygen supply to your heart. This medicine is used to prevent chest pain caused by angina. It will not help to stop an episode of chest pain. This medicine may be used for other purposes; ask your health care provider or pharmacist if you have questions. COMMON BRAND NAME(S): Deponit, Minitran, Nitrek, Nitro-Dur, Nitrodisc, Transdermal-NTG What should I tell my health care provider before I take this medicine? They need to know if you have any of these conditions: -liver disease -low blood pressure, or low blood volume -previous heart attack or heart failure -an unusual or allergic reaction to nitroglycerin, adhesives, other medicines, foods, dyes, or preservatives -pregnant or trying to get pregnant -breast-feeding How should I use this medicine? This medicine is for external use only. Follow the directions on the prescription label. One patch contains a full day's supply of medicine. It is usually worn for 12 to 14 hours a day and removed for 10 to 12 hours. Apply the patch to an area on the upper body that is clean, dry and hairless. Avoid injured, irritated, calloused, or scarred areas. Use a different site each day to prevent skin irritation. Do not cut or trim the patch. Do not use your medicine more often than directed. Do not stop using this medicine suddenly or your symptoms may get worse. Ask your doctor or health care professional how to gradually reduce the dose. Talk to your pediatrician regarding the use of this medicine in children. Special care may be needed. Overdosage: If you think you have taken too much of  this medicine contact a poison control center or emergency room at once. NOTE: This medicine is only for you. Do not share this medicine with others. What if I miss a dose? If you miss a dose, apply the patch as soon as you can. Do not wear two patches at the same time unless told to by your doctor or health care professional. What may interact with this medicine? Do not take this medicine with any of the following medications: -certain migraine medicines like ergotamine and dihydroergotamine (DHE) -medicines used to treat erectile dysfunction like sildenafil, tadalafil, and vardenafil -riociguat This medicine may also interact with the following medications: -medicines for high blood pressure -other medicines used to treat angina This list may not describe all possible interactions. Give your health care provider a list of all the medicines, herbs, non-prescription drugs, or dietary supplements you use. Also tell them if you smoke, drink alcohol, or use illegal drugs. Some items may interact with your medicine. What should I watch for while using this medicine? Check your heart rate and blood pressure regularly while you are using this medicine. Ask your doctor or health care professional what your heart rate and blood pressure should be and when you should contact him or her. Tell your doctor or health care professional if you feel your medicine is no longer having any effect. You may get drowsy or dizzy. Do not drive, use machinery, or do anything that needs mental alertness until you know how this drug affects you. Do not stand or sit up quickly, especially if you are an older patient. This reduces  the risk of dizzy or fainting spells. Alcohol can make you more drowsy and dizzy. Avoid alcoholic drinks. If you are going to have a MRI procedure, let your MRI technician know about the use of these patches. Some drug patches contain an aluminum backing that can become heated when exposed to MRI and  may cause burns. You may need to temporarily remove the patch during the MRI procedure. If the patch pulls loose or falls off, fold it in half (sticky side in) and throw away out of the reach of children or pets. Replace with a fresh patch. What side effects may I notice from receiving this medicine? Side effects that you should report to your doctor or health care professional as soon as possible: -blurred vision -dry mouth -skin rash, or irritation from the skin patch -sweating -the feeling of extreme pressure in the head -unusually weak or tired Side effects that usually do not require medical attention (report to your doctor or health care professional if they continue or are bothersome): -flushing of the face or neck -headache -irregular heartbeat, palpitations -nausea, vomiting This list may not describe all possible side effects. Call your doctor for medical advice about side effects. You may report side effects to FDA at 1-800-FDA-1088. Where should I keep my medicine? Keep out of the reach of children. Store at room temperature between 15 and 25 degrees C (59 and 77 degrees F). Avoid extremes in temperature and humidity. Throw away any unused medicine after the expiration date. NOTE: This sheet is a summary. It may not cover all possible information. If you have questions about this medicine, talk to your doctor, pharmacist, or health care provider.  2015, Elsevier/Gold Standard. (2013-04-03 16:47:37)

## 2015-01-04 NOTE — Telephone Encounter (Signed)
[facility] called stating that a prior authorization is needed for the medication Naftifine 1% cream.  The PA phone # is [phone] and pt ID # is [id number] Barbara Bowen

## 2015-01-04 NOTE — Progress Notes (Signed)
Patient Name: Barbara Bowen Date of Encounter: 01/04/2015  Primary Care Provider:  Loistine Chance, MD Primary Cardiologist:  Johnny Bridge, MD   Chief Complaint  59 y/o female with a h/o CAD s/p prior RCA/LAD stenting, who presents for f/u after recent catheterization.  Past Medical History   Past Medical History  Diagnosis Date  . Coronary artery disease     a. 09/2007 s/p PCI/DES to prox RCA;  b. 2012 Cath: nonobs dzs;  c. 01/2014 PCI: 95d (3.0x23 Xience [place]), RPDA 80 (PTCA), EF 60%;  d. 11/2014 Cath: LM anomalous, LAD/D1/D2 min irregs, LCX small, min irregs, OM2 small, RCA 20p ISR, 10d ISR, EF 65%.  . Essential hypertension   . Hyperlipidemia   . Paroxysmal atrial fibrillation     a. not appreciated on holter 09/2013;  b. CHA2DS2VASc = 5 (htn, DM, h/o TIA, vasc dzs, female) - not on [place].  Marland Kitchen Hx-TIA (transient ischemic attack)   . Anxiety   . COPD (chronic obstructive pulmonary disease)   . OA (osteoarthritis)   . Tobacco use disorder     a. does not believe cigarettes contribute to her dyspnea/asthma.    . Enthesopathy of hip region   . Plantar fascial fibromatosis   . Proteinuria   . DM type 2 (diabetes mellitus, type 2)   . Unspecified tinnitus   . Insomnia, unspecified   . Unspecified sleep apnea     a. Wears O2 via [place] @ HS.  . Lipoma of other skin and subcutaneous tissue   . Leukocytosis, unspecified   . Palpitations   . Occlusion and stenosis of carotid artery without mention of cerebral infarction   . Stroke   . CHF (congestive heart failure)   . Headache    Past Surgical History  Procedure Laterality Date  . Abdominal hysterectomy    . Appendectomy    . Exploratory thoracotomy    . Mole removal    . Breast surgery Right 06-09-2012    biopsy x2 9 oclock and 11 oclock  . Cardiac catheterization  2009    s/p right coronary artery drug-eluting stent  . Coronary angioplasty  2015  . Cardiac catheterization N/A 12/16/2014    Procedure: Left Heart Cath and  Coronary Angiography;  Surgeon: Minna Merritts, MD;  Location: [facility];  Service: Cardiovascular;  Laterality: N/A;    Allergies  Allergies  Allergen Reactions  . Amoxicillin-Pot Clavulanate     Vomiting   . Cefprozil     Vomiting   . Eggs Or Egg-Derived Products     Hives   . Esomeprazole Magnesium     unknown  . Montelukast Sodium     unknown  . Omeprazole     unknown  . Penicillins     Vomiting blood  . Prednisone     Rapid heart beat & difficulty breathing  . Spiriva [Tiotropium Bromide Monohydrate]     HA    HPI  59 y/o female with a h/o CAD s/p RCA (2009) and LAD (01/2014) DES placement, HTN, HL, borderline DM, obesity, and ongoing tobacco abuse.  I last saw her on 8/23, @ which time she c/o progressive angina and dyspnea similar to her prior angina.  As a result, she underwent diagnostic cath revealing stable anatomy with patent LAD and RCA stents.  Continued Med Rx was recommended and she was placed on imdur 30 mg daily.  Unfortunately, the imdur has given her headaches and she has not been  taking it.  She continues to smoke.  She also continues to have intermittent rest and exertional c/p and DOE but says that it has improved some.  Home Medications  Prior to Admission medications   Medication Sig Start Date End Date Taking? Authorizing Provider  albuterol (PROAIR HFA) 108 (90 BASE) MCG/ACT inhaler Inhale 2 puffs into the lungs every 6 (six) hours as needed.     Yes Historical Provider, MD  ALPRAZolam Duanne Moron) 0.5 MG tablet Take 1 tablet (0.5 mg total) by mouth 2 (two) times daily as needed. 12/29/14  Yes Steele Sizer, MD  aspirin 81 MG EC tablet Take 243 mg by mouth daily.    Yes Historical Provider, MD  clopidogrel (PLAVIX) 75 MG tablet TAKE 1 TABLET (75 MG TOTAL) BY MOUTH DAILY. 01/04/15  Yes Minna Merritts, MD  HYDROcodone-acetaminophen (NORCO) 10-325 MG per tablet Take 1 tablet by mouth every 6 (six) hours as needed. Take 1 tablet daily as needed  for headaches 12/29/14  Yes Steele Sizer, MD  naftifine (NAFTIN) 1 % cream Apply topically daily. 12/29/14  Yes Steele Sizer, MD  nebivolol (BYSTOLIC) 10 MG tablet Take 1 tablet (10 mg total) by mouth daily. 01/04/15  Yes Minna Merritts, MD  nitroGLYCERIN (NITROSTAT) 0.4 MG SL tablet Place 1 tablet (0.4 mg total) under the tongue every 5 (five) minutes as needed. 01/04/15  Yes Minna Merritts, MD  rosuvastatin (CRESTOR) 40 MG tablet Take 1 tablet (40 mg total) by mouth daily. 01/04/15  Yes Minna Merritts, MD  nitroGLYCERIN (NITRO-DUR) 0.2 mg/hr patch Place 1 patch (0.2 mg total) onto the skin daily. 01/04/15   Minna Merritts, MD    Review of Systems  C/p and dyspnea as outlined above .  All other systems reviewed and are otherwise negative except as noted above.  Physical Exam  VS:  BP 102/62 mmHg  Pulse 77  Ht 5\' 1"  (1.549 m)  Wt 224 lb (101.606 kg)  BMI 42.35 kg/m2  SpO2 96% , BMI Body mass index is 42.35 kg/(m^2). GEN: Well nourished, well developed, in no acute distress.Strong smell of cigarette smoke. HEENT: normal. Neck: Supple, no JVD, carotid bruits, or masses. Cardiac: RRR, no murmurs, rubs, or gallops. No clubbing, cyanosis, edema.  Radials/DP/PT 2+ and equal bilaterally.  Respiratory:  Respirations regular and unlabored, diminished breath sounds bilat. GI: Soft, nontender, nondistended, BS + x 4. MS: no deformity or atrophy. Skin: warm and dry, no rash. Neuro:  Strength and sensation are intact. Psych: Normal affect.  Accessory Clinical Findings  None  Assessment & Plan  1.  Stable Angina/CAD:  S/p recent cath revealing patent RCA/LAD stents.  She continues to have rest and exertional c/p - ? Microvascular angina.  She did not tolerate imdur but says that she has tolerated ntg paste and patch in the past.  I have provided her w/ a Rx for ntg patch 0.2 mg daily.  Her BP is soft and I have advised that she be on the lookout for symptoms of hypotension.  2.   Essential HTN:  BP soft today.  Cont current regimen.  3.  HL:  Cont statin.  4.  Tob Abuse:  She is still not willing to discuss her tobacco abuse.  5.  COPD:  Diminished breath sounds bilat w/o active wheezing.  Needs to quit smoking.  6.  Morbid Obesity:  Would benefit from cardiopulmonary rehab but isn't interested.  7.  L Leg Pain:  Resolved.  LE U/S neg  for DVT.  8.  Disposition:  F/u Dr. Rockey Situ in 3 mos or sooner if necessary.    Murray Hodgkins, NP 01/04/2015, 3:22 PM

## 2015-01-05 ENCOUNTER — Telehealth: Payer: Self-pay

## 2015-01-05 NOTE — Telephone Encounter (Signed)
I have received it from the pharmacy, will work on it. Thanks

## 2015-01-05 NOTE — Telephone Encounter (Signed)
Spoke with Patience with [employer] for a prior authorization for [employer]. The prior authorization is being processed and should receive in 24 to 72 hours.

## 2015-01-06 ENCOUNTER — Other Ambulatory Visit: Payer: Self-pay | Admitting: Family Medicine

## 2015-01-06 MED ORDER — KETOCONAZOLE 2 % EX CREA: 1.0000 "application " | TOPICAL_CREAM | Freq: Two times a day (BID) | CUTANEOUS | Status: DC

## 2015-01-10 ENCOUNTER — Telehealth: Payer: Self-pay

## 2015-01-10 NOTE — Telephone Encounter (Signed)
Changed alreday

## 2015-01-10 NOTE — Telephone Encounter (Signed)
Naftifine is a non-preferred medication on Medicaid list: Please change to preferred list: Ciclopirox cream, Clotrimazole cream/solution, Ketoconazole cream/shampoo, Nyamyc Powder, Nystatin Cream/Ointment/Powder, or Nystop Powder. Thanks

## 2015-01-26 ENCOUNTER — Other Ambulatory Visit: Payer: Self-pay | Admitting: Family Medicine

## 2015-01-26 NOTE — Telephone Encounter (Signed)
Crestor approved from 01/05/2015 to 12/31/2015; Ref # [id number].

## 2015-02-23 ENCOUNTER — Ambulatory Visit (INDEPENDENT_AMBULATORY_CARE_PROVIDER_SITE_OTHER): Payer: Medicaid Other | Admitting: Family Medicine

## 2015-02-23 ENCOUNTER — Encounter: Payer: Self-pay | Admitting: Family Medicine

## 2015-02-23 VITALS — BP 122/64 | HR 89 | Temp 98.2°F | Resp 16 | Ht 61.0 in | Wt 223.7 lb

## 2015-02-23 DIAGNOSIS — Z1211 Encounter for screening for malignant neoplasm of colon: Secondary | ICD-10-CM | POA: Diagnosis not present

## 2015-02-23 DIAGNOSIS — R946 Abnormal results of thyroid function studies: Secondary | ICD-10-CM

## 2015-02-23 DIAGNOSIS — Z0000 Encounter for general adult medical examination without abnormal findings: Secondary | ICD-10-CM

## 2015-02-23 DIAGNOSIS — N63 Unspecified lump in breast: Secondary | ICD-10-CM | POA: Diagnosis not present

## 2015-02-23 DIAGNOSIS — Z01419 Encounter for gynecological examination (general) (routine) without abnormal findings: Secondary | ICD-10-CM

## 2015-02-23 DIAGNOSIS — L304 Erythema intertrigo: Secondary | ICD-10-CM | POA: Insufficient documentation

## 2015-02-23 DIAGNOSIS — N6310 Unspecified lump in the right breast, unspecified quadrant: Secondary | ICD-10-CM

## 2015-02-23 DIAGNOSIS — Z114 Encounter for screening for human immunodeficiency virus [HIV]: Secondary | ICD-10-CM

## 2015-02-23 DIAGNOSIS — D72829 Elevated white blood cell count, unspecified: Secondary | ICD-10-CM | POA: Diagnosis not present

## 2015-02-23 DIAGNOSIS — E538 Deficiency of other specified B group vitamins: Secondary | ICD-10-CM | POA: Diagnosis not present

## 2015-02-23 DIAGNOSIS — Z1239 Encounter for other screening for malignant neoplasm of breast: Secondary | ICD-10-CM | POA: Diagnosis not present

## 2015-02-23 DIAGNOSIS — R7989 Other specified abnormal findings of blood chemistry: Secondary | ICD-10-CM

## 2015-02-23 MED ORDER — KETOCONAZOLE 2 % EX CREA: 1.0000 "application " | TOPICAL_CREAM | Freq: Two times a day (BID) | CUTANEOUS | Status: DC

## 2015-02-23 NOTE — Addendum Note (Signed)
Addended by: Inda Coke on: 02/23/2015 12:02 PM   Modules accepted: Orders

## 2015-02-23 NOTE — Progress Notes (Signed)
Name: Barbara Bowen   MRN: [id number]    DOB: March 29, 1956   Date:02/23/2015       Progress Note  Subjective  Chief Complaint  Chief Complaint  Patient presents with  . Annual Exam    HPI  Well Woman: s/p hysterectomy, patient states cervix also removed and does not want me to exam her pelvis. Not sexually active, husband has been very ill. No bladder symptoms.   Elevated TSH: she also has fatigue, no weight gain - but unable to lose, no constipation, she has severe dry skin. Discussed that we will recheck levels and if still elevated we will start low dose Synthroid and monitor  Elevated WBC: chronic for many years, seen by hematologist in the past, and just needs to have it monitored now. No easy bruising.   Patient Active Problem List   Diagnosis Date Noted  . Coronary artery disease   . Insomnia 12/29/2014  . Migraine without aura and without status migrainosus, not intractable 12/29/2014  . Leukocytosis 12/24/2014  . Macrocytic 12/24/2014  . Unstable angina ([place])   . Essential hypertension   . Paroxysmal atrial fibrillation ([facility])   . Diabetes mellitus with renal manifestation ([place])   . History of MI (myocardial infarction) 10/30/2013  . Benign neoplasm of breast 09/12/2012  . Nocturnal hypoxemia due to obesity 09/09/2012  . COPD (chronic obstructive pulmonary disease) ([place]) 09/09/2012  . Obstructive sleep apnea 11/02/2011  . Smoking 03/27/2011  . Hyperlipidemia 10/28/2009  . CAD (coronary artery disease), native coronary artery 10/28/2009  . PALPITATIONS 10/28/2009  . Shortness of breath 10/28/2009  . Angina pectoris ([place]) 10/28/2009    Past Surgical History  Procedure Laterality Date  . Abdominal hysterectomy    . Appendectomy    . Exploratory thoracotomy    . Mole removal    . Breast surgery Right 06-09-2012    biopsy x2 9 oclock and 11 oclock  . Cardiac catheterization  2009    s/p right coronary artery drug-eluting stent  . Coronary angioplasty  2015  .  Cardiac catheterization N/A 12/16/2014    Procedure: Left Heart Cath and Coronary Angiography;  Surgeon: Minna Merritts, MD;  Location: [facility];  Service: Cardiovascular;  Laterality: N/A;    Family History  Problem Relation Age of Onset  . Heart disease Father     CAD  . Breast cancer Maternal Aunt   . Coronary artery disease Mother   . Diabetes type II Mother     Social History   Social History  . Marital Status: Married    Spouse Name: N/A  . Number of Children: N/A  . Years of Education: N/A   Occupational History  . unemployed    Social History Main Topics  . Smoking status: Current Every Day Smoker -- 2.00 packs/day for 37 years    Types: Cigarettes    Start date: 02/01/1978  . Smokeless tobacco: Never Used  . Alcohol Use: No  . Drug Use: No  . Sexual Activity: Not Currently   Other Topics Concern  . Not on file   Social History Narrative   Lives in [place] with husband.  Does not work.  Does not routinely exercise.     Current outpatient prescriptions:  .  albuterol (PROAIR HFA) 108 (90 BASE) MCG/ACT inhaler, Inhale 2 puffs into the lungs every 6 (six) hours as needed.  , Disp: , Rfl:  .  ALPRAZolam (XANAX) 0.5 MG tablet, Take 1 tablet (0.5 mg total) by  mouth 2 (two) times daily as needed., Disp: 60 tablet, Rfl: 2 .  aspirin 81 MG EC tablet, Take 243 mg by mouth daily. , Disp: , Rfl:  .  clopidogrel (PLAVIX) 75 MG tablet, TAKE 1 TABLET (75 MG TOTAL) BY MOUTH DAILY., Disp: 90 tablet, Rfl: 3 .  HYDROcodone-acetaminophen (NORCO) 10-325 MG per tablet, Take 1 tablet by mouth every 6 (six) hours as needed. Take 1 tablet daily as needed for headaches, Disp: 30 tablet, Rfl: 0 .  ketoconazole (NIZORAL) 2 % cream, Apply 1 application topically 2 (two) times daily., Disp: 30 g, Rfl: 2 .  nebivolol (BYSTOLIC) 10 MG tablet, Take 1 tablet (10 mg total) by mouth daily., Disp: 90 tablet, Rfl: 3 .  nitroGLYCERIN (NITRO-DUR) 0.2 mg/hr patch, Place 1 patch (0.2 mg  total) onto the skin daily., Disp: 30 patch, Rfl: 6 .  nitroGLYCERIN (NITROSTAT) 0.4 MG SL tablet, Place 1 tablet (0.4 mg total) under the tongue every 5 (five) minutes as needed., Disp: 25 tablet, Rfl: 6 .  rosuvastatin (CRESTOR) 40 MG tablet, Take 1 tablet (40 mg total) by mouth daily., Disp: 90 tablet, Rfl: 3  Allergies  Allergen Reactions  . Amoxicillin-Pot Clavulanate     Vomiting   . Cefprozil     Vomiting   . Eggs Or Egg-Derived Products     Hives   . Esomeprazole Magnesium     unknown  . Montelukast Sodium     unknown  . Omeprazole     unknown  . Penicillins     Vomiting blood  . Prednisone     Rapid heart beat & difficulty breathing  . Spiriva [Tiotropium Bromide Monohydrate]     HA     ROS  Constitutional: Negative for fever or weight change.  Respiratory: Productive chronic  cough and shortness of breath.   Cardiovascular: Negative for chest pain or palpitations.  Gastrointestinal: Negative for abdominal pain, no bowel changes.  Musculoskeletal: Negative for gait problem or joint swelling.  Skin: Negative for rash.  Neurological: Negative for dizziness and mild headache past few days.  No other specific complaints in a complete review of systems (except as listed in HPI above).  Objective  Filed Vitals:   02/23/15 1123  BP: 122/64  Pulse: 89  Temp: 98.2 F (36.8 C)  TempSrc: Oral  Resp: 16  Height: 5\' 1"  (1.549 m)  Weight: 223 lb 11.2 oz (101.47 kg)  SpO2: 94%    Body mass index is 42.29 kg/(m^2).  Physical Exam  Constitutional: Patient appears well-developed and well-nourished. No distress.  HENT: Head: Normocephalic and atraumatic. Ears: B TMs ok, no erythema or effusion; Nose: Nose normal. Mouth/Throat: Oropharynx is clear and moist. No oropharyngeal exudate.  Eyes: Conjunctivae and EOM are normal. Pupils are equal, round, and reactive to light. No scleral icterus.  Neck: Normal range of motion. Neck supple. No JVD present. No thyromegaly  present.  Cardiovascular: Normal rate, regular rhythm and normal heart sounds.  No murmur heard. No BLE edema. Pulmonary/Chest: Effort normal and breath sounds normal. No respiratory distress. Abdominal: Soft. Bowel sounds are normal, no distension. There is no tenderness. no masses Breast: no lumps or masses, no nipple discharge or rashes FEMALE GENITALIA:  Not examed RECTAL: not examed Musculoskeletal: Normal range of motion, no joint effusions. No gross deformities Neurological: he is alert and oriented to person, place, and time. No cranial nerve deficit. Coordination, balance, strength, speech and gait are normal.  Skin: Skin is warm and very dry. Intertrigo  under both breasts.  Psychiatric: Patient has a normal mood and affect. behavior is normal. Judgment and thought content normal.  Recent Results (from the past 2160 hour(s))  Basic Metabolic Panel (BMET)     Status: Abnormal   Collection Time: 12/14/14  4:30 PM  Result Value Ref Range   Glucose 139 (H) 65 - 99 mg/dL   BUN 8 6 - 24 mg/dL   Creatinine, Ser 0.75 0.57 - 1.00 mg/dL   GFR calc non Af Amer 89 >59 mL/min/1.73   GFR calc Af Amer 103 >59 mL/min/1.73   BUN/Creatinine Ratio 11 9 - 23   Sodium 140 134 - 144 mmol/L   Potassium 4.7 3.5 - 5.2 mmol/L   Chloride 99 97 - 108 mmol/L   CO2 23 18 - 29 mmol/L   Calcium 9.3 8.7 - 10.2 mg/dL  INR/PT     Status: None   Collection Time: 12/14/14  4:30 PM  Result Value Ref Range   INR 0.9 0.8 - 1.2    Comment: Reference interval is for non-anticoagulated patients. Suggested INR therapeutic range for Vitamin K antagonist therapy:    Standard Dose (moderate intensity                   therapeutic range):       2.0 - 3.0    Higher intensity therapeutic range       2.5 - 3.5    Prothrombin Time 9.8 9.1 - 12.0 sec  CBC     Status: Abnormal   Collection Time: 12/14/14  4:30 PM  Result Value Ref Range   WBC 12.7 (H) 3.4 - 10.8 x10E3/uL   RBC 4.64 3.77 - 5.28 x10E6/uL   Hemoglobin  15.4 11.1 - 15.9 g/dL   Hematocrit 45.3 34.0 - 46.6 %   MCV 98 (H) 79 - 97 fL   MCH 33.2 (H) 26.6 - 33.0 pg   MCHC 34.0 31.5 - 35.7 g/dL   RDW 13.3 12.3 - 15.4 %   Platelets 235 150 - 379 x10E3/uL  CBC with Differential     Status: Abnormal   Collection Time: 12/24/14 10:20 AM  Result Value Ref Range   WBC 14.1 (H) 3.6 - 11.0 K/uL   RBC 4.83 3.80 - 5.20 MIL/uL   Hemoglobin 16.0 12.0 - 16.0 g/dL   HCT 46.8 35.0 - 47.0 %   MCV 96.9 80.0 - 100.0 fL   MCH 33.1 26.0 - 34.0 pg   MCHC 34.1 32.0 - 36.0 g/dL   RDW 13.8 11.5 - 14.5 %   Platelets 264 150 - 440 K/uL   Neutrophils Relative % 62 %   Neutro Abs 8.7 (H) 1.4 - 6.5 K/uL   Lymphocytes Relative 29 %   Lymphs Abs 4.1 (H) 1.0 - 3.6 K/uL   Monocytes Relative 7 %   Monocytes Absolute 1.0 (H) 0.2 - 0.9 K/uL   Eosinophils Relative 2 %   Eosinophils Absolute 0.3 0 - 0.7 K/uL   Basophils Relative 0 %   Basophils Absolute 0.0 0 - 0.1 K/uL  Sedimentation rate     Status: None   Collection Time: 12/24/14 10:20 AM  Result Value Ref Range   Sed Rate 18 0 - 30 mm/hr  Folate     Status: None   Collection Time: 12/24/14 10:20 AM  Result Value Ref Range   Folate 11.5 >5.9 ng/mL  TSH     Status: Abnormal   Collection Time: 12/24/14 10:20 AM  Result Value Ref  Range   TSH 6.889 (H) 0.350 - 4.500 uIU/mL  Vitamin B12     Status: None   Collection Time: 12/24/14 10:21 AM  Result Value Ref Range   Vitamin B-12 359 180 - 914 pg/mL    Comment: (NOTE) This assay is not validated for testing neonatal or myeloproliferative syndrome specimens for Vitamin B12 levels. Performed at [facility]   POCT HgB A1C     Status: None   Collection Time: 12/29/14 10:56 AM  Result Value Ref Range   Hemoglobin A1C 7.0   POCT UA - Microalbumin     Status: None   Collection Time: 12/29/14 10:57 AM  Result Value Ref Range   Microalbumin Ur, POC 20 mg/L   Creatinine, POC  mg/dL   Albumin/Creatinine Ratio, Urine, POC     PHQ2/9: Depression screen [facility]  2/9 02/23/2015 12/29/2014  Decreased Interest 0 0  Down, Depressed, Hopeless 0 0  PHQ - 2 Score 0 0    Fall Risk: Fall Risk  02/23/2015 12/29/2014  Falls in the past year? No No    Functional Status Survey: Is the patient deaf or have difficulty hearing?: No Does the patient have difficulty seeing, even when wearing glasses/contacts?: Yes (glasses) Does the patient have difficulty concentrating, remembering, or making decisions?: No Does the patient have difficulty walking or climbing stairs?: No Does the patient have difficulty dressing or bathing?: No Does the patient have difficulty doing errands alone such as visiting a doctor's office or shopping?: No   Assessment & Plan  1. Well woman exam  Discussed importance of 150 minutes of physical activity weekly, eat two servings of fish weekly, eat one serving of tree nuts ( cashews, pistachios, pecans, almonds.Marland Kitchen) every other day, eat 6 servings of fruit/vegetables daily and drink plenty of water and avoid sweet beverages.    2. TSH elevation  - Thyroid Panel With TSH  3. B12 deficiency  She will try otc B12 or return for B12 injections  4. Leukocytosis  Seen by hematologist in the past, explained need to stop smoking - CBC with Differential/Platelet  5. Colon cancer screening  - Ambulatory referral to Gastroenterology  6. Breast cancer screening  - MM Digital Screening; Future  7. Encounter for screening for HIV  - HIV antibody  8. Intertrigo  - ketoconazole (NIZORAL) 2 % cream; Apply 1 application topically 2 (two) times daily. Mix with hydrocortison 10 otc ( patient will buy it otc )  Dispense: 60 g; Refill: 2

## 2015-03-09 ENCOUNTER — Telehealth: Payer: Self-pay

## 2015-03-09 NOTE — Telephone Encounter (Signed)
Patient needs f/u appt for Sleep apnea

## 2015-03-09 NOTE — Telephone Encounter (Signed)
Called patient no answer and not able to leave a message: patient already has appointment scheduled with Dr Ancil Boozer in December.

## 2015-03-12 LAB — CBC WITH DIFFERENTIAL/PLATELET
BASOS: 0 %
Basophils Absolute: 0.1 10*3/uL (ref 0.0–0.2)
EOS (ABSOLUTE): 0.3 10*3/uL (ref 0.0–0.4)
Eos: 2 %
HEMOGLOBIN: 15.6 g/dL (ref 11.1–15.9)
Hematocrit: 46 % (ref 34.0–46.6)
IMMATURE GRANS (ABS): 0 10*3/uL (ref 0.0–0.1)
Immature Granulocytes: 0 %
LYMPHS ABS: 4.3 10*3/uL — AB (ref 0.7–3.1)
LYMPHS: 29 %
MCH: 33.5 pg — AB (ref 26.6–33.0)
MCHC: 33.9 g/dL (ref 31.5–35.7)
MCV: 99 fL — AB (ref 79–97)
MONOCYTES: 9 %
Monocytes Absolute: 1.3 10*3/uL — ABNORMAL HIGH (ref 0.1–0.9)
NEUTROS ABS: 8.9 10*3/uL — AB (ref 1.4–7.0)
Neutrophils: 60 %
Platelets: 223 10*3/uL (ref 150–379)
RBC: 4.65 x10E6/uL (ref 3.77–5.28)
RDW: 13.7 % (ref 12.3–15.4)
WBC: 14.9 10*3/uL — ABNORMAL HIGH (ref 3.4–10.8)

## 2015-03-12 LAB — THYROID PANEL WITH TSH
FREE THYROXINE INDEX: 2.3 (ref 1.2–4.9)
T3 Uptake Ratio: 25 % (ref 24–39)
T4, Total: 9.1 ug/dL (ref 4.5–12.0)
TSH: 2.24 u[IU]/mL (ref 0.450–4.500)

## 2015-03-12 LAB — HIV ANTIBODY (ROUTINE TESTING W REFLEX): HIV SCREEN 4TH GENERATION: NONREACTIVE

## 2015-03-14 ENCOUNTER — Other Ambulatory Visit: Payer: Medicaid Other

## 2015-03-14 ENCOUNTER — Telehealth: Payer: Self-pay | Admitting: Family Medicine

## 2015-03-14 ENCOUNTER — Ambulatory Visit: Payer: Medicaid Other | Attending: Family Medicine

## 2015-03-14 NOTE — Telephone Encounter (Signed)
Melissa from [place] states Patient re certification is due 04-23-15 and therefore [employer] is needing her last office note. States that now medicaid now require the notes for the recert. Please fax to (F) [phone]  [phone]

## 2015-03-14 NOTE — Telephone Encounter (Signed)
Faxed last two notes that patient had with Dr. Ancil Boozer to [facility].

## 2015-03-23 NOTE — Telephone Encounter (Signed)
Spoke to Hardy from [facility] and she will also try to reach the patient to have her call the office for an appt.

## 2015-03-23 NOTE — Telephone Encounter (Signed)
Patient needs appointment for documentation of why she is on oxygen in her chart for ins. Purposes to [employer].  I have tried to call myself several times.

## 2015-03-31 ENCOUNTER — Encounter: Payer: Self-pay | Admitting: Family Medicine

## 2015-03-31 ENCOUNTER — Ambulatory Visit (INDEPENDENT_AMBULATORY_CARE_PROVIDER_SITE_OTHER): Payer: Medicaid Other | Admitting: Family Medicine

## 2015-03-31 VITALS — BP 130/86 | HR 88 | Temp 99.0°F | Resp 16 | Ht 61.0 in | Wt 224.6 lb

## 2015-03-31 DIAGNOSIS — I10 Essential (primary) hypertension: Secondary | ICD-10-CM

## 2015-03-31 DIAGNOSIS — E785 Hyperlipidemia, unspecified: Secondary | ICD-10-CM

## 2015-03-31 DIAGNOSIS — G4700 Insomnia, unspecified: Secondary | ICD-10-CM | POA: Diagnosis not present

## 2015-03-31 DIAGNOSIS — I480 Paroxysmal atrial fibrillation: Secondary | ICD-10-CM | POA: Diagnosis not present

## 2015-03-31 DIAGNOSIS — G43009 Migraine without aura, not intractable, without status migrainosus: Secondary | ICD-10-CM | POA: Diagnosis not present

## 2015-03-31 DIAGNOSIS — J449 Chronic obstructive pulmonary disease, unspecified: Secondary | ICD-10-CM | POA: Diagnosis not present

## 2015-03-31 DIAGNOSIS — I200 Unstable angina: Secondary | ICD-10-CM

## 2015-03-31 DIAGNOSIS — E1121 Type 2 diabetes mellitus with diabetic nephropathy: Secondary | ICD-10-CM | POA: Diagnosis not present

## 2015-03-31 DIAGNOSIS — D72829 Elevated white blood cell count, unspecified: Secondary | ICD-10-CM | POA: Diagnosis not present

## 2015-03-31 DIAGNOSIS — E538 Deficiency of other specified B group vitamins: Secondary | ICD-10-CM | POA: Diagnosis not present

## 2015-03-31 DIAGNOSIS — G4736 Sleep related hypoventilation in conditions classified elsewhere: Secondary | ICD-10-CM | POA: Diagnosis not present

## 2015-03-31 DIAGNOSIS — G4733 Obstructive sleep apnea (adult) (pediatric): Secondary | ICD-10-CM

## 2015-03-31 MED ORDER — ALBUTEROL SULFATE HFA 108 (90 BASE) MCG/ACT IN AERS: 2.0000 | INHALATION_SPRAY | Freq: Four times a day (QID) | RESPIRATORY_TRACT | Status: DC | PRN

## 2015-03-31 MED ORDER — QUETIAPINE FUMARATE 25 MG PO TABS: 25.0000 mg | ORAL_TABLET | Freq: Every day | ORAL | Status: DC

## 2015-03-31 MED ORDER — HYDROCODONE-ACETAMINOPHEN 10-325 MG PO TABS: 1.0000 | ORAL_TABLET | Freq: Four times a day (QID) | ORAL | Status: DC | PRN

## 2015-03-31 MED ORDER — ALPRAZOLAM 0.5 MG PO TABS: 0.5000 mg | ORAL_TABLET | Freq: Two times a day (BID) | ORAL | Status: DC | PRN

## 2015-03-31 NOTE — Progress Notes (Signed)
Name: Barbara Bowen   MRN: [id number]    DOB: March 04, 1956   Date:03/31/2015       Progress Note  Subjective  Chief Complaint  Chief Complaint  Patient presents with  . Medication Refill    follow-up and   . Hypertension    headaches  . Hyperlipidemia  . Migraine    worst with stress and takes pain medication to help relieve her symptoms  . Anxiety    unchanged  . COPD    Patient O2 while walking dropped to 89 and became lightheaded and dizzy, while sitting her O2 was 94. Patient has SOB that is unchanged.    HPI    DMII: with vascular disease and diabetic nephropathy. She is currently on diet only, hgbA1C 7.0, she is terrified of trying new medications, she is eating healthier, drinking water., unable to exercise because of COPD/CAD.  She denies polyphagia, polyuria or polydipsia . Explained importance of starting ACE/ARB but she refuses it.   CAD: recent had a cath done by Dr. Rockey Situ, stents still open,  she is on medication managements at this time. She describes pain on chest as a burning sensation, has stable and is cardiac related, did not respond reflux medication in the past.   HTN: taking betablocker, advised to add ACE/ARB but she refuses starting a new medication  Migraine headaches: episodes about 5 per week now, she states symptoms gets worse when colder outside,  seen by neurologist but per patient can't tolerate any prophylactic medication, the only thing that works for her is hydrodocone prn, 30 pills to last 90 days.   Insomnia: she denies depression or anxiety, takes alprazolam for sleep, it does not last all night. Advised to try something else, such as seroquel , Ambient and Trazodone,she agreed on trying seroquel.   COPD: allergic to Spiriva, uses nocturnal oxygen, still smoking, has daily symptoms. She refuses taking any medication. Symbicort and Advair causes palpitation and does not want to try anything else. She is still smoking and refuses to quit  OSA:  had a CPAP machine at home, but had problems getting tubes and oxygen through the CPAP and she was never able to use it.    Patient Active Problem List   Diagnosis Date Noted  . B12 deficiency 03/31/2015  . OSA (obstructive sleep apnea) 03/31/2015  . 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 Intertrigo 02/23/2015  . Coronary artery disease   . Insomnia 12/29/2014  . Migraine without aura and without status migrainosus, not intractable 12/29/2014  . Leukocytosis 12/24/2014  . Macrocytic 12/24/2014  . Unstable angina ([place])   . Essential hypertension   . Paroxysmal atrial fibrillation (HCC)   . Diabetes mellitus with renal manifestation ([place])   . History of MI (myocardial infarction) 10/30/2013  . Benign neoplasm of breast 09/12/2012  . Nocturnal hypoxemia due to obesity 09/09/2012  . COPD (chronic obstructive pulmonary disease) ([place]) 09/09/2012  . Smoking 03/27/2011  . Hyperlipidemia 10/28/2009  . CAD (coronary artery disease), native coronary artery 10/28/2009  . PALPITATIONS 10/28/2009  . Shortness of breath 10/28/2009  . Angina pectoris ([place]) 10/28/2009    Past Surgical History  Procedure Laterality Date  . Abdominal hysterectomy    . Appendectomy    . Exploratory thoracotomy    . Mole removal    . Breast surgery Right 06-09-2012    biopsy x2 9 oclock and 11 oclock  . Cardiac catheterization  2009    s/p right coronary  artery drug-eluting stent  . Coronary angioplasty  2015  . Cardiac catheterization N/A 12/16/2014    Procedure: Left Heart Cath and Coronary Angiography;  Surgeon: Minna Merritts, MD;  Location: [facility];  Service: Cardiovascular;  Laterality: N/A;    Family History  Problem Relation Age of Onset  . Heart disease Father     CAD  . Breast cancer Maternal Aunt   . Coronary artery disease Mother   . Diabetes type II Mother     Social History   Social History  . Marital Status: Married    Spouse  Name: N/A  . Number of Children: N/A  . Years of Education: N/A   Occupational History  . unemployed    Social History Main Topics  . Smoking status: Current Every Day Smoker -- 2.00 packs/day for 37 years    Types: Cigarettes    Start date: 02/01/1978  . Smokeless tobacco: Never Used  . Alcohol Use: No  . Drug Use: No  . Sexual Activity: Not Currently   Other Topics Concern  . Not on file   Social History Narrative   Lives in [place] with husband.  Does not work.  Does not routinely exercise.     Current outpatient prescriptions:  .  albuterol (PROAIR HFA) 108 (90 BASE) MCG/ACT inhaler, Inhale 2 puffs into the lungs every 6 (six) hours as needed., Disp: 1 Inhaler, Rfl: 0 .  ALPRAZolam (XANAX) 0.5 MG tablet, Take 1 tablet (0.5 mg total) by mouth 2 (two) times daily as needed., Disp: 60 tablet, Rfl: 2 .  aspirin 81 MG EC tablet, Take 243 mg by mouth daily. , Disp: , Rfl:  .  clopidogrel (PLAVIX) 75 MG tablet, TAKE 1 TABLET (75 MG TOTAL) BY MOUTH DAILY., Disp: 90 tablet, Rfl: 3 .  HYDROcodone-acetaminophen (NORCO) 10-325 MG tablet, Take 1 tablet by mouth every 6 (six) hours as needed. Take 1 tablet daily as needed for headaches, Disp: 30 tablet, Rfl: 0 .  ketoconazole (NIZORAL) 2 % cream, Apply 1 application topically 2 (two) times daily. Mix with hydrocortison 10 otc ( patient will buy it otc ), Disp: 60 g, Rfl: 2 .  nebivolol (BYSTOLIC) 10 MG tablet, Take 1 tablet (10 mg total) by mouth daily., Disp: 90 tablet, Rfl: 3 .  nitroGLYCERIN (NITRO-DUR) 0.2 mg/hr patch, Place 1 patch (0.2 mg total) onto the skin daily., Disp: 30 patch, Rfl: 6 .  nitroGLYCERIN (NITROSTAT) 0.4 MG SL tablet, Place 1 tablet (0.4 mg total) under the tongue every 5 (five) minutes as needed., Disp: 25 tablet, Rfl: 6 .  QUEtiapine (SEROQUEL) 25 MG tablet, Take 1 tablet (25 mg total) by mouth at bedtime., Disp: 30 tablet, Rfl: 2 .  rosuvastatin (CRESTOR) 40 MG tablet, Take 1 tablet (40 mg total) by mouth daily.,  Disp: 90 tablet, Rfl: 3  Allergies  Allergen Reactions  . Amoxicillin-Pot Clavulanate     Vomiting   . Cefprozil     Vomiting   . Eggs Or Egg-Derived Products     Hives   . Esomeprazole Magnesium     unknown  . Montelukast Sodium     unknown  . Omeprazole     unknown  . Penicillins     Vomiting blood  . Prednisone     Rapid heart beat & difficulty breathing  . Spiriva [Tiotropium Bromide Monohydrate]     HA     ROS  Constitutional: Negative for fever or weight change.  Respiratory: Positive  for cough and shortness of breath.   Cardiovascular: Positive  for chest pain or palpitations.  Gastrointestinal: Negative for abdominal pain, no bowel changes.  Musculoskeletal: Negative for gait problem or joint swelling.  Skin: Negative for rash.  Neurological: Negative for dizziness, positive for  headache.  No other specific complaints in a complete review of systems (except as listed in HPI above).  Objective  Filed Vitals:   03/31/15 0918 03/31/15 0946  BP: 130/86   Pulse: 82 88  Temp: 99 F (37.2 C)   TempSrc: Oral   Resp: 16   Height: 5\' 1"  (1.549 m)   Weight: 224 lb 9.6 oz (101.878 kg)   SpO2: 92% 89%    Body mass index is 42.46 kg/(m^2).  Physical Exam  Constitutional: Patient appears well-developed and well-nourished. Obese No distress.  HEENT: head atraumatic, normocephalic, pupils equal and reactive to light,  neck supple, throat within normal limits Cardiovascular: Normal rate, regular rhythm and normal heart sounds.  No murmur heard. No BLE edema. Pulmonary/Chest: Effort normal and breath sounds normal. No respiratory distress. Abdominal: Soft.  There is no tenderness. Psychiatric: Patient has a normal mood and affect. behavior is normal. Judgment and thought content normal.  Recent Results (from the past 2160 hour(s))  CBC with Differential/Platelet     Status: Abnormal   Collection Time: 03/11/15  9:03 AM  Result Value Ref Range   WBC 14.9 (H)  3.4 - 10.8 x10E3/uL   RBC 4.65 3.77 - 5.28 x10E6/uL   Hemoglobin 15.6 11.1 - 15.9 g/dL   Hematocrit 46.0 34.0 - 46.6 %   MCV 99 (H) 79 - 97 fL   MCH 33.5 (H) 26.6 - 33.0 pg   MCHC 33.9 31.5 - 35.7 g/dL   RDW 13.7 12.3 - 15.4 %   Platelets 223 150 - 379 x10E3/uL   Neutrophils 60 %   Lymphs 29 %   Monocytes 9 %   Eos 2 %   Basos 0 %   Neutrophils Absolute 8.9 (H) 1.4 - 7.0 x10E3/uL   Lymphocytes Absolute 4.3 (H) 0.7 - 3.1 x10E3/uL   Monocytes Absolute 1.3 (H) 0.1 - 0.9 x10E3/uL   EOS (ABSOLUTE) 0.3 0.0 - 0.4 x10E3/uL   Basophils Absolute 0.1 0.0 - 0.2 x10E3/uL   Immature Granulocytes 0 %   Immature Grans (Abs) 0.0 0.0 - 0.1 x10E3/uL  Thyroid Panel With TSH     Status: None   Collection Time: 03/11/15  9:03 AM  Result Value Ref Range   TSH 2.240 0.450 - 4.500 uIU/mL   T4, Total 9.1 4.5 - 12.0 ug/dL   T3 Uptake Ratio 25 24 - 39 %   Free Thyroxine Index 2.3 1.2 - 4.9  HIV antibody     Status: None   Collection Time: 03/11/15  9:03 AM  Result Value Ref Range   HIV Screen 4th Generation wRfx Non Reactive Non Reactive     PHQ2/9: Depression screen [facility] 2/9 02/23/2015 12/29/2014  Decreased Interest 0 0  Down, Depressed, Hopeless 0 0  PHQ - 2 Score 0 0     Fall Risk: Fall Risk  02/23/2015 12/29/2014  Falls in the past year? No No     Diabetic Foot Exam - Simple   Simple Foot Form  Diabetic Foot exam was performed with the following findings:  Yes 03/31/2015 10:25 AM  Visual Inspection  See comments:  Yes  Sensation Testing  Intact to touch and monofilament testing bilaterally:  Yes  Pulse Check  Posterior  Tibialis and Dorsalis pulse intact bilaterally:  Yes  Comments  Thick toe nails, callus formation     Assessment & Plan  1. Type 2 diabetes mellitus with diabetic nephropathy, without long-term current use of insulin (HCC)  Filled out form for diabetic shoe Discussed importance of medication if hgbA1C remains above 7, but she refuses. Also advised to take aRB for  kidney protection  2. B12 deficiency  -B12 injection today  3. Essential hypertension  At goal, continue medication   4. Paroxysmal atrial fibrillation (HCC)  Sees Dr. Rockey Situ, on Plavix and Bystolic  5. Unstable angina (HCC)  Continue Beta blocker, and Imdur  6. Migraine without aura and without status migrainosus, not intractable  She refuses prophylatic medication - HYDROcodone-acetaminophen (NORCO) 10-325 MG tablet; Take 1 tablet by mouth every 6 (six) hours as needed. Take 1 tablet daily as needed for headaches  Dispense: 30 tablet; Refill: 0  7. Chronic obstructive pulmonary disease, unspecified COPD type (HCC)  - albuterol (PROAIR HFA) 108 (90 BASE) MCG/ACT inhaler; Inhale 2 puffs into the lungs every 6 (six) hours as needed.  Dispense: 1 Inhaler; Refill: 0  8. Hyperlipidemia  Continue statin therapy   9. OSA (obstructive sleep apnea)  We will try getting supplies through [facility]  10. 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 Insomnia   we will add Seroquel  - ALPRAZolam (XANAX) 0.5 MG tablet; Take 1 tablet (0.5 mg total) by mouth 2 (two) times daily as needed.  Dispense: 60 tablet; Refill: 2 - QUEtiapine (SEROQUEL) 25 MG tablet; Take 1 tablet (25 mg total) by mouth at bedtime.  Dispense: 30 tablet; Refill: 2   12. Leukocytosis  Refuses to have repeat CBC today

## 2015-04-05 ENCOUNTER — Telehealth: Payer: Self-pay | Admitting: *Deleted

## 2015-04-05 ENCOUNTER — Other Ambulatory Visit: Payer: Self-pay

## 2015-04-05 ENCOUNTER — Ambulatory Visit: Payer: Medicaid Other | Admitting: Cardiovascular Disease

## 2015-04-05 MED ORDER — CLOPIDOGREL BISULFATE 75 MG PO TABS: ORAL_TABLET | ORAL | Status: DC

## 2015-04-05 MED ORDER — NEBIVOLOL HCL 10 MG PO TABS
10.0000 mg | ORAL_TABLET | Freq: Every day | ORAL | Status: DC
Start: 2015-04-05 — End: 2015-11-23

## 2015-04-05 MED ORDER — NEBIVOLOL HCL 10 MG PO TABS: 10.0000 mg | ORAL_TABLET | Freq: Every day | ORAL | Status: DC

## 2015-04-05 NOTE — Telephone Encounter (Signed)
°*  STAT* If patient is at the pharmacy, call can be transferred to refill team.   1. Which medications need to be refilled? (please list name of each medication and dose if known) Plavix and Bystolic   2. Which pharmacy/location (including street and city if local pharmacy) is medication to be sent to? [facility] [place]   3. Do they need a 30 day or 90 day supply? [place]

## 2015-04-05 NOTE — Telephone Encounter (Signed)
Refill sent for Bystolic and plavix.

## 2015-05-24 ENCOUNTER — Telehealth: Payer: Self-pay

## 2015-05-24 NOTE — Telephone Encounter (Signed)
Spoke with [facility] regarding prior approval for Bystolic 10 mg one tablet daily. The Bystolic is approved from 05/24/2015 to 05/18/2016 with [id number]. The patient has failed in the past Diltiazem, Atenolol 100 mg and Metoprolol Tart 50 mg.  Notified patient the Bystolic is approved.

## 2015-06-24 ENCOUNTER — Ambulatory Visit (INDEPENDENT_AMBULATORY_CARE_PROVIDER_SITE_OTHER): Payer: Medicaid Other | Admitting: Family Medicine

## 2015-06-24 ENCOUNTER — Encounter: Payer: Self-pay | Admitting: Family Medicine

## 2015-06-24 VITALS — BP 118/74 | HR 82 | Temp 98.4°F | Resp 20 | Wt 221.2 lb

## 2015-06-24 DIAGNOSIS — G43009 Migraine without aura, not intractable, without status migrainosus: Secondary | ICD-10-CM

## 2015-06-24 DIAGNOSIS — L304 Erythema intertrigo: Secondary | ICD-10-CM

## 2015-06-24 DIAGNOSIS — Z79899 Other long term (current) drug therapy: Secondary | ICD-10-CM

## 2015-06-24 DIAGNOSIS — E538 Deficiency of other specified B group vitamins: Secondary | ICD-10-CM

## 2015-06-24 DIAGNOSIS — E1121 Type 2 diabetes mellitus with diabetic nephropathy: Secondary | ICD-10-CM | POA: Diagnosis not present

## 2015-06-24 DIAGNOSIS — Z1211 Encounter for screening for malignant neoplasm of colon: Secondary | ICD-10-CM

## 2015-06-24 DIAGNOSIS — G4734 Idiopathic sleep related nonobstructive alveolar hypoventilation: Secondary | ICD-10-CM

## 2015-06-24 DIAGNOSIS — E785 Hyperlipidemia, unspecified: Secondary | ICD-10-CM | POA: Diagnosis not present

## 2015-06-24 DIAGNOSIS — H579 Unspecified disorder of eye and adnexa: Secondary | ICD-10-CM | POA: Diagnosis not present

## 2015-06-24 DIAGNOSIS — I200 Unstable angina: Secondary | ICD-10-CM

## 2015-06-24 DIAGNOSIS — G4733 Obstructive sleep apnea (adult) (pediatric): Secondary | ICD-10-CM | POA: Diagnosis not present

## 2015-06-24 DIAGNOSIS — G4700 Insomnia, unspecified: Secondary | ICD-10-CM

## 2015-06-24 DIAGNOSIS — I480 Paroxysmal atrial fibrillation: Secondary | ICD-10-CM

## 2015-06-24 DIAGNOSIS — D72829 Elevated white blood cell count, unspecified: Secondary | ICD-10-CM | POA: Diagnosis not present

## 2015-06-24 DIAGNOSIS — R0902 Hypoxemia: Secondary | ICD-10-CM

## 2015-06-24 DIAGNOSIS — J449 Chronic obstructive pulmonary disease, unspecified: Secondary | ICD-10-CM | POA: Diagnosis not present

## 2015-06-24 LAB — POCT GLYCOSYLATED HEMOGLOBIN (HGB A1C): Hemoglobin A1C: 7

## 2015-06-24 MED ORDER — SUVOREXANT 10 MG PO TABS: 1.0000 | ORAL_TABLET | ORAL | Status: DC

## 2015-06-24 MED ORDER — QUETIAPINE FUMARATE 25 MG PO TABS: 25.0000 mg | ORAL_TABLET | Freq: Every day | ORAL | Status: DC

## 2015-06-24 MED ORDER — OLOPATADINE HCL 0.2 % OP SOLN: 1.0000 [drp] | Freq: Every day | OPHTHALMIC | Status: DC

## 2015-06-24 MED ORDER — HYDROCODONE-ACETAMINOPHEN 10-325 MG PO TABS: 1.0000 | ORAL_TABLET | Freq: Four times a day (QID) | ORAL | Status: DC | PRN

## 2015-06-24 MED ORDER — KETOCONAZOLE 2 % EX CREA: 1.0000 "application " | TOPICAL_CREAM | Freq: Two times a day (BID) | CUTANEOUS | Status: DC

## 2015-06-24 MED ORDER — ALPRAZOLAM 0.5 MG PO TABS: 0.5000 mg | ORAL_TABLET | Freq: Two times a day (BID) | ORAL | Status: DC | PRN

## 2015-06-24 NOTE — Progress Notes (Signed)
Name: Barbara Bowen   MRN: [id number]    DOB: 1956/04/21   Date:06/24/2015       Progress Note  Subjective  Chief Complaint  Chief Complaint  Patient presents with  . Follow-up    patient is here for her 23-month f/u.  Marland Kitchen Eye Drainage    patient stated that she has been having pain and burning in both eyes since before Christmas. patient has tried some otc eye drops and it helped but sxs returned.  . Medication Refill    HPI     DMII: with vascular disease and diabetic nephropathy. She is currently on diet only, hgbA1C 7.0, she is terrified of trying new medications, she is eating healthier, drinking water., unable to exercise because of COPD/CAD. She has lost a few pounds since last visit. She denies polyphagia, polyuria or polydipsia . Explained importance of starting ACE/ARB but she refuses it.   Eye Problem: symptoms started mid December with itchy eyes, followed by watery eyes and a rash around her eyes. She states feels different from her allergies, sometimes her vision gets blurred, at times her eyes gets red  CAD: last  cath done by Dr. Rockey Situ was in 2016 , stents still open, she is on medication managements at this time. She states she has not been using the patch daily, only using prn, last episode 3 months ago. When present the pain is described as burning like, but is not secondary to reflux.  HTN: taking betablocker, advised to add ACE/ARB but she refuses starting a new medication  Migraine headaches: episodes about 2-3 per week now, she states symptoms gets worse when colder outside, doing better since temperature has increased now, seen by neurologist but per patient can't tolerate any prophylactic medication, the only thing that works for her is hydrodocone prn, 30 pills to last 90 days.   Insomnia: she denies depression or anxiety, takes alprazolam for sleep, it does not last all night, we tried her on Seroquel but she states it caused her nightmares.   COPD: allergic  to Spiriva, uses nocturnal oxygen, still smoking, has daily symptoms. She refuses taking any medication. Symbicort and Advair causes palpitation and does not want to try anything else. She is still smoking and refuses to quit  OSA: had a CPAP machine at home, but had problems getting oxygen with machine so they took it away, we need to have the same company supplying oxygen and CPAP supplies.     Patient Active Problem List   Diagnosis Date Noted  . B12 deficiency 03/31/2015  . OSA (obstructive sleep apnea) 03/31/2015  . 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 Intertrigo 02/23/2015  . Coronary artery disease   . Insomnia 12/29/2014  . Migraine without aura and without status migrainosus, not intractable 12/29/2014  . Leukocytosis 12/24/2014  . Macrocytic 12/24/2014  . Unstable angina ([place])   . Essential hypertension   . Paroxysmal atrial fibrillation (HCC)   . Diabetes mellitus with renal manifestation ([place])   . History of MI (myocardial infarction) 10/30/2013  . Benign neoplasm of breast 09/12/2012  . Nocturnal hypoxemia due to obesity 09/09/2012  . COPD (chronic obstructive pulmonary disease) ([place]) 09/09/2012  . Smoking 03/27/2011  . Hyperlipidemia 10/28/2009  . CAD (coronary artery disease), native coronary artery 10/28/2009  . Shortness of breath 10/28/2009  . Angina pectoris ([place]) 10/28/2009    Past Surgical History  Procedure Laterality Date  . Abdominal hysterectomy    . Appendectomy    .  Exploratory thoracotomy    . Mole removal    . Breast surgery Right 06-09-2012    biopsy x2 9 oclock and 11 oclock  . Cardiac catheterization  2009    s/p right coronary artery drug-eluting stent  . Coronary angioplasty  2015  . Cardiac catheterization N/A 12/16/2014    Procedure: Left Heart Cath and Coronary Angiography;  Surgeon: Minna Merritts, MD;  Location: [facility];  Service: Cardiovascular;  Laterality: N/A;    Family  History  Problem Relation Age of Onset  . Heart disease Father     CAD  . Breast cancer Maternal Aunt   . Coronary artery disease Mother   . Diabetes type II Mother     Social History   Social History  . Marital Status: Married    Spouse Name: N/A  . Number of Children: N/A  . Years of Education: N/A   Occupational History  . unemployed    Social History Main Topics  . Smoking status: Current Every Day Smoker -- 2.00 packs/day for 37 years    Types: Cigarettes    Start date: 02/01/1978  . Smokeless tobacco: Never Used  . Alcohol Use: No  . Drug Use: No  . Sexual Activity: Not Currently   Other Topics Concern  . Not on file   Social History Narrative   Lives in [place] with husband.  Does not work.  Does not routinely exercise.     Current outpatient prescriptions:  .  albuterol (PROAIR HFA) 108 (90 BASE) MCG/ACT inhaler, Inhale 2 puffs into the lungs every 6 (six) hours as needed., Disp: 1 Inhaler, Rfl: 0 .  ALPRAZolam (XANAX) 0.5 MG tablet, Take 1 tablet (0.5 mg total) by mouth 2 (two) times daily as needed., Disp: 60 tablet, Rfl: 2 .  aspirin 81 MG EC tablet, Take 243 mg by mouth daily. , Disp: , Rfl:  .  clopidogrel (PLAVIX) 75 MG tablet, TAKE 1 TABLET (75 MG TOTAL) BY MOUTH DAILY., Disp: 90 tablet, Rfl: 3 .  HYDROcodone-acetaminophen (NORCO) 10-325 MG tablet, Take 1 tablet by mouth every 6 (six) hours as needed. Take 1 tablet daily as needed for headaches, Disp: 30 tablet, Rfl: 0 .  ketoconazole (NIZORAL) 2 % cream, Apply 1 application topically 2 (two) times daily. Mix with hydrocortison 10 otc ( patient will buy it otc ), Disp: 60 g, Rfl: 2 .  nebivolol (BYSTOLIC) 10 MG tablet, Take 1 tablet (10 mg total) by mouth daily., Disp: 90 tablet, Rfl: 3 .  nitroGLYCERIN (NITRO-DUR) 0.2 mg/hr patch, Place 1 patch (0.2 mg total) onto the skin daily., Disp: 30 patch, Rfl: 6 .  nitroGLYCERIN (NITROSTAT) 0.4 MG SL tablet, Place 1 tablet (0.4 mg total) under the tongue every 5  (five) minutes as needed., Disp: 25 tablet, Rfl: 6 .  QUEtiapine (SEROQUEL) 25 MG tablet, Take 1 tablet (25 mg total) by mouth at bedtime., Disp: 30 tablet, Rfl: 2 .  rosuvastatin (CRESTOR) 40 MG tablet, Take 1 tablet (40 mg total) by mouth daily., Disp: 90 tablet, Rfl: 3 .  Olopatadine HCl (PATADAY) 0.2 % SOLN, Apply 1 drop to eye daily., Disp: 2.5 mL, Rfl: 0  Allergies  Allergen Reactions  . Amoxicillin-Pot Clavulanate     Vomiting   . Cefprozil     Vomiting   . Eggs Or Egg-Derived Products     Hives   . Esomeprazole Magnesium     unknown  . Montelukast Sodium     unknown  .  Omeprazole     unknown  . Penicillins     Vomiting blood  . Prednisone     Rapid heart beat & difficulty breathing  . Spiriva [Tiotropium Bromide Monohydrate]     HA     ROS  Constitutional: Negative for fever or significant weight change.  Respiratory: Positive  for cough and shortness of breath.   Cardiovascular: Negative for chest pain or palpitations.  Gastrointestinal: Negative for abdominal pain, no bowel changes.  Musculoskeletal: Positive  for gait problem secondary to knee and hip pain, intermittent knee effusion  Skin: Intertrigo  Neurological: Positive  for intermittent dizziness and frequent  headaches  No other specific complaints in a complete review of systems (except as listed in HPI above).  Objective  Filed Vitals:   06/24/15 0959  BP: 118/74  Pulse: 82  Temp: 98.4 F (36.9 C)  TempSrc: Oral  Resp: 20  Weight: 221 lb 3.2 oz (100.336 kg)  SpO2: 93%    Body mass index is 41.82 kg/(m^2).  Physical Exam  Constitutional: Patient appears well-developed and well-nourished. Obese  No distress.  HEENT: head atraumatic, normocephalic, pupils equal and reactive to light, watery eyes, conjunctiva is intact normal pupils, some eczema around eyes  neck supple, throat within normal limits Cardiovascular: Normal rate, regular rhythm and normal heart sounds.  No murmur heard. No BLE  edema. Pulmonary/Chest: Effort normal and breath sounds normal. No respiratory distress. Abdominal: Soft.  There is no tenderness. Psychiatric: Patient has a normal mood and affect. behavior is normal. Judgment and thought content normal.   PHQ2/9: Depression screen [facility] 2/9 06/24/2015 02/23/2015 12/29/2014  Decreased Interest 0 0 0  Down, Depressed, Hopeless 1 0 0  PHQ - 2 Score 1 0 0     Fall Risk: Fall Risk  06/24/2015 02/23/2015 12/29/2014  Falls in the past year? Yes No No  Number falls in past yr: 1 - -  Injury with Fall? No - -    Functional Status Survey: Is the patient deaf or have difficulty hearing?: No Does the patient have difficulty seeing, even when wearing glasses/contacts?: No Does the patient have difficulty concentrating, remembering, or making decisions?: No Does the patient have difficulty walking or climbing stairs?: No Does the patient have difficulty dressing or bathing?: No Does the patient have difficulty doing errands alone such as visiting a doctor's office or shopping?: No    Assessment & Plan   1. Eye problem  Likely allergies, but patient states it feels different, it has been going on for months, start Pad[place] and refer to opthalmology  - Ambulatory referral to Ophthalmology - Olopatadine HCl (PATADAY) 0.2 % SOLN; Apply 1 drop to eye daily.  Dispense: 2.5 mL; Refill: 0  2. Type 2 diabetes mellitus with diabetic nephropathy, without long-term current use of insulin ([place])  She will call back with the name of glucometer her insurance pays for - Ambulatory referral to Ophthalmology - Hemoglobin A1c  3. Insomnia  Could not tolerate Seroquel, failed Ambien. We will add Belsomra -Belsomra 10 mg one to two po qhs for sleep #30 and no refills - ALPRAZolam (XANAX) 0.5 MG tablet; Take 1 tablet (0.5 mg total) by mouth 2 (two) times daily as needed.  Dispense: 60 tablet; Refill: 2  4. Intertrigo  - ketoconazole (NIZORAL) 2 % cream; Apply 1 application  topically 2 (two) times daily. Mix with hydrocortison 10 otc ( patient will buy it otc )  Dispense: 60 g; Refill: 2  5. Migraine without  aura and without status migrainosus, not intractable  - HYDROcodone-acetaminophen (NORCO) 10-325 MG tablet; Take 1 tablet by mouth every 6 (six) hours as needed. Take 1 tablet daily as needed for headaches  Dispense: 30 tablet; Refill: 0  6. Chronic obstructive pulmonary disease, unspecified COPD type ([place])  Refuses Spiromtery at this time, only using Proair prn, she is allergic to Spiriva, Advair. On nocturnal oxygen  7. OSA (obstructive sleep apnea)  We need to combine CPAP with oxygen  8. Paroxysmal atrial fibrillation (HCC)  Sees Dr. Rockey Situ  9. Unstable angina (HCC)  Stable on medication   10. Colon cancer screening  - Cologuard  11. Leukocytosis  - CBC with Differential/Platelet  12. Hyperlipidemia  - Lipid panel  13. B12 deficiency  - Vitamin B12  14. Long-term use of high-risk medication  - Comprehensive metabolic panel  15. Oxygen desaturation during sleep  Uses 2.5  liters of nasal cannula oxygen at night

## 2015-08-01 ENCOUNTER — Telehealth: Payer: Self-pay | Admitting: Family Medicine

## 2015-08-01 NOTE — Telephone Encounter (Signed)
I am so sorry, I don't have documentation about her back pain in recent records and the " back doctor " will need that before she can be accepted as a patient.  I also prescribed hydrocodone for her migraines, not for back pain and it is a controlled medication . She will have to wait until I can see her. I recommend her to go to Urgent Care until she can be seen at our office. I am sorry

## 2015-08-01 NOTE — Telephone Encounter (Signed)
Patient is requesting a referral to be seen at a back doctor due to her fall a few years back (not a chiropractic). States she is in a lot of pain and we have no availabilities. Has taken her pain medication and is needing a refill on those as well

## 2015-08-02 ENCOUNTER — Telehealth: Payer: Self-pay

## 2015-08-02 NOTE — Telephone Encounter (Signed)
I tried to contact this patient back to review Dr. Ancil Boozer' message, but there was no answer and her voicemail has not been setup yet so I was not able to leave a message.  Steele Sizer, MD at 08/01/2015 9:25 PM I am so sorry, I don't have documentation about her back pain in recent records and the " back doctor " will need that before she can be accepted as a patient.  I also prescribed hydrocodone for her migraines, not for back pain and it is a controlled medication . She will have to wait until I can see her. I recommend her to go to Urgent Care until she can be seen at our office. I am sorry  Samson Frederic at 08/01/2015 8:16 AM Patient is requesting a referral to be seen at a back doctor due to her fall a few years back (not a chiropractic). States she is in a lot of pain and we have no availabilities. Has taken her pain medication and is needing a refill on those as well

## 2015-08-02 NOTE — Telephone Encounter (Signed)
Patient as informed of Dr. Ancil Boozer' message and was given an appt by Cassandra for an office visit regarding her back pain so she can get the requested referral.

## 2015-08-03 ENCOUNTER — Encounter: Payer: Self-pay | Admitting: Cardiovascular Disease

## 2015-08-03 ENCOUNTER — Ambulatory Visit (INDEPENDENT_AMBULATORY_CARE_PROVIDER_SITE_OTHER): Payer: Medicaid Other | Admitting: Cardiovascular Disease

## 2015-08-03 VITALS — BP 124/74 | HR 73 | Ht 60.5 in | Wt 221.5 lb

## 2015-08-03 DIAGNOSIS — I200 Unstable angina: Secondary | ICD-10-CM

## 2015-08-03 DIAGNOSIS — I25110 Atherosclerotic heart disease of native coronary artery with unstable angina pectoris: Secondary | ICD-10-CM

## 2015-08-03 DIAGNOSIS — I480 Paroxysmal atrial fibrillation: Secondary | ICD-10-CM

## 2015-08-03 DIAGNOSIS — F17200 Nicotine dependence, unspecified, uncomplicated: Secondary | ICD-10-CM

## 2015-08-03 DIAGNOSIS — Z720 Tobacco use: Secondary | ICD-10-CM

## 2015-08-03 DIAGNOSIS — I25118 Atherosclerotic heart disease of native coronary artery with other forms of angina pectoris: Secondary | ICD-10-CM

## 2015-08-03 DIAGNOSIS — E785 Hyperlipidemia, unspecified: Secondary | ICD-10-CM

## 2015-08-03 NOTE — Assessment & Plan Note (Signed)
We have encouraged her to continue to work on weaning her cigarettes and smoking cessation. She will continue to work on this and does not want any assistance with chantix.  

## 2015-08-03 NOTE — Assessment & Plan Note (Signed)
Maintaining normal sinus rhythm , no changes to her medications

## 2015-08-03 NOTE — Assessment & Plan Note (Signed)
Currently not having any symptoms concerning for angina.  Recommended smoking cessation, no further testing ordered

## 2015-08-03 NOTE — Assessment & Plan Note (Signed)
Recommended that she stay on her current medications including aspirin and Plavix  Currently without symptoms

## 2015-08-03 NOTE — Assessment & Plan Note (Signed)
Encouraged her to stay on her Crestor  Labwork pending through Dr. Ancil Boozer.  If LDL greater than 70, He could add Zetia  daily

## 2015-08-03 NOTE — Patient Instructions (Addendum)
You are doing well. No medication changes were made.  Please call us if you have new issues that need to be addressed before your next appt.  Your physician wants you to follow-up in: 6 months.  You will receive a reminder letter in the mail two months in advance. If you don't receive a letter, please call our office to schedule the follow-up appointment.  Steps to Quit Smoking  Smoking tobacco can be harmful to your health and can affect almost every organ in your body. Smoking puts you, and those around you, at risk for developing many serious chronic diseases. Quitting smoking is difficult, but it is one of the best things that you can do for your health. It is never too late to quit. WHAT ARE THE BENEFITS OF QUITTING SMOKING? When you quit smoking, you lower your risk of developing serious diseases and conditions, such as:  Lung cancer or lung disease, such as COPD.  Heart disease.  Stroke.  Heart attack.  Infertility.  Osteoporosis and bone fractures. Additionally, symptoms such as coughing, wheezing, and shortness of breath may get better when you quit. You may also find that you get sick less often because your body is stronger at fighting off colds and infections. If you are pregnant, quitting smoking can help to reduce your chances of having a baby of low birth weight. HOW DO I GET READY TO QUIT? When you decide to quit smoking, create a plan to make sure that you are successful. Before you quit:  Pick a date to quit. Set a date within the next two weeks to give you time to prepare.  Write down the reasons why you are quitting. Keep this list in places where you will see it often, such as on your bathroom mirror or in your car or wallet.  Identify the people, places, things, and activities that make you want to smoke (triggers) and avoid them. Make sure to take these actions:  Throw away all cigarettes at home, at work, and in your car.  Throw away smoking accessories,  such as Scientist, research (medical).  Clean your car and make sure to empty the ashtray.  Clean your home, including curtains and carpets.  Tell your family, friends, and coworkers that you are quitting. Support from your loved ones can make quitting easier.  Talk with your health care provider about your options for quitting smoking.  Find out what treatment options are covered by your health insurance. WHAT STRATEGIES CAN I USE TO QUIT SMOKING?  Talk with your healthcare provider about different strategies to quit smoking. Some strategies include:  Quitting smoking altogether instead of gradually lessening how much you smoke over a period of time. Research shows that quitting "cold [place]" is more successful than gradually quitting.  Attending in-person counseling to help you build problem-solving skills. You are more likely to have success in quitting if you attend several counseling sessions. Even short sessions of 10 minutes can be effective.  Finding resources and support systems that can help you to quit smoking and remain smoke-free after you quit. These resources are most helpful when you use them often. They can include:  Online chats with a Social worker.  Telephone quitlines.  Printed Furniture conservator/restorer.  Support groups or group counseling.  Text messaging programs.  Mobile phone applications.  Taking medicines to help you quit smoking. (If you are pregnant or breastfeeding, talk with your health care provider first.) Some medicines contain nicotine and some do not. Both types  of medicines help with cravings, but the medicines that include nicotine help to relieve withdrawal symptoms. Your health care provider may recommend:  Nicotine patches, gum, or lozenges.  Nicotine inhalers or sprays.  Non-nicotine medicine that is taken by mouth. Talk with your health care provider about combining strategies, such as taking medicines while you are also receiving in-person counseling.  Using these two strategies together makes you more likely to succeed in quitting than if you used either strategy on its own. If you are pregnant or breastfeeding, talk with your health care provider about finding counseling or other support strategies to quit smoking. Do not take medicine to help you quit smoking unless told to do so by your health care provider. WHAT THINGS CAN I DO TO MAKE IT EASIER TO QUIT? Quitting smoking might feel overwhelming at first, but there is a lot that you can do to make it easier. Take these important actions:  Reach out to your family and friends and ask that they support and encourage you during this time. Call telephone quitlines, reach out to support groups, or work with a counselor for support.  Ask people who smoke to avoid smoking around you.  Avoid places that trigger you to smoke, such as bars, parties, or smoke-break areas at work.  Spend time around people who do not smoke.  Lessen stress in your life, because stress can be a smoking trigger for some people. To lessen stress, try:  Exercising regularly.  Deep-breathing exercises.  Yoga.  Meditating.  Performing a body scan. This involves closing your eyes, scanning your body from head to toe, and noticing which parts of your body are particularly tense. Purposefully relax the muscles in those areas.  Download or purchase mobile phone or tablet apps (applications) that can help you stick to your quit plan by providing reminders, tips, and encouragement. There are many free apps, such as QuitGuide from the [employer] Office manager for Disease Control and Prevention). You can find other support for quitting smoking (smoking cessation) through smokefree.gov and other websites. HOW WILL I FEEL WHEN I QUIT SMOKING? Within the first 24 hours of quitting smoking, you may start to feel some withdrawal symptoms. These symptoms are usually most noticeable 2-3 days after quitting, but they usually do not last beyond  2-3 weeks. Changes or symptoms that you might experience include:  Mood swings.  Restlessness, anxiety, or irritation.  Difficulty concentrating.  Dizziness.  Strong cravings for sugary foods in addition to nicotine.  Mild weight gain.  Constipation.  Nausea.  Coughing or a sore throat.  Changes in how your medicines work in your body.  A depressed mood.  Difficulty sleeping (insomnia). After the first 2-3 weeks of quitting, you may start to notice more positive results, such as:  Improved sense of smell and taste.  Decreased coughing and sore throat.  Slower heart rate.  Lower blood pressure.  Clearer skin.  The ability to breathe more easily.  Fewer sick days. Quitting smoking is very challenging for most people. Do not get discouraged if you are not successful the first time. Some people need to make many attempts to quit before they achieve long-term success. Do your best to stick to your quit plan, and talk with your health care provider if you have any questions or concerns.   This information is not intended to replace advice given to you by your health care provider. Make sure you discuss any questions you have with your health care provider.  Document Released: 04/03/2001 Document Revised: 08/24/2014 Document Reviewed: 08/24/2014 Elsevier Interactive Patient Education 2016 [employer]. Smoking Hazards Smoking cigarettes is extremely bad for your health. Tobacco smoke has over 200 known poisons in it. It contains the poisonous gases nitrogen oxide and carbon monoxide. There are over 60 chemicals in tobacco smoke that cause cancer. Some of the chemicals found in cigarette smoke include:   Cyanide.   Benzene.   Formaldehyde.   Methanol (wood alcohol).   Acetylene (fuel used in welding torches).   Ammonia.  Even smoking lightly shortens your life expectancy by several years. You can greatly reduce the risk of medical problems for you and your  family by stopping now. Smoking is the most preventable cause of death and disease in our society. Within days of quitting smoking, your circulation improves, you decrease the risk of having a heart attack, and your lung capacity improves. There may be some increased phlegm in the first few days after quitting, and it may take months for your lungs to clear up completely. Quitting for 10 years reduces your risk of developing lung cancer to almost that of a nonsmoker.  WHAT ARE THE RISKS OF SMOKING? Cigarette smokers have an increased risk of many serious medical problems, including:  Lung cancer.   Lung disease (such as pneumonia, bronchitis, and emphysema).   Heart attack and chest pain due to the heart not getting enough oxygen (angina).   Heart disease and peripheral blood vessel disease.   Hypertension.   Stroke.   Oral cancer (cancer of the lip, mouth, or voice box).   Bladder cancer.   Pancreatic cancer.   Cervical cancer.   Pregnancy complications, including premature birth.   Stillbirths and smaller newborn babies, birth defects, and genetic damage to sperm.   Early menopause.   Lower estrogen level for women.   Infertility.   Facial wrinkles.   Blindness.   Increased risk of broken bones (fractures).   Senile dementia.   Stomach ulcers and internal bleeding.   Delayed wound healing and increased risk of complications during surgery. Because of secondhand smoke exposure, children of smokers have an increased risk of the following:   Sudden infant death syndrome (SIDS).   Respiratory infections.   Lung cancer.   Heart disease.   Ear infections.  WHY IS SMOKING ADDICTIVE? Nicotine is the chemical agent in tobacco that is capable of causing addiction or dependence. When you smoke and inhale, nicotine is absorbed rapidly into the bloodstream through your lungs. Both inhaled and noninhaled nicotine may be addictive.  WHAT ARE THE  BENEFITS OF QUITTING?  There are many health benefits to quitting smoking. Some are:   The likelihood of developing cancer and heart disease decreases. Health improvements are seen almost immediately.   Blood pressure, pulse rate, and breathing patterns start returning to normal soon after quitting.   People who quit may see an improvement in their overall quality of life.  HOW DO YOU QUIT SMOKING? Smoking is an addiction with both physical and psychological effects, and longtime habits can be hard to change. Your health care provider can recommend:  Programs and community resources, which may include group support, education, or therapy.  Replacement products, such as patches, gum, and nasal sprays. Use these products only as directed. Do not replace cigarette smoking with electronic cigarettes (commonly called e-cigarettes). The safety of e-cigarettes is unknown, and some may contain harmful chemicals. FOR MORE INFORMATION  American Lung Association: www.lung.org  American Cancer Society: www.cancer.org  This information is not intended to replace advice given to you by your health care provider. Make sure you discuss any questions you have with your health care provider.   Document Released: 05/17/2004 Document Revised: 01/28/2013 Document Reviewed: 09/29/2012 Elsevier Interactive Patient Education 2016 [place] WHAT IS SECONDHAND SMOKE? Secondhand smoke is smoke that comes from burning tobacco. It could be the smoke from a cigarette, a pipe, or a cigar. Even if you are not the one smoking, secondhand smoke exposes you to the dangers of smoking. This is called involuntary, or passive, smoking. There are two types of secondhand smoke:  Sidestream smoke is the smoke that comes off the lighted end of a cigarette, pipe, or cigar.  This type of smoke has the highest amount of cancer-causing agents (carcinogens).  The particles in sidestream smoke are smaller.  They get into your lungs more easily.  Mainstream smoke is the smoke that is exhaled by a person who is smoking.  This type of smoke is also dangerous to your health. HOW CAN SECONDHAND SMOKE AFFECT MY HEALTH? Studies show that there is no safe level of secondhand smoke. This smoke contains thousands of chemicals. At least 62 of them are known to cause cancer. Secondhand smoke can also cause many other health problems. It has been linked to:  Lung cancer.  Cancer of the voice box (larynx) or throat.  Cancer of the sinuses.  Brain cancer.  Bladder cancer.  Stomach cancer.  Breast cancer.  White blood cell cancers (lymphoma and leukemia).  Brain and liver tumors in children.  Heart disease and stroke in adults.  Pregnancy loss (miscarriage).  Diseases in children, such as:  Asthma.  Lung infections.  Ear infections.  Sudden infant death syndrome (SIDS).  Slow growth. WHERE CAN I BE AT RISK FOR EXPOSURE TO SECONDHAND SMOKE?   For adults, the workplace is the main source of exposure to secondhand smoke.  Your workplace should have a policy separating smoking areas from nonsmoking areas.  Smoking areas should have a system for ventilating and cleaning the air.  For children, the home may be the most dangerous place for exposure to secondhand smoke.  Children who live in apartment buildings may be at risk from smoke drifting from hallways or other people's homes.  For everyone, many public places are possible sources of exposure to secondhand smoke.  These places include restaurants, shopping centers, and parks. HOW CAN I REDUCE MY RISK FOR EXPOSURE TO SECONDHAND SMOKE? The most important thing you can do is not smoke. Discourage family members from smoking. Other ways to reduce exposure for you and your family include the following:  Keep your home smoke free.  Make sure your child care providers do not smoke.  Warn your child about the dangers of smoking  and secondhand smoke.  Do not allow smoking in your car. When someone smokes in a car, all the damaging chemicals from the smoke are confined in a small area.  Avoid public places where smoking is allowed.   This information is not intended to replace advice given to you by your health care provider. Make sure you discuss any questions you have with your health care provider.   Document Released: 05/17/2004 Document Revised: 04/30/2014 Document Reviewed: 07/24/2013 Elsevier Interactive Patient Education [employer].

## 2015-08-03 NOTE — Progress Notes (Signed)
Patient ID: Barbara Bowen, female    DOB: 1955/08/12, 60 y.o.   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  She continues to smoke over one pack per day, no desire to quit Taking care of her elderly family member, significant stress Lab work ordered March 2017, she has not done this yet for primary care Tolerating Crestor Occasional ankle swelling Reports she is having "TIAs" on a regular basis, "in fact is having one now" in the clinic room Reports that she has vision issues typically in one eye, occasionally feels lightheaded, feel slow. She calls these her TIAs  Lab work reviewed showing hemoglobin A1c 7.0. She is reluctant to start any medications for diabetes, reports that it drops her sugars too low  EKG shows normal sinus rhythm with T-wave abnormality, no significant change compared to prior EKG. Hemoglobin A1c 7.2  Other past medical history reviewed Cardiac catheterization in 2015 95% distal RCA disease. With DES placed. 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 Stent size was 3.0 x 23 mm stent  Previous 48-hour Holter monitor that showed frequent PVCs. She continues to take low-dose beta blocker in the evening. bystolic  10 mg daily She is taking her Crestor inconsistently. She continues to use oxygen at nighttime  History of obstructive sleep apnea and only uses nasal cannula oxygen. She reports having sleep study x2 in the past. The second sleep study she did not sleep for very long. She currently sleeps 2 to 3 hours per night, wakes up at 2 AM.  no significant improvement In her sleep hygiene with nasal cannula oxygen She still wakes herself up snoring. She has seen neurology in the past with no insight into her sleep hygiene.  Cardiac catheterization from February 2012: Left main: Long. Anomalous origin off right cooronary cusp. Appears to run anterior to the aorta  30-40%  LAD:  mild luminal plaqure.. small mid intramyocardial bridge without flow limitation.  LCX:  small. made up primarily of an OM-1. 30-40% ostiall lesion   RCA: Large dominant vessel.Proximal stent widely patent. 40-50% mid after stent. mild plaquing distally.  LV: EF55% no regional wall motion abnormalities Stable CAD with anomalous left main and normal LV function.    Allergies  Allergen Reactions  . Amoxicillin-Pot Clavulanate     Vomiting   . Cefprozil     Vomiting   . Eggs Or Egg-Derived Products     Hives   . Esomeprazole Magnesium     unknown  . Montelukast Sodium     unknown  . Omeprazole     unknown  . Penicillins     Vomiting blood  . Prednisone     Rapid heart beat & difficulty breathing  . Spiriva [Tiotropium Bromide Monohydrate]  HA  . Advair Diskus [Fluticasone-Salmeterol] Palpitations    Also happened with Symbicort    Outpatient Encounter Prescriptions as of 08/03/2015  Medication Sig  . albuterol (PROAIR HFA) 108 (90 BASE) MCG/ACT inhaler Inhale 2 puffs into the lungs every 6 (six) hours as needed.  . ALPRAZolam (XANAX) 0.5 MG tablet Take 1 tablet (0.5 mg total) by mouth 2 (two) times daily as needed.  Marland Kitchen aspirin 81 MG EC tablet Take 81 mg by mouth daily.   . clopidogrel (PLAVIX) 75 MG tablet  TAKE 1 TABLET (75 MG TOTAL) BY MOUTH DAILY.  Marland Kitchen HYDROcodone-acetaminophen (NORCO) 10-325 MG tablet Take 1 tablet by mouth every 6 (six) hours as needed. Take 1 tablet daily as needed for headaches  . ketoconazole (NIZORAL) 2 % cream Apply 1 application topically 2 (two) times daily. Mix with hydrocortison 10 otc ( patient will buy it otc )  . nebivolol (BYSTOLIC) 10 MG tablet Take 1 tablet (10 mg total) by mouth daily.  . nitroGLYCERIN (NITRO-DUR) 0.2 mg/hr patch Place 1 patch (0.2 mg total) onto the skin daily.  . nitroGLYCERIN (NITROSTAT) 0.4 MG SL tablet Place 1 tablet (0.4 mg total) under the tongue every 5 (five) minutes as needed.  . Olopatadine HCl (PATADAY) 0.2 % SOLN Apply 1 drop to eye daily.  . rosuvastatin (CRESTOR) 40 MG tablet Take 1 tablet (40 mg total) by mouth daily.  . [DISCONTINUED] Suvorexant (BELSOMRA) 10 MG TABS Take 1-2 tablets by mouth 30 (thirty) minutes before procedure. (Patient not taking: Reported on 08/03/2015)   No facility-administered encounter medications on file as of 08/03/2015.    Past Medical History  Diagnosis Date  . Coronary artery disease     a. 09/2007 s/p PCI/DES to prox RCA;  b. 2012 Cath: nonobs dzs;  c. 01/2014 PCI: 95d (3.0x23 Xience [place]), RPDA 80 (PTCA), EF 60%;  d. 11/2014 Cath: LM anomalous, LAD/D1/D2 min irregs, LCX small, min irregs, OM2 small, RCA 20p ISR, 10d ISR, EF 65%.  . Essential hypertension   . Hyperlipidemia   . Paroxysmal atrial fibrillation (HCC)     a. not appreciated on holter 09/2013;  b. CHA2DS2VASc = 5 (htn, DM, h/o TIA, vasc dzs, female) - not on [place].  Marland Kitchen Hx-TIA (transient ischemic attack)   . Anxiety   . COPD (chronic obstructive pulmonary disease) ([place])   . OA (osteoarthritis)   . Tobacco use disorder     a. does not believe cigarettes contribute to her dyspnea/asthma.    . Enthesopathy of hip region   . Plantar fascial fibromatosis   . Proteinuria   . DM type 2 (diabetes mellitus, type 2) ([place])   . Unspecified  tinnitus   . Insomnia, unspecified   . Unspecified sleep apnea     a. Wears O2 via [place] @ [facility].  . Lipoma of other skin and subcutaneous tissue   . Leukocytosis, unspecified   . Palpitations   . Occlusion and stenosis of carotid artery without mention of cerebral infarction   . Stroke ([place])   . CHF (congestive heart failure) ([place])   . Headache     Past Surgical History  Procedure Laterality Date  . Abdominal hysterectomy    . Appendectomy    . Exploratory thoracotomy    . Mole removal    . Breast surgery Right 06-09-2012    biopsy x2 9 oclock and 11 oclock  . Cardiac catheterization  2009    s/p right coronary artery drug-eluting stent  . Coronary angioplasty  2015  . Cardiac catheterization N/A 12/16/2014    Procedure: Left Heart Cath and Coronary Angiography;  Surgeon: Minna Merritts, MD;  Location: [facility];  Service: Cardiovascular;  Laterality: N/A;    Social History  reports that she has been smoking Cigarettes.  She started smoking about 37 years ago. She has a 74 pack-year smoking history. She has never used smokeless tobacco. She reports that she does not drink alcohol or use illicit drugs.  Family History family history includes Breast cancer in her maternal aunt; Coronary artery disease in her mother; Diabetes type II in her mother; Heart disease in her father.  Review of Systems  Constitutional: Negative.   Eyes: Positive for visual disturbance.  Respiratory: Positive for shortness of breath.   Cardiovascular: Positive for leg swelling.  Gastrointestinal: Negative.   Musculoskeletal: Negative.   Skin: Negative.   Neurological: Negative.   Hematological: Negative.   Psychiatric/Behavioral: Positive for sleep disturbance.  All other systems reviewed and are negative.   BP 124/74 mmHg  Pulse 73  Ht 5' 0.5" (1.537 m)  Wt 221 lb 8 oz (100.472 kg)  BMI 42.53 kg/m2  Physical Exam  Constitutional: She is oriented to person, place, and time. She  appears well-developed and well-nourished.  Obese  HENT:  Head: Normocephalic.  Nose: Nose normal.  Mouth/Throat: Oropharynx is clear and moist.  Eyes: Conjunctivae are normal. Pupils are equal, round, and reactive to light.  Neck: Normal range of motion. Neck supple. No JVD present.  Cardiovascular: Normal rate, regular rhythm, S1 normal, S2 normal, normal heart sounds and intact distal pulses.  Exam reveals no gallop and no friction rub.   No murmur heard. Pulmonary/Chest: Effort normal. No respiratory distress. She has decreased breath sounds. She has no wheezes. She has no rales. She exhibits no tenderness.  Abdominal: Soft. Bowel sounds are normal. She exhibits no distension. There is no tenderness.  Musculoskeletal: Normal range of motion. She exhibits no edema or tenderness.  Lymphadenopathy:    She has no cervical adenopathy.  Neurological: She is alert and oriented to person, place, and time. Coordination normal.  Skin: Skin is warm and dry. No rash noted. No erythema.  Psychiatric: She has a normal mood and affect. Her behavior is normal. Judgment and thought content normal.    Assessment and Plan  Nursing note and vitals reviewed.

## 2015-08-23 ENCOUNTER — Encounter: Payer: Self-pay | Admitting: Family Medicine

## 2015-08-23 ENCOUNTER — Ambulatory Visit (INDEPENDENT_AMBULATORY_CARE_PROVIDER_SITE_OTHER): Payer: Medicaid Other | Admitting: Family Medicine

## 2015-08-23 VITALS — BP 128/72 | HR 82 | Temp 98.1°F | Resp 16 | Wt 216.6 lb

## 2015-08-23 DIAGNOSIS — G43009 Migraine without aura, not intractable, without status migrainosus: Secondary | ICD-10-CM

## 2015-08-23 DIAGNOSIS — H9313 Tinnitus, bilateral: Secondary | ICD-10-CM | POA: Insufficient documentation

## 2015-08-23 DIAGNOSIS — M5417 Radiculopathy, lumbosacral region: Secondary | ICD-10-CM | POA: Diagnosis not present

## 2015-08-23 DIAGNOSIS — M5416 Radiculopathy, lumbar region: Secondary | ICD-10-CM

## 2015-08-23 MED ORDER — GABAPENTIN 100 MG PO CAPS: 100.0000 mg | ORAL_CAPSULE | Freq: Three times a day (TID) | ORAL | Status: DC

## 2015-08-23 MED ORDER — TIZANIDINE HCL 4 MG PO TABS: 4.0000 mg | ORAL_TABLET | Freq: Three times a day (TID) | ORAL | Status: DC | PRN

## 2015-08-23 MED ORDER — HYDROCODONE-ACETAMINOPHEN 10-325 MG PO TABS: 1.0000 | ORAL_TABLET | Freq: Four times a day (QID) | ORAL | Status: DC | PRN

## 2015-08-23 NOTE — Patient Instructions (Signed)

## 2015-08-23 NOTE — Progress Notes (Signed)
Name: Barbara Bowen   MRN: [id number]    DOB: March 06, 1956   Date:08/23/2015       Progress Note  Subjective  Chief Complaint  Chief Complaint  Patient presents with  . Referral    patient is here for a referral regarding her intermittent lower back & right hip pain.    HPI  Right low back pain with radiculitis: she states she fell down steps 10 years ago and developed acute low back pain and radiculitis. She had some imaging at the time, but not sure of what type. She can't take prednisone. She states this episode of pain started about one month ago. Sudden onset - woke up with the pain. Constant pain, at this time 4/10 - during rest. It goes up to 8-9/10 at times. Pain is described as sharp , no numbness or tingling. Pain goes from right lower back to lateral aspect of right knee. Hydrocodone did not help with pain. She states initially had difficulty walking, but she went to [facility] and was given a muscle relaxer and something else. Pain is not as intense but she would like a referral to Ortho.   Patient Active Problem List   Diagnosis Date Noted  . B12 deficiency 03/31/2015  . OSA (obstructive sleep apnea) 03/31/2015  . 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 Intertrigo 02/23/2015  . Coronary artery disease   . Insomnia 12/29/2014  . Migraine without aura and without status migrainosus, not intractable 12/29/2014  . Leukocytosis 12/24/2014  . Macrocytic 12/24/2014  . Unstable angina ([place])   . Essential hypertension   . Paroxysmal atrial fibrillation (HCC)   . Diabetes mellitus with renal manifestation ([place])   . History of MI (myocardial infarction) 10/30/2013  . Benign neoplasm of breast 09/12/2012  . Nocturnal hypoxemia due to obesity 09/09/2012  . COPD (chronic obstructive pulmonary disease) ([place]) 09/09/2012  . Smoking 03/27/2011  . Hyperlipidemia 10/28/2009  . CAD (coronary artery disease), native coronary artery 10/28/2009  .  Shortness of breath 10/28/2009  . Angina pectoris ([place]) 10/28/2009    Past Surgical History  Procedure Laterality Date  . Abdominal hysterectomy    . Appendectomy    . Exploratory thoracotomy    . Mole removal    . Breast surgery Right 06-09-2012    biopsy x2 9 oclock and 11 oclock  . Cardiac catheterization  2009    s/p right coronary artery drug-eluting stent  . Coronary angioplasty  2015  . Cardiac catheterization N/A 12/16/2014    Procedure: Left Heart Cath and Coronary Angiography;  Surgeon: Minna Merritts, MD;  Location: [facility];  Service: Cardiovascular;  Laterality: N/A;    Family History  Problem Relation Age of Onset  . Heart disease Father     CAD  . Breast cancer Maternal Aunt   . Coronary artery disease Mother   . Diabetes type II Mother     Social History   Social History  . Marital Status: Married    Spouse Name: N/A  . Number of Children: N/A  . Years of Education: N/A   Occupational History  . unemployed    Social History Main Topics  . Smoking status: Current Every Day Smoker -- 2.00 packs/day for 37 years    Types: Cigarettes    Start date: 02/01/1978  . Smokeless tobacco: Never Used  . Alcohol Use: No  . Drug Use: No  . Sexual Activity: Not Currently   Other Topics  Concern  . Not on file   Social History Narrative   Lives in [place] with husband.  Does not work.  Does not routinely exercise.     Current outpatient prescriptions:  .  albuterol (PROAIR HFA) 108 (90 BASE) MCG/ACT inhaler, Inhale 2 puffs into the lungs every 6 (six) hours as needed., Disp: 1 Inhaler, Rfl: 0 .  ALPRAZolam (XANAX) 0.5 MG tablet, Take 1 tablet (0.5 mg total) by mouth 2 (two) times daily as needed., Disp: 60 tablet, Rfl: 2 .  aspirin 81 MG EC tablet, Take 81 mg by mouth daily. , Disp: , Rfl:  .  clopidogrel (PLAVIX) 75 MG tablet, TAKE 1 TABLET (75 MG TOTAL) BY MOUTH DAILY., Disp: 90 tablet, Rfl: 3 .  gabapentin (NEURONTIN) 100 MG capsule, Take 1-3  capsules (100-300 mg total) by mouth 3 (three) times daily., Disp: 100 capsule, Rfl: 0 .  HYDROcodone-acetaminophen (NORCO) 10-325 MG tablet, Take 1 tablet by mouth every 6 (six) hours as needed. Take 1 tablet daily as needed for headaches, Disp: 30 tablet, Rfl: 0 .  ketoconazole (NIZORAL) 2 % cream, Apply 1 application topically 2 (two) times daily. Mix with hydrocortison 10 otc ( patient will buy it otc ), Disp: 60 g, Rfl: 2 .  nebivolol (BYSTOLIC) 10 MG tablet, Take 1 tablet (10 mg total) by mouth daily., Disp: 90 tablet, Rfl: 3 .  nitroGLYCERIN (NITRO-DUR) 0.2 mg/hr patch, Place 1 patch (0.2 mg total) onto the skin daily., Disp: 30 patch, Rfl: 6 .  nitroGLYCERIN (NITROSTAT) 0.4 MG SL tablet, Place 1 tablet (0.4 mg total) under the tongue every 5 (five) minutes as needed., Disp: 25 tablet, Rfl: 6 .  Olopatadine HCl (PATADAY) 0.2 % SOLN, Apply 1 drop to eye daily., Disp: 2.5 mL, Rfl: 0 .  rosuvastatin (CRESTOR) 40 MG tablet, Take 1 tablet (40 mg total) by mouth daily., Disp: 90 tablet, Rfl: 3  Allergies  Allergen Reactions  . Amoxicillin-Pot Clavulanate     Vomiting   . Cefprozil     Vomiting   . Eggs Or Egg-Derived Products     Hives   . Esomeprazole Magnesium     unknown  . Montelukast Sodium     unknown  . Omeprazole     unknown  . Penicillins     Vomiting blood  . Prednisone     Rapid heart beat & difficulty breathing  . Spiriva [Tiotropium Bromide Monohydrate]     HA  . Advair Diskus [Fluticasone-Salmeterol] Palpitations    Also happened with Symbicort     ROS  Ten systems reviewed and is negative except as mentioned in HPI , she also has a chronic cough, insomnia.  Objective  Filed Vitals:   08/23/15 0930  BP: 128/72  Pulse: 82  Temp: 98.1 F (36.7 C)  TempSrc: Oral  Resp: 16  Weight: 216 lb 9.6 oz (98.249 kg)  SpO2: 93%    Body mass index is 41.59 kg/(m^2).  Physical Exam  Constitutional: Patient appears well-developed and well-nourished. Obese  No  distress.  HEENT: head atraumatic, normocephalic, pupils equal and reactive to light,  neck supple, throat within normal limits Cardiovascular: Normal rate, regular rhythm and normal heart sounds.  No murmur heard. No BLE edema. Pulmonary/Chest: Effort normal and breath sounds normal. No respiratory distress. Abdominal: Soft.  There is no tenderness. Psychiatric: Patient has a normal mood and affect. behavior is normal. Judgment and thought content normal. Muscular Skeletal: negative straight leg raise, pain during palpation of right lower  back and also during palpation of right outer hip, normal rom of hip. No rashes  Recent Results (from the past 2160 hour(s))  POCT HgB A1C     Status: Abnormal   Collection Time: 06/24/15 10:37 AM  Result Value Ref Range   Hemoglobin A1C 7.0       PHQ2/9: Depression screen [facility] 2/9 08/23/2015 06/24/2015 02/23/2015 12/29/2014  Decreased Interest 0 0 0 0  Down, Depressed, Hopeless 0 1 0 0  PHQ - 2 Score 0 1 0 0     Fall Risk: Fall Risk  08/23/2015 06/24/2015 02/23/2015 12/29/2014  Falls in the past year? Yes Yes No No  Number falls in past yr: 1 1 - -  Injury with Fall? No No - -     Functional Status Survey: Is the patient deaf or have difficulty hearing?: No Does the patient have difficulty seeing, even when wearing glasses/contacts?: No Does the patient have difficulty concentrating, remembering, or making decisions?: No Does the patient have difficulty walking or climbing stairs?: No Does the patient have difficulty dressing or bathing?: No Does the patient have difficulty doing errands alone such as visiting a doctor's office or shopping?: No    Assessment & Plan  1. Lumbar back pain with radiculopathy affecting right lower extremity  Discussed possible side effects of medication and importance of titrating up and down medication slowly. She can't take prednisone.  - gabapentin (NEURONTIN) 100 MG capsule; Take 1-3 capsules (100-300 mg total) by  mouth 3 (three) times daily.  Dispense: 100 capsule; Refill: 0 - tiZANidine (ZANAFLEX) 4 MG tablet; Take 1 tablet (4 mg total) by mouth every 8 (eight) hours as needed for muscle spasms.  Dispense: 90 tablet; Refill: 0 - Ambulatory referral to Orthopedic Surgery   2. Migraine without aura and without status migrainosus, not intractable  - HYDROcodone-acetaminophen (NORCO) 10-325 MG tablet; Take 1 tablet by mouth every 6 (six) hours as needed. Take 1 tablet daily as needed for headaches  Dispense: 30 tablet; Refill: 0

## 2015-08-24 LAB — COMPREHENSIVE METABOLIC PANEL
ALK PHOS: 120 IU/L — AB (ref 39–117)
ALT: 26 IU/L (ref 0–32)
AST: 22 IU/L (ref 0–40)
Albumin/Globulin Ratio: 1.4 (ref 1.2–2.2)
Albumin: 4.3 g/dL (ref 3.5–5.5)
BUN/Creatinine Ratio: 8 — ABNORMAL LOW (ref 9–23)
BUN: 6 mg/dL (ref 6–24)
Bilirubin Total: 0.5 mg/dL (ref 0.0–1.2)
CALCIUM: 9.7 mg/dL (ref 8.7–10.2)
CO2: 26 mmol/L (ref 18–29)
CREATININE: 0.76 mg/dL (ref 0.57–1.00)
Chloride: 99 mmol/L (ref 96–106)
GFR calc Af Amer: 101 mL/min/{1.73_m2} (ref >59)
GFR, EST NON AFRICAN AMERICAN: 87 mL/min/{1.73_m2} (ref >59)
GLOBULIN, TOTAL: 3 g/dL (ref 1.5–4.5)
GLUCOSE: 162 mg/dL — AB (ref 65–99)
Potassium: 4.6 mmol/L (ref 3.5–5.2)
SODIUM: 144 mmol/L (ref 134–144)
Total Protein: 7.3 g/dL (ref 6.0–8.5)

## 2015-08-24 LAB — CBC WITH DIFFERENTIAL/PLATELET
Basophils Absolute: 0.1 10*3/uL (ref 0.0–0.2)
Basos: 1 %
EOS (ABSOLUTE): 0.3 10*3/uL (ref 0.0–0.4)
Eos: 2 %
HEMOGLOBIN: 16 g/dL — AB (ref 11.1–15.9)
Hematocrit: 48.1 % — ABNORMAL HIGH (ref 34.0–46.6)
Immature Grans (Abs): 0 10*3/uL (ref 0.0–0.1)
Immature Granulocytes: 0 %
LYMPHS ABS: 5.2 10*3/uL — AB (ref 0.7–3.1)
LYMPHS: 37 %
MCH: 33.5 pg — AB (ref 26.6–33.0)
MCHC: 33.3 g/dL (ref 31.5–35.7)
MCV: 101 fL — AB (ref 79–97)
Monocytes Absolute: 0.9 10*3/uL (ref 0.1–0.9)
Monocytes: 6 %
NEUTROS PCT: 54 %
Neutrophils Absolute: 7.6 10*3/uL — ABNORMAL HIGH (ref 1.4–7.0)
Platelets: 245 10*3/uL (ref 150–379)
RBC: 4.77 x10E6/uL (ref 3.77–5.28)
RDW: 13.2 % (ref 12.3–15.4)
WBC: 14.1 10*3/uL — AB (ref 3.4–10.8)

## 2015-08-24 LAB — HEMOGLOBIN A1C
ESTIMATED AVERAGE GLUCOSE: 212 mg/dL
HEMOGLOBIN A1C: 9 % — AB (ref 4.8–5.6)

## 2015-08-24 LAB — LIPID PANEL
CHOLESTEROL TOTAL: 203 mg/dL — AB (ref 100–199)
Chol/HDL Ratio: 5.3 ratio units — ABNORMAL HIGH (ref 0.0–4.4)
HDL: 38 mg/dL — ABNORMAL LOW (ref >39)
LDL CALC: 128 mg/dL — AB (ref 0–99)
Triglycerides: 187 mg/dL — ABNORMAL HIGH (ref 0–149)
VLDL CHOLESTEROL CAL: 37 mg/dL (ref 5–40)

## 2015-08-24 LAB — VITAMIN B12: VITAMIN B 12: 546 pg/mL (ref 211–946)

## 2015-08-25 ENCOUNTER — Other Ambulatory Visit: Payer: Self-pay | Admitting: Family Medicine

## 2015-08-25 DIAGNOSIS — R7989 Other specified abnormal findings of blood chemistry: Secondary | ICD-10-CM

## 2015-08-25 NOTE — Progress Notes (Signed)
Add-on labs

## 2015-09-30 ENCOUNTER — Encounter: Payer: Self-pay | Admitting: Family Medicine

## 2015-09-30 ENCOUNTER — Ambulatory Visit (INDEPENDENT_AMBULATORY_CARE_PROVIDER_SITE_OTHER): Payer: Medicaid Other | Admitting: Family Medicine

## 2015-09-30 VITALS — BP 124/76 | HR 78 | Temp 97.7°F | Resp 16 | Ht 61.0 in | Wt 217.4 lb

## 2015-09-30 DIAGNOSIS — G43009 Migraine without aura, not intractable, without status migrainosus: Secondary | ICD-10-CM | POA: Diagnosis not present

## 2015-09-30 DIAGNOSIS — G4736 Sleep related hypoventilation in conditions classified elsewhere: Secondary | ICD-10-CM

## 2015-09-30 DIAGNOSIS — M5417 Radiculopathy, lumbosacral region: Secondary | ICD-10-CM

## 2015-09-30 DIAGNOSIS — J449 Chronic obstructive pulmonary disease, unspecified: Secondary | ICD-10-CM

## 2015-09-30 DIAGNOSIS — D72829 Elevated white blood cell count, unspecified: Secondary | ICD-10-CM

## 2015-09-30 DIAGNOSIS — J45909 Unspecified asthma, uncomplicated: Secondary | ICD-10-CM | POA: Diagnosis not present

## 2015-09-30 DIAGNOSIS — I200 Unstable angina: Secondary | ICD-10-CM

## 2015-09-30 DIAGNOSIS — M5416 Radiculopathy, lumbar region: Secondary | ICD-10-CM

## 2015-09-30 DIAGNOSIS — G4733 Obstructive sleep apnea (adult) (pediatric): Secondary | ICD-10-CM | POA: Diagnosis not present

## 2015-09-30 DIAGNOSIS — G4700 Insomnia, unspecified: Secondary | ICD-10-CM

## 2015-09-30 DIAGNOSIS — F17200 Nicotine dependence, unspecified, uncomplicated: Secondary | ICD-10-CM

## 2015-09-30 DIAGNOSIS — Z720 Tobacco use: Secondary | ICD-10-CM | POA: Diagnosis not present

## 2015-09-30 DIAGNOSIS — I480 Paroxysmal atrial fibrillation: Secondary | ICD-10-CM

## 2015-09-30 DIAGNOSIS — E1121 Type 2 diabetes mellitus with diabetic nephropathy: Secondary | ICD-10-CM

## 2015-09-30 MED ORDER — UMECLIDINIUM BROMIDE 62.5 MCG/INH IN AEPB: 1.0000 | INHALATION_SPRAY | Freq: Every day | RESPIRATORY_TRACT | Status: DC

## 2015-09-30 MED ORDER — GABAPENTIN 100 MG PO CAPS: 100.0000 mg | ORAL_CAPSULE | Freq: Three times a day (TID) | ORAL | Status: DC

## 2015-09-30 MED ORDER — DULAGLUTIDE 1.5 MG/0.5ML ~~LOC~~ SOAJ: 1.5000 mg | SUBCUTANEOUS | Status: DC

## 2015-09-30 MED ORDER — METFORMIN HCL ER 500 MG PO TB24: 500.0000 mg | ORAL_TABLET | Freq: Every day | ORAL | Status: DC

## 2015-09-30 MED ORDER — ALPRAZOLAM 0.5 MG PO TABS: 0.5000 mg | ORAL_TABLET | Freq: Two times a day (BID) | ORAL | Status: DC | PRN

## 2015-09-30 NOTE — Progress Notes (Signed)
Name: Barbara Bowen   MRN: [id number]    DOB: 10/28/1955   Date:09/30/2015       Progress Note  Subjective  Chief Complaint  Chief Complaint  Patient presents with  . 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 She is currently on diet only and hgbA1C has gone from 7.0 to 9.0- explained that we need to start medication. She states she has been having ice cream daily lately. She is  unable to exercise because of COPD/CAD. She denies polyphagia, polyuria but she has polydipsia . Explained importance of starting ACE/ARB but she refuses it, she states she will consider it on her next visit.    CAD: last cath done by Dr. Rockey Situ was in 2016 , stents still open, she is on medication managements at this time. She states she has not been using the patch daily, only using prn, last episode of chest pain was about 4 days ago and it was a stabbing pain, it lasted a few seconds, but she used the patch and did not return.  When present the pain is described as burning like, but is not secondary to reflux.  HTN: taking betablocker, advised to add ACE/ARB but she refuses starting another medication today. Intermittent chest pain but palpitation is controlled with Bystolic  Migraine headaches: episodes are now daily headaches she states it is from smell from fertilizer from the neighbors house. She states improves when she takes xanax and goes to sleep.  Seen by neurologist but per patient can't tolerate any prophylactic medication, the only thing that works for her is hydrodocone prn, 30 pills to last 90 days.   Insomnia: she denies depression or anxiety, takes alprazolam for sleep, it does not last all night, we tried her on Seroquel but she states it caused her nightmares.   Hyperlipidemia: taking crestor daily now, LDL was not at goal on her last labs  COPD/Asthma : allergic to Spiriva, uses nocturnal oxygen, still smoking, has daily symptoms. She refuses taking any  medication. Symbicort and Advair caused palpitation, Spiriva caused migraine but is willing to try something on the same class now.   OSA: had a CPAP machine at home, but had problems getting oxygen with machine so they took it away, we need to have the same company supplying oxygen and CPAP supplies.   Right low back pain with radiculitis: she states she fell down steps 10 years ago and developed acute low back pain and radiculitis. She had some imaging at the time, but not sure of what type. She can't take prednisone. She states this episode of pain started about two months ago. Sudden onset - woke up with the pain. Constant pain, at this time 5/10 - during rest. It goes up to 8-9/10 at times. Pain is described as sharp , no numbness or tingling. Pain goes from right lower back to lateral aspect of right knee. Hydrocodone did not help with pain. She states gabapentin was not at the pharmacy, she has a follow up scheduled with Ortho coming up  Afib: denies palpitation , she has intermittent chest pain  Patient Active Problem List   Diagnosis Date Noted  . Bilateral tinnitus 08/23/2015  . B12 deficiency 03/31/2015  . OSA (obstructive sleep apnea) 03/31/2015  . 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 Intertrigo 02/23/2015  . Coronary artery disease   . Insomnia 12/29/2014  . Migraine without aura and without status migrainosus, not intractable  12/29/2014  . Leukocytosis 12/24/2014  . Macrocytic 12/24/2014  . Unstable angina ([place])   . Essential hypertension   . Paroxysmal atrial fibrillation (HCC)   . Diabetes mellitus with renal manifestations, uncontrolled ([place])   . History of MI (myocardial infarction) 10/30/2013  . Benign neoplasm of breast 09/12/2012  . Nocturnal hypoxemia due to obesity 09/09/2012  . COPD (chronic obstructive pulmonary disease) ([place]) 09/09/2012  . Smoking 03/27/2011  . Hyperlipidemia 10/28/2009  . CAD (coronary artery disease),  native coronary artery 10/28/2009  . Shortness of breath 10/28/2009  . Angina pectoris ([place]) 10/28/2009    Past Surgical History  Procedure Laterality Date  . Abdominal hysterectomy    . Appendectomy    . Exploratory thoracotomy    . Mole removal    . Breast surgery Right 06-09-2012    biopsy x2 9 oclock and 11 oclock  . Cardiac catheterization  2009    s/p right coronary artery drug-eluting stent  . Coronary angioplasty  2015  . Cardiac catheterization N/A 12/16/2014    Procedure: Left Heart Cath and Coronary Angiography;  Surgeon: Minna Merritts, MD;  Location: [facility];  Service: Cardiovascular;  Laterality: N/A;    Family History  Problem Relation Age of Onset  . Heart disease Father     CAD  . Breast cancer Maternal Aunt   . Coronary artery disease Mother   . Diabetes type II Mother     Social History   Social History  . Marital Status: Married    Spouse Name: N/A  . Number of Children: N/A  . Years of Education: N/A   Occupational History  . unemployed    Social History Main Topics  . Smoking status: Current Every Day Smoker -- 2.00 packs/day for 37 years    Types: Cigarettes    Start date: 02/01/1978  . Smokeless tobacco: Never Used  . Alcohol Use: No  . Drug Use: No  . Sexual Activity: Not Currently   Other Topics Concern  . Not on file   Social History Narrative   Lives in [place] with husband.  Does not work.  Does not routinely exercise.     Current outpatient prescriptions:  .  albuterol (PROAIR HFA) 108 (90 BASE) MCG/ACT inhaler, Inhale 2 puffs into the lungs every 6 (six) hours as needed., Disp: 1 Inhaler, Rfl: 0 .  ALPRAZolam (XANAX) 0.5 MG tablet, Take 1 tablet (0.5 mg total) by mouth 2 (two) times daily as needed., Disp: 60 tablet, Rfl: 2 .  aspirin 81 MG EC tablet, Take 81 mg by mouth daily. , Disp: , Rfl:  .  clopidogrel (PLAVIX) 75 MG tablet, TAKE 1 TABLET (75 MG TOTAL) BY MOUTH DAILY., Disp: 90 tablet, Rfl: 3 .  Dulaglutide  (TRULICITY) 1.5 0000000 SOPN, Inject 1.5 mg into the skin once a week., Disp: 2 mL, Rfl: 0 .  gabapentin (NEURONTIN) 100 MG capsule, Take 1-3 capsules (100-300 mg total) by mouth 3 (three) times daily., Disp: 100 capsule, Rfl: 0 .  HYDROcodone-acetaminophen (NORCO) 10-325 MG tablet, Take 1 tablet by mouth every 6 (six) hours as needed. Take 1 tablet daily as needed for headaches, Disp: 30 tablet, Rfl: 0 .  ketoconazole (NIZORAL) 2 % cream, Apply 1 application topically 2 (two) times daily. Mix with hydrocortison 10 otc ( patient will buy it otc ), Disp: 60 g, Rfl: 2 .  metFORMIN (GLUCOPHAGE XR) 500 MG 24 hr tablet, Take 1 tablet (500 mg total) by mouth daily with breakfast.,  Disp: 30 tablet, Rfl: 0 .  nebivolol (BYSTOLIC) 10 MG tablet, Take 1 tablet (10 mg total) by mouth daily., Disp: 90 tablet, Rfl: 3 .  nitroGLYCERIN (NITRO-DUR) 0.2 mg/hr patch, Place 1 patch (0.2 mg total) onto the skin daily., Disp: 30 patch, Rfl: 6 .  nitroGLYCERIN (NITROSTAT) 0.4 MG SL tablet, Place 1 tablet (0.4 mg total) under the tongue every 5 (five) minutes as needed., Disp: 25 tablet, Rfl: 6 .  Olopatadine HCl (PATADAY) 0.2 % SOLN, Apply 1 drop to eye daily., Disp: 2.5 mL, Rfl: 0 .  rosuvastatin (CRESTOR) 40 MG tablet, Take 1 tablet (40 mg total) by mouth daily., Disp: 90 tablet, Rfl: 3 .  tiZANidine (ZANAFLEX) 4 MG tablet, Take 1 tablet (4 mg total) by mouth every 8 (eight) hours as needed for muscle spasms., Disp: 90 tablet, Rfl: 0 .  umeclidinium bromide (INCRUSE ELLIPTA) 62.5 MCG/INH AEPB, Inhale 1 puff into the lungs daily., Disp: 30 each, Rfl: 0  Allergies  Allergen Reactions  . Amoxicillin-Pot Clavulanate     Vomiting   . Cefprozil     Vomiting   . Eggs Or Egg-Derived Products     Hives   . Esomeprazole Magnesium     unknown  . Montelukast Sodium     unknown  . Omeprazole     unknown  . Penicillins     Vomiting blood  . Prednisone     Rapid heart beat & difficulty breathing  . Spiriva [Tiotropium  Bromide Monohydrate]     HA  . Advair Diskus [Fluticasone-Salmeterol] Palpitations    Also happened with Symbicort     ROS  Constitutional: Negative for fever or significant weight change.  Respiratory: Positive  for cough and shortness of breath.   Cardiovascular: positive for intermittent  chest pain and  palpitations.  Gastrointestinal: Negative for abdominal pain, no bowel changes.  Musculoskeletal: Negative for gait problem or joint swelling.  Skin: Negative for rash.  Neurological: Negative for dizziness , positive for headache.  No other specific complaints in a complete review of systems (except as listed in HPI above).  Objective  Filed Vitals:   09/30/15 0944  BP: 124/76  Pulse: 78  Temp: 97.7 F (36.5 C)  TempSrc: Oral  Resp: 16  Height: 5\' 1"  (1.549 m)  Weight: 217 lb 6.4 oz (98.612 kg)  SpO2: 93%    Body mass index is 41.1 kg/(m^2).  Physical Exam  Constitutional: Patient appears well-developed . Obese No distress.  HEENT: head atraumatic, normocephalic, pupils equal and reactive to light, watery eyes, conjunctiva is intact normal pupils, some eczema around eyes neck supple, throat within normal limits Cardiovascular: Normal rate, regular rhythm and normal heart sounds. No murmur heard. No BLE edema. Pulmonary/Chest: Effort normal and breath sounds normal. No respiratory distress. Abdominal: Soft. There is no tenderness. Psychiatric: Patient has a normal mood and affect. behavior is normal. Judgment and thought content normal. Muscular Skeletal: brace on left wrist, pain during palpation of left lower back, positive right  leg raise   Recent Results (from the past 2160 hour(s))  Lipid panel     Status: Abnormal   Collection Time: 08/23/15 10:32 AM  Result Value Ref Range   Cholesterol, Total 203 (H) 100 - 199 mg/dL   Triglycerides 187 (H) 0 - 149 mg/dL   HDL 38 (L) >39 mg/dL   VLDL Cholesterol Cal 37 5 - 40 mg/dL   LDL Calculated 128 (H) 0 - 99  mg/dL   Chol/HDL Ratio 5.3 (  H) 0.0 - 4.4 ratio units    Comment:                                   T. Chol/HDL Ratio                                             Men  Women                               1/2 Avg.Risk  3.4    3.3                                   Avg.Risk  5.0    4.4                                2X Avg.Risk  9.6    7.1                                3X Avg.Risk 23.4   11.0   Comprehensive metabolic panel     Status: Abnormal   Collection Time: 08/23/15 10:32 AM  Result Value Ref Range   Glucose 162 (H) 65 - 99 mg/dL   BUN 6 6 - 24 mg/dL   Creatinine, Ser 0.76 0.57 - 1.00 mg/dL   GFR calc non Af Amer 87 >59 mL/min/1.73   GFR calc Af Amer 101 >59 mL/min/1.73   BUN/Creatinine Ratio 8 (L) 9 - 23   Sodium 144 134 - 144 mmol/L   Potassium 4.6 3.5 - 5.2 mmol/L   Chloride 99 96 - 106 mmol/L   CO2 26 18 - 29 mmol/L   Calcium 9.7 8.7 - 10.2 mg/dL   Total Protein 7.3 6.0 - 8.5 g/dL   Albumin 4.3 3.5 - 5.5 g/dL   Globulin, Total 3.0 1.5 - 4.5 g/dL   Albumin/Globulin Ratio 1.4 1.2 - 2.2   Bilirubin Total 0.5 0.0 - 1.2 mg/dL   Alkaline Phosphatase 120 (H) 39 - 117 IU/L   AST 22 0 - 40 IU/L   ALT 26 0 - 32 IU/L  Hemoglobin A1c     Status: Abnormal   Collection Time: 08/23/15 10:32 AM  Result Value Ref Range   Hgb A1c MFr Bld 9.0 (H) 4.8 - 5.6 %    Comment:          Pre-diabetes: 5.7 - 6.4          Diabetes: >6.4          Glycemic control for adults with diabetes: <7.0    Est. average glucose Bld gHb Est-mCnc 212 mg/dL  Vitamin B12     Status: None   Collection Time: 08/23/15 10:32 AM  Result Value Ref Range   Vitamin B-12 546 211 - 946 pg/mL  CBC with Differential/Platelet     Status: Abnormal   Collection Time: 08/23/15 10:32 AM  Result Value Ref Range   WBC 14.1 (H) 3.4 - 10.8 x10E3/uL   RBC 4.77 3.77 - 5.28 x10E6/uL   Hemoglobin 16.0 (H) 11.1 - 15.9  g/dL   Hematocrit 48.1 (H) 34.0 - 46.6 %   MCV 101 (H) 79 - 97 fL   MCH 33.5 (H) 26.6 - 33.0 pg   MCHC 33.3 31.5  - 35.7 g/dL   RDW 13.2 12.3 - 15.4 %   Platelets 245 150 - 379 x10E3/uL   Neutrophils 54 %   Lymphs 37 %   Monocytes 6 %   Eos 2 %   Basos 1 %   Neutrophils Absolute 7.6 (H) 1.4 - 7.0 x10E3/uL   Lymphocytes Absolute 5.2 (H) 0.7 - 3.1 x10E3/uL   Monocytes Absolute 0.9 0.1 - 0.9 x10E3/uL   EOS (ABSOLUTE) 0.3 0.0 - 0.4 x10E3/uL   Basophils Absolute 0.1 0.0 - 0.2 x10E3/uL   Immature Granulocytes 0 %   Immature Grans (Abs) 0.0 0.0 - 0.1 x10E3/uL      PHQ2/9: Depression screen [facility] 2/9 09/30/2015 08/23/2015 06/24/2015 02/23/2015 12/29/2014  Decreased Interest 0 0 0 0 0  Down, Depressed, Hopeless 0 0 1 0 0  PHQ - 2 Score 0 0 1 0 0     Fall Risk: Fall Risk  09/30/2015 08/23/2015 06/24/2015 02/23/2015 12/29/2014  Falls in the past year? Yes Yes Yes No No  Number falls in past yr: 1 1 1  - -  Injury with Fall? No No No - -     Functional Status Survey: Is the patient deaf or have difficulty hearing?: No Does the patient have difficulty seeing, even when wearing glasses/contacts?: No Does the patient have difficulty concentrating, remembering, or making decisions?: No Does the patient have difficulty walking or climbing stairs?: No Does the patient have difficulty dressing or bathing?: No Does the patient have difficulty doing errands alone such as visiting a doctor's office or shopping?: No   Assessment & Plan  1. Lumbar back pain with radiculopathy affecting right lower extremity  Needs to try gabapentin - gabapentin (NEURONTIN) 100 MG capsule; Take 1-3 capsules (100-300 mg total) by mouth 3 (three) times daily.  Dispense: 100 capsule; Refill: 0  2. Paroxysmal atrial fibrillation (HCC)  Continue plavix, and bystolic  3. OSA (obstructive sleep apnea)  We need to set her up for CPAP with oxygen  4. COPD with asthma ([place])  -Spirometry pre- FVC 67% and FEV1 60 %, ration 89% predicted, she can't tolerate long acting beta-agonist Discussed previous allergies, unable to tolerate Advair. We  will try Incruse  - umeclidinium bromide (INCRUSE ELLIPTA) 62.5 MCG/INH AEPB; Inhale 1 puff into the lungs daily.  Dispense: 30 each; Refill: 0  5. Unstable angina (HCC)  Stable  6. 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 Leukocytosis  Discussed importance of stopping smoking  8. Smoking  Needs to quit  9. Migraine without aura and without status migrainosus, not intractable  Needs to avoid strong fumes, stop smoking and caffeine, seen by neurologist in the past, unable to tolerate preventive medication, we will try gabapentin  10. Type 2 diabetes mellitus with diabetic nephropathy, without long-term current use of insulin (HCC)  Discussed possible side effects and contra-indication - Dulaglutide (TRULICITY) 1.5 0000000 SOPN; Inject 1.5 mg into the skin once a week.  Dispense: 2 mL; Refill: 0 - metFORMIN (GLUCOPHAGE XR) 500 MG 24 hr tablet; Take 1 tablet (500 mg total) by mouth daily with breakfast.  Dispense: 30 tablet; Refill: 0

## 2015-10-04 ENCOUNTER — Telehealth: Payer: Self-pay

## 2015-10-04 MED ORDER — ALBIGLUTIDE 30 MG ~~LOC~~ PEN: 30.0000 mg | PEN_INJECTOR | SUBCUTANEOUS | Status: DC

## 2015-10-04 NOTE — Telephone Encounter (Signed)
Changed medication per Dr. Ancil Boozer due to coverage.

## 2015-10-15 ENCOUNTER — Encounter: Payer: Self-pay | Admitting: Family Medicine

## 2015-10-15 DIAGNOSIS — M4800 Spinal stenosis, site unspecified: Secondary | ICD-10-CM | POA: Insufficient documentation

## 2015-11-03 ENCOUNTER — Ambulatory Visit (INDEPENDENT_AMBULATORY_CARE_PROVIDER_SITE_OTHER): Payer: Medicaid Other | Admitting: Family Medicine

## 2015-11-03 ENCOUNTER — Encounter: Payer: Self-pay | Admitting: Family Medicine

## 2015-11-03 VITALS — BP 134/78 | HR 77 | Temp 98.6°F | Resp 16 | Ht 61.0 in | Wt 214.1 lb

## 2015-11-03 DIAGNOSIS — M5417 Radiculopathy, lumbosacral region: Secondary | ICD-10-CM | POA: Diagnosis not present

## 2015-11-03 DIAGNOSIS — J449 Chronic obstructive pulmonary disease, unspecified: Secondary | ICD-10-CM | POA: Diagnosis not present

## 2015-11-03 DIAGNOSIS — E1121 Type 2 diabetes mellitus with diabetic nephropathy: Secondary | ICD-10-CM

## 2015-11-03 DIAGNOSIS — M5416 Radiculopathy, lumbar region: Secondary | ICD-10-CM

## 2015-11-03 DIAGNOSIS — J45909 Unspecified asthma, uncomplicated: Secondary | ICD-10-CM

## 2015-11-03 NOTE — Progress Notes (Signed)
Name: Barbara Bowen   MRN: [id number]    DOB: March 29, 1956   Date:11/03/2015       Progress Note  Subjective  Chief Complaint  Chief Complaint  Patient presents with  . Follow-up    patient is here for her 18-month f/u, has not picked up Gabapentin nor Incruse due to waiting on Trulicity being approved.  Marland Kitchen COPD  . Diabetes    Patient has not started Trulicity due to mother having a stroke and medication having to be approved by insurance. Patient has been stressed and taking care of sick mother and has not had time to check sugar. But will pick up new medication soon.  . Back Pain  . Orders    colonoscopy & mammogram    HPI  Right low back pain with radiculitis: she states she fell down steps 10 years ago and developed acute low back pain and radiculitis. She had some evaluation at the time, but records not available for review. She can't take prednisone. She states this episode of pain started about 3 months ago. Sudden onset - woke up with the pain. She was given rx of Gabapentin on her last visit, however had to go to [facility] to see her mother ( had another stroke ), however states is better , intermittent now and currently pain is  2/10 - during rest.  Pain is described as sharp , no numbness or tingling. Pain goes from right lower back to lateral aspect of right knee. Hydrocodone did not help with pain. She was seen by Ortho and advised to have PT, however because of insurance she can only go to [facility] and they currently close for the Summer.   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 She is currently on diet only and hgbA1C has gone from 7.0 to 9.0 on her last visit. We prescribed Trulicity and Metformin but had to go see her mother in [place] and unable to get medication filled. She is unable to exercise because of COPD/CAD. She denies polyphagia, polyuria but she has polydipsia . Explained importance of starting ACE/ARB but she refuses it, she states she will  consider it on her next visit.  COPD/Asthma : allergic to Spiriva, uses nocturnal oxygen, still smoking, has daily symptoms. She refuses taking any medication. Symbicort and Advair caused palpitation, Spiriva caused migraine , we sent rx for Breo on her last visit but out of town , will pick it up today.    Patient Active Problem List   Diagnosis Date Noted  . Spinal stenosis 10/15/2015  . Bilateral tinnitus 08/23/2015  . B12 deficiency 03/31/2015  . OSA (obstructive sleep apnea) 03/31/2015  . 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 Intertrigo 02/23/2015  . Coronary artery disease   . Insomnia 12/29/2014  . Migraine without aura and without status migrainosus, not intractable 12/29/2014  . Leukocytosis 12/24/2014  . Macrocytic 12/24/2014  . Unstable angina ([place])   . Essential hypertension   . Paroxysmal atrial fibrillation ([facility])   . Diabetes mellitus with renal manifestations, uncontrolled ([place])   . History of MI (myocardial infarction) 10/30/2013  . Benign neoplasm of breast 09/12/2012  . Nocturnal hypoxemia due to obesity 09/09/2012  . COPD (chronic obstructive pulmonary disease) ([place]) 09/09/2012  . Smoking 03/27/2011  . Hyperlipidemia 10/28/2009  . CAD (coronary artery disease), native coronary artery 10/28/2009  . Shortness of breath 10/28/2009  . Angina pectoris ([place]) 10/28/2009    Past Surgical History  Procedure  Laterality Date  . Abdominal hysterectomy    . Appendectomy    . Exploratory thoracotomy    . Mole removal    . Breast surgery Right 06-09-2012    biopsy x2 9 oclock and 11 oclock  . Cardiac catheterization  2009    s/p right coronary artery drug-eluting stent  . Coronary angioplasty  2015  . Cardiac catheterization N/A 12/16/2014    Procedure: Left Heart Cath and Coronary Angiography;  Surgeon: Minna Merritts, MD;  Location: [facility];  Service: Cardiovascular;  Laterality: N/A;    Family History   Problem Relation Age of Onset  . Heart disease Father     CAD  . Breast cancer Maternal Aunt   . Coronary artery disease Mother   . Diabetes type II Mother     Social History   Social History  . Marital Status: Married    Spouse Name: N/A  . Number of Children: N/A  . Years of Education: N/A   Occupational History  . unemployed    Social History Main Topics  . Smoking status: Current Every Day Smoker -- 2.00 packs/day for 37 years    Types: Cigarettes    Start date: 02/01/1978  . Smokeless tobacco: Never Used  . Alcohol Use: No  . Drug Use: No  . Sexual Activity: Not Currently   Other Topics Concern  . Not on file   Social History Narrative   Lives in [place] with husband.  Does not work.  Does not routinely exercise.     Current outpatient prescriptions:  .  Albiglutide 30 MG PEN, Inject 30 mg into the skin once a week., Disp: 4 each, Rfl: 0 .  albuterol (PROAIR HFA) 108 (90 BASE) MCG/ACT inhaler, Inhale 2 puffs into the lungs every 6 (six) hours as needed., Disp: 1 Inhaler, Rfl: 0 .  ALPRAZolam (XANAX) 0.5 MG tablet, Take 1 tablet (0.5 mg total) by mouth 2 (two) times daily as needed., Disp: 60 tablet, Rfl: 2 .  aspirin 81 MG EC tablet, Take 81 mg by mouth daily. , Disp: , Rfl:  .  clopidogrel (PLAVIX) 75 MG tablet, TAKE 1 TABLET (75 MG TOTAL) BY MOUTH DAILY., Disp: 90 tablet, Rfl: 3 .  gabapentin (NEURONTIN) 100 MG capsule, Take 1-3 capsules (100-300 mg total) by mouth 3 (three) times daily., Disp: 100 capsule, Rfl: 0 .  HYDROcodone-acetaminophen (NORCO) 10-325 MG tablet, Take 1 tablet by mouth every 6 (six) hours as needed. Take 1 tablet daily as needed for headaches, Disp: 30 tablet, Rfl: 0 .  ketoconazole (NIZORAL) 2 % cream, Apply 1 application topically 2 (two) times daily. Mix with hydrocortison 10 otc ( patient will buy it otc ), Disp: 60 g, Rfl: 2 .  metFORMIN (GLUCOPHAGE XR) 500 MG 24 hr tablet, Take 1 tablet (500 mg total) by mouth daily with breakfast.,  Disp: 30 tablet, Rfl: 0 .  nebivolol (BYSTOLIC) 10 MG tablet, Take 1 tablet (10 mg total) by mouth daily., Disp: 90 tablet, Rfl: 3 .  nitroGLYCERIN (NITRO-DUR) 0.2 mg/hr patch, Place 1 patch (0.2 mg total) onto the skin daily., Disp: 30 patch, Rfl: 6 .  nitroGLYCERIN (NITROSTAT) 0.4 MG SL tablet, Place 1 tablet (0.4 mg total) under the tongue every 5 (five) minutes as needed., Disp: 25 tablet, Rfl: 6 .  Olopatadine HCl (PATADAY) 0.2 % SOLN, Apply 1 drop to eye daily., Disp: 2.5 mL, Rfl: 0 .  rosuvastatin (CRESTOR) 40 MG tablet, Take 1 tablet (40 mg total)  by mouth daily., Disp: 90 tablet, Rfl: 3 .  tiZANidine (ZANAFLEX) 4 MG tablet, Take 1 tablet (4 mg total) by mouth every 8 (eight) hours as needed for muscle spasms., Disp: 90 tablet, Rfl: 0  Allergies  Allergen Reactions  . Amoxicillin-Pot Clavulanate     Vomiting   . Cefprozil     Vomiting   . Eggs Or Egg-Derived Products     Hives   . Esomeprazole Magnesium     unknown  . Montelukast Sodium     unknown  . Omeprazole     unknown  . Penicillins     Vomiting blood  . Prednisone     Rapid heart beat & difficulty breathing  . Spiriva [Tiotropium Bromide Monohydrate]     HA  . Advair Diskus [Fluticasone-Salmeterol] Palpitations    Also happened with Symbicort     ROS  Ten systems reviewed and is negative except as mentioned in HPI   Objective  Filed Vitals:   11/03/15 1000  BP: 134/78  Pulse: 77  Temp: 98.6 F (37 C)  TempSrc: Oral  Resp: 16  Height: 5\' 1"  (1.549 m)  Weight: 214 lb 1.6 oz (97.115 kg)  SpO2: 94%    Body mass index is 40.47 kg/(m^2).  Physical Exam  Constitutional: Patient appears well-developed and well-nourished. Obese  No distress.  HEENT: head atraumatic, normocephalic, pupils equal and reactive to light,  neck supple, throat within normal limits Cardiovascular: Normal rate, regular rhythm and normal heart sounds.  No murmur heard. Trace  BLE edema. Pulmonary/Chest: Effort normal and  breath sounds normal. No respiratory distress. Abdominal: Soft.  There is no tenderness. Psychiatric: Patient has a normal mood and affect. behavior is normal. Judgment and thought content normal.  Recent Results (from the past 2160 hour(s))  Lipid panel     Status: Abnormal   Collection Time: 08/23/15 10:32 AM  Result Value Ref Range   Cholesterol, Total 203 (H) 100 - 199 mg/dL   Triglycerides 187 (H) 0 - 149 mg/dL   HDL 38 (L) >39 mg/dL   VLDL Cholesterol Cal 37 5 - 40 mg/dL   LDL Calculated 128 (H) 0 - 99 mg/dL   Chol/HDL Ratio 5.3 (H) 0.0 - 4.4 ratio units    Comment:                                   T. Chol/HDL Ratio                                             Men  Women                               1/2 Avg.Risk  3.4    3.3                                   Avg.Risk  5.0    4.4                                2X Avg.Risk  9.6    7.1  3X Avg.Risk 23.4   11.0   Comprehensive metabolic panel     Status: Abnormal   Collection Time: 08/23/15 10:32 AM  Result Value Ref Range   Glucose 162 (H) 65 - 99 mg/dL   BUN 6 6 - 24 mg/dL   Creatinine, Ser 0.76 0.57 - 1.00 mg/dL   GFR calc non Af Amer 87 >59 mL/min/1.73   GFR calc Af Amer 101 >59 mL/min/1.73   BUN/Creatinine Ratio 8 (L) 9 - 23   Sodium 144 134 - 144 mmol/L   Potassium 4.6 3.5 - 5.2 mmol/L   Chloride 99 96 - 106 mmol/L   CO2 26 18 - 29 mmol/L   Calcium 9.7 8.7 - 10.2 mg/dL   Total Protein 7.3 6.0 - 8.5 g/dL   Albumin 4.3 3.5 - 5.5 g/dL   Globulin, Total 3.0 1.5 - 4.5 g/dL   Albumin/Globulin Ratio 1.4 1.2 - 2.2   Bilirubin Total 0.5 0.0 - 1.2 mg/dL   Alkaline Phosphatase 120 (H) 39 - 117 IU/L   AST 22 0 - 40 IU/L   ALT 26 0 - 32 IU/L  Hemoglobin A1c     Status: Abnormal   Collection Time: 08/23/15 10:32 AM  Result Value Ref Range   Hgb A1c MFr Bld 9.0 (H) 4.8 - 5.6 %    Comment:          Pre-diabetes: 5.7 - 6.4          Diabetes: >6.4          Glycemic control for adults with diabetes:  <7.0    Est. average glucose Bld gHb Est-mCnc 212 mg/dL  Vitamin B12     Status: None   Collection Time: 08/23/15 10:32 AM  Result Value Ref Range   Vitamin B-12 546 211 - 946 pg/mL  CBC with Differential/Platelet     Status: Abnormal   Collection Time: 08/23/15 10:32 AM  Result Value Ref Range   WBC 14.1 (H) 3.4 - 10.8 x10E3/uL   RBC 4.77 3.77 - 5.28 x10E6/uL   Hemoglobin 16.0 (H) 11.1 - 15.9 g/dL   Hematocrit 48.1 (H) 34.0 - 46.6 %   MCV 101 (H) 79 - 97 fL   MCH 33.5 (H) 26.6 - 33.0 pg   MCHC 33.3 31.5 - 35.7 g/dL   RDW 13.2 12.3 - 15.4 %   Platelets 245 150 - 379 x10E3/uL   Neutrophils 54 %   Lymphs 37 %   Monocytes 6 %   Eos 2 %   Basos 1 %   Neutrophils Absolute 7.6 (H) 1.4 - 7.0 x10E3/uL   Lymphocytes Absolute 5.2 (H) 0.7 - 3.1 x10E3/uL   Monocytes Absolute 0.9 0.1 - 0.9 x10E3/uL   EOS (ABSOLUTE) 0.3 0.0 - 0.4 x10E3/uL   Basophils Absolute 0.1 0.0 - 0.2 x10E3/uL   Immature Granulocytes 0 %   Immature Grans (Abs) 0.0 0.0 - 0.1 x10E3/uL    PHQ2/9: Depression screen [facility] 2/9 11/03/2015 09/30/2015 08/23/2015 06/24/2015 02/23/2015  Decreased Interest 0 0 0 0 0  Down, Depressed, Hopeless 0 0 0 1 0  PHQ - 2 Score 0 0 0 1 0     Fall Risk: Fall Risk  11/03/2015 09/30/2015 08/23/2015 06/24/2015 02/23/2015  Falls in the past year? Yes Yes Yes Yes No  Number falls in past yr: 1 1 1 1  -  Injury with Fall? No No No No -     Functional Status Survey: Is the patient deaf or have difficulty hearing?: No Does  the patient have difficulty seeing, even when wearing glasses/contacts?: No Does the patient have difficulty concentrating, remembering, or making decisions?: No Does the patient have difficulty walking or climbing stairs?: No Does the patient have difficulty dressing or bathing?: No Does the patient have difficulty doing errands alone such as visiting a doctor's office or shopping?: No    Assessment & Plan  1. Lumbar back pain with radiculopathy affecting right lower  extremity  Doing better, continue follow up with Ortho  2. COPD with asthma [facility])  She will get Breo from the pharmacy today  3. Type 2 diabetes mellitus with diabetic nephropathy, without long-term current use of insulin ([place])  She will start medication today, we will give her one dose of Trulicity in our office today

## 2015-11-23 ENCOUNTER — Other Ambulatory Visit: Payer: Self-pay

## 2015-11-23 ENCOUNTER — Telehealth: Payer: Self-pay | Admitting: Family Medicine

## 2015-11-23 ENCOUNTER — Other Ambulatory Visit: Payer: Self-pay | Admitting: Family Medicine

## 2015-11-23 DIAGNOSIS — M5416 Radiculopathy, lumbar region: Secondary | ICD-10-CM

## 2015-11-23 DIAGNOSIS — E1121 Type 2 diabetes mellitus with diabetic nephropathy: Secondary | ICD-10-CM

## 2015-11-23 MED ORDER — CLOPIDOGREL BISULFATE 75 MG PO TABS
ORAL_TABLET | ORAL | 3 refills | Status: DC
Start: 2015-11-23 — End: 2016-01-27

## 2015-11-23 MED ORDER — GABAPENTIN 100 MG PO CAPS: 100.0000 mg | ORAL_CAPSULE | Freq: Three times a day (TID) | ORAL | 0 refills | Status: DC

## 2015-11-23 MED ORDER — NEBIVOLOL HCL 10 MG PO TABS
10.0000 mg | ORAL_TABLET | Freq: Every day | ORAL | 3 refills | Status: DC
Start: 2015-11-23 — End: 2016-01-27

## 2015-11-23 MED ORDER — METFORMIN HCL ER 500 MG PO TB24: 500.0000 mg | ORAL_TABLET | Freq: Every day | ORAL | 0 refills | Status: DC

## 2015-11-23 NOTE — Telephone Encounter (Signed)
Sent metformin and gabapentin, I can't fill controlled medications until her follow up

## 2015-12-01 ENCOUNTER — Ambulatory Visit: Payer: Medicaid Other | Admitting: Family Medicine

## 2015-12-28 ENCOUNTER — Ambulatory Visit (INDEPENDENT_AMBULATORY_CARE_PROVIDER_SITE_OTHER): Payer: Medicaid Other | Admitting: Family Medicine

## 2015-12-28 ENCOUNTER — Encounter: Payer: Self-pay | Admitting: Family Medicine

## 2015-12-28 VITALS — BP 134/68 | HR 90 | Temp 98.3°F | Resp 20 | Ht 61.0 in | Wt 210.7 lb

## 2015-12-28 DIAGNOSIS — M5416 Radiculopathy, lumbar region: Secondary | ICD-10-CM

## 2015-12-28 DIAGNOSIS — J449 Chronic obstructive pulmonary disease, unspecified: Secondary | ICD-10-CM

## 2015-12-28 DIAGNOSIS — Z1211 Encounter for screening for malignant neoplasm of colon: Secondary | ICD-10-CM | POA: Diagnosis not present

## 2015-12-28 DIAGNOSIS — G4733 Obstructive sleep apnea (adult) (pediatric): Secondary | ICD-10-CM | POA: Diagnosis not present

## 2015-12-28 DIAGNOSIS — Z23 Encounter for immunization: Secondary | ICD-10-CM | POA: Diagnosis not present

## 2015-12-28 DIAGNOSIS — I200 Unstable angina: Secondary | ICD-10-CM | POA: Diagnosis not present

## 2015-12-28 DIAGNOSIS — G4700 Insomnia, unspecified: Secondary | ICD-10-CM

## 2015-12-28 DIAGNOSIS — F17200 Nicotine dependence, unspecified, uncomplicated: Secondary | ICD-10-CM

## 2015-12-28 DIAGNOSIS — G4736 Sleep related hypoventilation in conditions classified elsewhere: Secondary | ICD-10-CM

## 2015-12-28 DIAGNOSIS — J45909 Unspecified asthma, uncomplicated: Secondary | ICD-10-CM

## 2015-12-28 DIAGNOSIS — E1159 Type 2 diabetes mellitus with other circulatory complications: Secondary | ICD-10-CM | POA: Diagnosis not present

## 2015-12-28 DIAGNOSIS — E1165 Type 2 diabetes mellitus with hyperglycemia: Secondary | ICD-10-CM

## 2015-12-28 DIAGNOSIS — B351 Tinea unguium: Secondary | ICD-10-CM

## 2015-12-28 DIAGNOSIS — G43009 Migraine without aura, not intractable, without status migrainosus: Secondary | ICD-10-CM | POA: Diagnosis not present

## 2015-12-28 DIAGNOSIS — M5417 Radiculopathy, lumbosacral region: Secondary | ICD-10-CM

## 2015-12-28 DIAGNOSIS — Z720 Tobacco use: Secondary | ICD-10-CM

## 2015-12-28 DIAGNOSIS — E1121 Type 2 diabetes mellitus with diabetic nephropathy: Secondary | ICD-10-CM

## 2015-12-28 DIAGNOSIS — I10 Essential (primary) hypertension: Secondary | ICD-10-CM | POA: Diagnosis not present

## 2015-12-28 DIAGNOSIS — B353 Tinea pedis: Secondary | ICD-10-CM

## 2015-12-28 LAB — POCT UA - MICROALBUMIN: Microalbumin Ur, POC: 50 mg/L

## 2015-12-28 LAB — POCT GLYCOSYLATED HEMOGLOBIN (HGB A1C): Hemoglobin A1C: 8.3

## 2015-12-28 MED ORDER — LOSARTAN POTASSIUM 50 MG PO TABS: 50.0000 mg | ORAL_TABLET | Freq: Every day | ORAL | 0 refills | Status: DC

## 2015-12-28 MED ORDER — ALBUTEROL SULFATE HFA 108 (90 BASE) MCG/ACT IN AERS: 2.0000 | INHALATION_SPRAY | Freq: Four times a day (QID) | RESPIRATORY_TRACT | 0 refills | Status: DC | PRN

## 2015-12-28 MED ORDER — DULAGLUTIDE 1.5 MG/0.5ML ~~LOC~~ SOAJ: 1.5000 mg | Freq: Every day | SUBCUTANEOUS | 2 refills | Status: DC

## 2015-12-28 MED ORDER — HYDROCODONE-ACETAMINOPHEN 10-325 MG PO TABS: 1.0000 | ORAL_TABLET | Freq: Four times a day (QID) | ORAL | 0 refills | Status: DC | PRN

## 2015-12-28 MED ORDER — ALPRAZOLAM 0.5 MG PO TABS: 0.5000 mg | ORAL_TABLET | Freq: Two times a day (BID) | ORAL | 2 refills | Status: DC | PRN

## 2015-12-28 MED ORDER — METFORMIN HCL ER 500 MG PO TB24: 500.0000 mg | ORAL_TABLET | Freq: Every day | ORAL | 1 refills | Status: DC

## 2015-12-28 NOTE — Progress Notes (Signed)
Name: Barbara Bowen   MRN: [id number]    DOB: February 19, 1956   Date:12/28/2015       Progress Note  Subjective  Chief Complaint  Chief Complaint  Patient presents with  . Medication Refill  . Diabetes    Patient does not check sugar at home   . COPD    Wheezing, cough and sob. Uses ProAir when needed  . Back Pain    Unchanged  . 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 . She is  unable to exercise because of COPD/CAD. She denies polyphagia, polyuria but she has polydipsia.  CAD: last cath done by Dr. Rockey Situ was in 2016 , stents still open, she is on medication managements at this time. She states she has not been using the patch daily, only using prn, last episode of chest pain was a few days ago. She states it is usually  a stabbing pain, it lasted a few seconds.   HTN: taking betablocker, and willing to start Losartan today . Intermittent chest pain but palpitation is controlled with Bystolic  Migraine headaches: episodes were not as frequent while at mother's house, but since back last week episodes daily . She states improves when she takes xanax and goes to sleep.  Seen by neurologist but per patient can't tolerate any prophylactic medication, the only thing that works for her is hydrodocone prn, 30 pills to last 90 days. She tried Neurontin on her last visit but stopped on her own, states made her feel groggy    Insomnia: she denies depression or anxiety, takes alprazolam for sleep, it does not last all night, we tried her on Seroquel but she states it caused her nightmares. She also afraid of other medications for sleep. She also takes it to nap during the day  Hyperlipidemia: taking crestor daily now, LDL was not at goal on her last  labs  COPD/Asthma : allergic to Spiriva, uses nocturnal oxygen, still smoking, has daily symptoms ( SOB and cough) . She refuses taking any medication. Symbicort and Advair caused palpitation, Spiriva caused migraine, she is only on Proair prn  OSA: had a CPAP machine at home, but had problems getting oxygen with machine so they took it away, we need to have the same company supplying oxygen and CPAP supplies.  Patient states nobody has called [employer] yet  Right low back pain with radiculitis: she states she fell down steps 10 years ago and developed acute low back pain and radiculitis. She had some imaging at the time, but not sure of what type. She can't take prednisone. She states this episode of pain started about two months ago. Sudden onset - woke up with the pain. Constant pain, at this time 8/10 - during rest. It goes up to 8-9/10 at times. Pain is described as sharp , no numbness or tingling. Pain goes from right lower back to lateral aspect of right knee. Hydrocodone did not help with pain. She was given Gabapentin in July but did not like it because it caused sedation, so she stopped medication on her own   Afib: denies palpitation , she has intermittent chest pain   Patient Active Problem List   Diagnosis Date Noted  .  Spinal stenosis 10/15/2015  . Bilateral tinnitus 08/23/2015  . B12 deficiency 03/31/2015  . OSA (obstructive sleep apnea) 03/31/2015  . 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 Intertrigo 02/23/2015  . Coronary artery disease   . Insomnia 12/29/2014  . Migraine without aura and without status migrainosus, not intractable 12/29/2014  . Leukocytosis 12/24/2014  . Macrocytic 12/24/2014  . Unstable angina ([place])   . Essential hypertension   . Paroxysmal atrial fibrillation (HCC)   . Diabetes mellitus with renal manifestations, uncontrolled ([place])   . History of MI (myocardial infarction) 10/30/2013  . Benign neoplasm of breast  09/12/2012  . Nocturnal hypoxemia due to obesity 09/09/2012  . COPD (chronic obstructive pulmonary disease) ([place]) 09/09/2012  . Smoking 03/27/2011  . Hyperlipidemia 10/28/2009  . CAD (coronary artery disease), native coronary artery 10/28/2009  . Shortness of breath 10/28/2009  . Angina pectoris ([place]) 10/28/2009    Past Surgical History:  Procedure Laterality Date  . ABDOMINAL HYSTERECTOMY    . APPENDECTOMY    . BREAST SURGERY Right 06-09-2012   biopsy x2 9 oclock and 11 oclock  . CARDIAC CATHETERIZATION  2009   s/p right coronary artery drug-eluting stent  . CARDIAC CATHETERIZATION N/A 12/16/2014   Procedure: Left Heart Cath and Coronary Angiography;  Surgeon: Minna Merritts, MD;  Location: [facility];  Service: Cardiovascular;  Laterality: N/A;  . CORONARY ANGIOPLASTY  2015  . exploratory thoracotomy    . MOLE REMOVAL      Family History  Problem Relation Age of Onset  . Heart disease Father     CAD  . Breast cancer Maternal Aunt   . Coronary artery disease Mother   . Diabetes type II Mother     Social History   Social History  . Marital status: Married    Spouse name: N/A  . Number of children: N/A  . Years of education: N/A   Occupational History  . unemployed [employer]   Social History Main Topics  . Smoking status: Current Every Day Smoker    Packs/day: 2.00    Years: 37.00    Types: Cigarettes    Start date: 02/01/1978  . Smokeless tobacco: Never Used  . Alcohol use No  . Drug use: No  . Sexual activity: Not Currently   Other Topics Concern  . Not on file   Social History Narrative   Lives in [place] with husband.  Does not work.  Does not routinely exercise.     Current Outpatient Prescriptions:  .  Albiglutide 30 MG PEN, Inject 30 mg into the skin once a week., Disp: 4 each, Rfl: 0 .  albuterol (PROAIR HFA) 108 (90 Base) MCG/ACT inhaler, Inhale 2 puffs into the lungs every 6 (six) hours as needed., Disp: 1 Inhaler, Rfl: 0 .   ALPRAZolam (XANAX) 0.5 MG tablet, Take 1 tablet (0.5 mg total) by mouth 2 (two) times daily as needed., Disp: 60 tablet, Rfl: 2 .  aspirin 81 MG EC tablet, Take 81 mg by mouth daily. , Disp: , Rfl:  .  clopidogrel (PLAVIX) 75 MG tablet, TAKE 1 TABLET (75 MG TOTAL) BY MOUTH DAILY., Disp: 90 tablet, Rfl: 3 .  HYDROcodone-acetaminophen (NORCO) 10-325 MG tablet, Take 1 tablet by mouth every 6 (six) hours as needed. Take 1 tablet daily as needed for headaches, Disp: 30 tablet, Rfl: 0 .  ketoconazole (NIZORAL) 2 % cream, Apply 1 application topically 2 (two) times daily. Mix with hydrocortison 10 otc (  patient will buy it otc ), Disp: 60 g, Rfl: 2 .  metFORMIN (GLUCOPHAGE XR) 500 MG 24 hr tablet, Take 1 tablet (500 mg total) by mouth daily with breakfast., Disp: 90 tablet, Rfl: 1 .  nebivolol (BYSTOLIC) 10 MG tablet, Take 1 tablet (10 mg total) by mouth daily., Disp: 90 tablet, Rfl: 3 .  nitroGLYCERIN (NITRO-DUR) 0.2 mg/hr patch, Place 1 patch (0.2 mg total) onto the skin daily., Disp: 30 patch, Rfl: 6 .  nitroGLYCERIN (NITROSTAT) 0.4 MG SL tablet, Place 1 tablet (0.4 mg total) under the tongue every 5 (five) minutes as needed., Disp: 25 tablet, Rfl: 6 .  rosuvastatin (CRESTOR) 40 MG tablet, Take 1 tablet (40 mg total) by mouth daily., Disp: 90 tablet, Rfl: 3 .  Dulaglutide (TRULICITY) 1.5 0000000 SOPN, Inject 1.5 mg into the skin daily., Disp: 4 pen, Rfl: 2 .  losartan (COZAAR) 50 MG tablet, Take 1 tablet (50 mg total) by mouth daily., Disp: 90 tablet, Rfl: 0  Allergies  Allergen Reactions  . Amoxicillin-Pot Clavulanate     Vomiting   . Cefprozil     Vomiting   . Eggs Or Egg-Derived Products     Hives   . Esomeprazole Magnesium     unknown  . Montelukast Sodium     unknown  . Omeprazole     unknown  . Penicillins     Vomiting blood  . Prednisone     Rapid heart beat & difficulty breathing  . Spiriva [Tiotropium Bromide Monohydrate]     HA  . Advair Diskus [Fluticasone-Salmeterol]  Palpitations    Also happened with Symbicort     ROS  Ten systems reviewed and is negative except as mentioned in HPI   Objective  Vitals:   12/28/15 1347  BP: 134/68  Pulse: 90  Resp: 20  Temp: 98.3 F (36.8 C)  TempSrc: Oral  SpO2: 95%  Weight: 210 lb 11.8 oz (95.6 kg)  Height: 5\' 1"  (1.549 m)    Body mass index is 39.82 kg/m.  Physical Exam  Constitutional: Patient appears well-developed and well-nourished. Obese No distress.  HEENT: head atraumatic, normocephalic, pupils equal and reactive to light, neck supple, throat within normal limits Cardiovascular: Normal rate, regular rhythm and normal heart sounds.  No murmur heard. No BLE edema. Pulmonary/Chest: Effort normal and breath sounds normal. No respiratory distress. Abdominal: Soft.  There is no tenderness. Psychiatric: Patient has a normal mood and affect. behavior is normal. Judgment and thought content normal.  Recent Results (from the past 2160 hour(s))  POCT HgB A1C     Status: Abnormal   Collection Time: 12/28/15  1:55 PM  Result Value Ref Range   Hemoglobin A1C 8.3   POCT UA - Microalbumin     Status: Abnormal   Collection Time: 12/28/15  1:55 PM  Result Value Ref Range   Microalbumin Ur, POC 50 mg/L   Creatinine, POC  mg/dL   Albumin/Creatinine Ratio, Urine, POC      Diabetic Foot Exam: Diabetic Foot Exam - Simple   Simple Foot Form Diabetic Foot exam was performed with the following findings:  Yes 12/28/2015  2:20 PM  Visual Inspection See comments:  Yes Sensation Testing Intact to touch and monofilament testing bilaterally:  Yes Pulse Check Posterior Tibialis and Dorsalis pulse intact bilaterally:  Yes Comments Thick toenails with maceration and also athlete's feet      PHQ2/9: Depression screen [facility] 2/9 11/03/2015 09/30/2015 08/23/2015 06/24/2015 02/23/2015  Decreased Interest 0 0 0 0  0  Down, Depressed, Hopeless 0 0 0 1 0  PHQ - 2 Score 0 0 0 1 0     Fall Risk: Fall Risk  11/03/2015  09/30/2015 08/23/2015 06/24/2015 02/23/2015  Falls in the past year? Yes Yes Yes Yes No  Number falls in past yr: 1 1 1 1  -  Injury with Fall? No No No No -     Assessment & Plan  1. Uncontrolled type 2 diabetes mellitus with diabetic nephropathy, unspecified long term insulin use status (HCC)  Resume medications - POCT HgB A1C - POCT UA - Microalbumin - metFORMIN (GLUCOPHAGE XR) 500 MG 24 hr tablet; Take 1 tablet (500 mg total) by mouth daily with breakfast.  Dispense: 90 tablet; Refill: 1 - Dulaglutide (TRULICITY) 1.5 0000000 SOPN; Inject 1.5 mg into the skin daily.  Dispense: 4 pen; Refill: 2 - losartan (COZAAR) 50 MG tablet; Take 1 tablet (50 mg total) by mouth daily.  Dispense: 90 tablet; Refill: 0  2. COPD with asthma ([place])  Unable to tolerate anything but albuterol   3. 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 Smoking  Needs to quit  5. OSA (obstructive sleep apnea)  We need to have CPAP with oxygen  6. Unstable angina (HCC)  Sees Dr. Hedy Camara  7. Migraine without aura and without status migrainosus, not intractable  - HYDROcodone-acetaminophen (NORCO) 10-325 MG tablet; Take 1 tablet by mouth every 6 (six) hours as needed. Take 1 tablet daily as needed for headaches  Dispense: 30 tablet; Refill: 0  8. Type 2 diabetes mellitus with other circulatory complications (HCC)  - metFORMIN (GLUCOPHAGE XR) 500 MG 24 hr tablet; Take 1 tablet (500 mg total) by mouth daily with breakfast.  Dispense: 90 tablet; Refill: 1 - Dulaglutide (TRULICITY) 1.5 0000000 SOPN; Inject 1.5 mg into the skin daily.  Dispense: 4 pen; Refill: 2  9. Type 2 diabetes mellitus with diabetic nephropathy, without long-term current use of insulin (HCC)  - metFORMIN (GLUCOPHAGE XR) 500 MG 24 hr tablet; Take 1 tablet (500 mg total) by mouth daily with breakfast.  Dispense: 90 tablet; Refill: 1 - Dulaglutide (TRULICITY) 1.5 0000000 SOPN; Inject 1.5 mg into  the skin daily.  Dispense: 4 pen; Refill: 2  10. Essential hypertension  We will add Cozaar for kidney protection   11. Chronic obstructive pulmonary disease, unspecified COPD type (HCC)  - albuterol (PROAIR HFA) 108 (90 Base) MCG/ACT inhaler; Inhale 2 puffs into the lungs every 6 (six) hours as needed.  Dispense: 1 Inhaler; Refill: 0  12. Insomnia  - ALPRAZolam (XANAX) 0.5 MG tablet; Take 1 tablet (0.5 mg total) by mouth 2 (two) times daily as needed.  Dispense: 60 tablet; Refill: 2  13. Lumbar back pain with radiculopathy affecting right lower extremity  stable  14. Colon cancer screening  - POC Hemoccult Bld/Stl (3-Cd Home Screen); Future  15. Needs flu shot  - Flu Vaccine QUAD 36+ mos IM  16. Need for pneumococcal vaccination  - Pneumococcal conjugate vaccine 13-valent IM   17. Onychomycosis  She refuses therapy at this time  18. Tinea pedis of both feet  Refusing therapy at this time

## 2015-12-29 ENCOUNTER — Telehealth: Payer: Self-pay

## 2015-12-29 NOTE — Telephone Encounter (Signed)
Spoke with Dealer at [facility] in [place] about patient CPAP machine and oxygen, since we have faxed the order back in March 2017 and in 2016. But patient states she still has not received the machine. [place] manager informed me we have to have a face to face explaining why patient turned in her CPAP machine in March 2017 to [facility]. Also a letter explaining why she has been non-compliance with her machine. I faxed a new referral to [facility] due to patient has to have a yearly PSG done due to her insurance. Patient has been notified she will have to complete a new sleep study and once complete we can fax it to [facility] along with the compliance note.

## 2015-12-30 ENCOUNTER — Telehealth: Payer: Self-pay

## 2015-12-30 NOTE — Telephone Encounter (Signed)
Pharmacist from [facility] called and states they need clarification on the Hydrocodone written as :Take 1 tablet by mouth every 6 (six) hours as needed. Take 1 tablet daily as needed for headaches. Pharmacist wants to know before filling it is once every 6 hours or one daily prn? I informed pharmacist that the prescription was written in this same direction since 2016 but wants to wait to see how to fill it. I told pharmacist Dr. Ancil Boozer would be back Monday and we would call once clarified with exactly how Dr. Ancil Boozer intents patient to take medication.

## 2016-01-01 NOTE — Telephone Encounter (Signed)
It is for prn headaches

## 2016-01-02 ENCOUNTER — Telehealth: Payer: Self-pay

## 2016-01-02 ENCOUNTER — Other Ambulatory Visit: Payer: Self-pay | Admitting: Family Medicine

## 2016-01-02 ENCOUNTER — Encounter: Payer: Self-pay | Admitting: Family Medicine

## 2016-01-02 MED ORDER — ALBIGLUTIDE 30 MG ~~LOC~~ PEN: 30.0000 mg | PEN_INJECTOR | SUBCUTANEOUS | 2 refills | Status: DC

## 2016-01-02 NOTE — Telephone Encounter (Signed)
Tanzeum is not insulin, it is in the same class as Trulicity and she may not have a reaction to it. I can send her to Endocrinologist if she prefers

## 2016-01-02 NOTE — Telephone Encounter (Signed)
Called patient to inform her Medicaid no longer covers Truliclity, and informed patient Dr. Ancil Boozer switched medication to Tanzeum instead due to coverage. Patient states she will not take any insulin and ask you switch this medication. Also patient states injection site has a red knot and she had this same reaction before. Patient has been putting ice on site and taking Ibuprofen. Please advise.

## 2016-01-03 ENCOUNTER — Telehealth: Payer: Self-pay | Admitting: Family Medicine

## 2016-01-03 NOTE — Telephone Encounter (Signed)
Patient notified and was told to call [facility] to set up her sleep study at [phone].

## 2016-01-03 NOTE — Telephone Encounter (Signed)
Thank you for the update!

## 2016-01-10 ENCOUNTER — Other Ambulatory Visit: Payer: Self-pay | Admitting: Family Medicine

## 2016-01-10 MED ORDER — VALSARTAN 80 MG PO TABS: 80.0000 mg | ORAL_TABLET | Freq: Every day | ORAL | 2 refills | Status: DC

## 2016-01-27 ENCOUNTER — Encounter: Payer: Self-pay | Admitting: Cardiovascular Disease

## 2016-01-27 ENCOUNTER — Ambulatory Visit (INDEPENDENT_AMBULATORY_CARE_PROVIDER_SITE_OTHER): Payer: Medicaid Other | Admitting: Cardiovascular Disease

## 2016-01-27 ENCOUNTER — Telehealth: Payer: Self-pay | Admitting: *Deleted

## 2016-01-27 VITALS — BP 120/70 | HR 65 | Ht 60.5 in | Wt 209.5 lb

## 2016-01-27 DIAGNOSIS — I209 Angina pectoris, unspecified: Secondary | ICD-10-CM | POA: Diagnosis not present

## 2016-01-27 DIAGNOSIS — E1165 Type 2 diabetes mellitus with hyperglycemia: Secondary | ICD-10-CM

## 2016-01-27 DIAGNOSIS — I10 Essential (primary) hypertension: Secondary | ICD-10-CM

## 2016-01-27 DIAGNOSIS — I25110 Atherosclerotic heart disease of native coronary artery with unstable angina pectoris: Secondary | ICD-10-CM

## 2016-01-27 DIAGNOSIS — J449 Chronic obstructive pulmonary disease, unspecified: Secondary | ICD-10-CM

## 2016-01-27 DIAGNOSIS — E7800 Pure hypercholesterolemia, unspecified: Secondary | ICD-10-CM

## 2016-01-27 DIAGNOSIS — E1121 Type 2 diabetes mellitus with diabetic nephropathy: Secondary | ICD-10-CM

## 2016-01-27 MED ORDER — VALSARTAN 80 MG PO TABS: 80.0000 mg | ORAL_TABLET | Freq: Every day | ORAL | 3 refills | Status: DC

## 2016-01-27 MED ORDER — EZETIMIBE 10 MG PO TABS: 10.0000 mg | ORAL_TABLET | Freq: Every day | ORAL | 4 refills | Status: DC

## 2016-01-27 MED ORDER — ROSUVASTATIN CALCIUM 40 MG PO TABS: 40.0000 mg | ORAL_TABLET | Freq: Every day | ORAL | 3 refills | Status: DC

## 2016-01-27 MED ORDER — CLOPIDOGREL BISULFATE 75 MG PO TABS: ORAL_TABLET | ORAL | 3 refills | Status: DC

## 2016-01-27 MED ORDER — NEBIVOLOL HCL 10 MG PO TABS: 10.0000 mg | ORAL_TABLET | Freq: Every day | ORAL | 3 refills | Status: DC

## 2016-01-27 NOTE — Progress Notes (Signed)
Cardiology Office Note  Date:  01/27/2016   ID:  Barbara Bowen, DOB 06/16/1955, MRN [id number]  PCP:  Loistine Chance, MD   Chief Complaint  Patient presents with  . other    6 month follow up. Meds reviewed by the pt. verbally.     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, 30-40% disease in the LAD and left circumflex, 40-50% mid RCA disease after the stent, COPD, insomnia, chronic low back pain who presents for routine follow up of her coronary artery disease She has chronic insomnia, snores heavily Last catheterization in 2015, stent placed to distal RCA  In follow-up today she reports that she is having fleeting chest pain around her xiphoid area Comes and goes, not a regular pattern Not associated with exertion Comes on when she is in bed, playing a game, at rest, sometimes sweeping the floor, Hits for a few seconds, then goes away  Previously had burning pain as angina,  Currently does not have any burning pain on exertion  Weight down 5 pounds, Eats like "crazy". Does not restrict her diet in any way Recently a whole bag of cheese sticks   She continues to smoke over one pack per day, no desire to quit Taking care of her elderly family member, significant stress  hgbA1C has gone from 7.0 to 9.0 and now down to 8.3%   Had not started the shot yet when labs were drawn  Tolerating Crestor, previous total cholesterol around 200  EKG shows normal sinus rhythm with T-wave abnormality, no significant change compared to prior EKG.  Other past medical history Previously reported having "TIAs" on a regular basis,  Reports that she has vision issues typically in one eye, occasionally  feels lightheaded, feel slow. She calls these her TIAs  Cardiac catheterization in 2015 95% distal RCA disease. With DES placed. 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 Stent size was 3.0 x 23 mm stent  Previous 48-hour Holter monitor that showed frequent PVCs. She continues to take low-dose beta blocker in the evening. bystolic 10 mg daily She is taking her Crestor inconsistently. She continues to use oxygen at nighttime  History of obstructive sleep apnea and only uses nasal cannula oxygen. She reports having sleep study x2 in the past. The second sleep study she did not sleep for very long. She currently sleeps 2 to 3 hours per night, wakes up at 2 AM.  no significant improvement In her sleep hygiene with nasal cannula oxygen She still wakes herself up snoring. She has seen neurology in the past with no insight into her sleep hygiene.  Cardiac catheterization from February 2012: Left main: Long. Anomalous origin off right cooronary cusp. Appears to run anterior to the aorta  30-40%  LAD:  mild luminal plaqure.. small mid intramyocardial bridge without flow limitation.  LCX:  small. made up primarily of an OM-1. 30-40% ostiall lesion   RCA: Large dominant vessel.Proximal stent widely patent. 40-50% mid after stent. mild plaquing distally.  LV: EF55% no regional wall motion abnormalities Stable CAD with anomalous left main and normal LV  function.    PMH:   has a past medical history of Anxiety; CHF (congestive heart failure) ([place]); COPD (chronic obstructive pulmonary disease) ([place]); Coronary artery disease; DM type 2 (diabetes mellitus, type 2) ([place]); Enthesopathy of hip region; Essential hypertension; Headache; TIA (transient ischemic attack); Hyperlipidemia; Insomnia, unspecified; Leukocytosis, unspecified; Lipoma of other skin and subcutaneous tissue; OA (osteoarthritis);  Occlusion and stenosis of carotid artery without mention of cerebral infarction; Palpitations; Paroxysmal atrial fibrillation ([place]); Plantar fascial fibromatosis; Proteinuria; Stroke ([place]); Tobacco use disorder; Unspecified sleep apnea; and Unspecified tinnitus.  PSH:    Past Surgical History:  Procedure Laterality Date  . ABDOMINAL HYSTERECTOMY    . APPENDECTOMY    . BREAST SURGERY Right 06-09-2012   biopsy x2 9 oclock and 11 oclock  . CARDIAC CATHETERIZATION  2009   s/p right coronary artery drug-eluting stent  . CARDIAC CATHETERIZATION N/A 12/16/2014   Procedure: Left Heart Cath and Coronary Angiography;  Surgeon: Minna Merritts, MD;  Location: [facility];  Service: Cardiovascular;  Laterality: N/A;  . CORONARY ANGIOPLASTY  2015  . exploratory thoracotomy    . MOLE REMOVAL      Current Outpatient Prescriptions  Medication Sig Dispense Refill  . Albiglutide 30 MG PEN Inject 30 mg into the skin once a week. 4 each 2  . albuterol (PROAIR HFA) 108 (90 Base) MCG/ACT inhaler Inhale 2 puffs into the lungs every 6 (six) hours as needed. 1 Inhaler 0  . ALPRAZolam (XANAX) 0.5 MG tablet Take 1 tablet (0.5 mg total) by mouth 2 (two) times daily as needed. 60 tablet 2  . aspirin 81 MG EC tablet Take 81 mg by mouth daily.     . clopidogrel (PLAVIX) 75 MG tablet TAKE 1 TABLET (75 MG TOTAL) BY MOUTH DAILY. 90 tablet 3  . HYDROcodone-acetaminophen (NORCO) 10-325 MG tablet Take 1 tablet by mouth every 6 (six) hours as needed. Take 1 tablet daily as needed for headaches 30 tablet 0  . ketoconazole (NIZORAL) 2 % cream Apply 1 application topically 2 (two) times daily. Mix with hydrocortison 10 otc ( patient will buy it otc ) 60 g 2  . metFORMIN (GLUCOPHAGE XR) 500 MG 24 hr tablet Take 1 tablet (500 mg total) by mouth daily with breakfast. 90 tablet 1  . nebivolol (BYSTOLIC) 10 MG tablet Take 1 tablet (10 mg total) by mouth daily. 90 tablet 3  . nitroGLYCERIN (NITRO-DUR) 0.2 mg/hr patch Place 1  patch (0.2 mg total) onto the skin daily. 30 patch 6  . nitroGLYCERIN (NITROSTAT) 0.4 MG SL tablet Place 1 tablet (0.4 mg total) under the tongue every 5 (five) minutes as needed. 25 tablet 6  . rosuvastatin (CRESTOR) 40 MG tablet Take 1 tablet (40 mg total) by mouth daily. 90 tablet 3  . valsartan (DIOVAN) 80 MG tablet Take 1 tablet (80 mg total) by mouth daily. In place of Losartan per insurance 30 tablet 2   No current facility-administered medications for this visit.      Allergies:   Amoxicillin-pot clavulanate; Cefprozil; Eggs or egg-derived products; Esomeprazole magnesium; Montelukast sodium; Omeprazole; Penicillins; Prednisone; Spiriva [tiotropium bromide monohydrate]; and Advair diskus [fluticasone-salmeterol]   Social History:  The patient  reports that she has been smoking Cigarettes.  She started smoking about 38 years ago. She has a 74.00 pack-year smoking history. She has never used smokeless tobacco. She reports that she does not drink alcohol or use drugs.   Family History:   family history includes Breast cancer in  her maternal aunt; Coronary artery disease in her mother; Diabetes type II in her mother; Heart disease in her father.    Review of Systems: Review of Systems  Constitutional: Negative.   Respiratory: Negative.   Cardiovascular: Negative.   Gastrointestinal: Negative.   Musculoskeletal: Positive for joint pain.  Neurological: Negative.   Psychiatric/Behavioral: Negative.   All other systems reviewed and are negative.    PHYSICAL EXAM: VS:  BP 120/70 (BP Location: Left Arm, Patient Position: Sitting, Cuff Size: Normal)   Pulse 65   Ht 5' 0.5" (1.537 m)   Wt 209 lb 8 oz (95 kg)   BMI 40.24 kg/m  , BMI Body mass index is 40.24 kg/m. GEN: Well nourished, well developed, in no acute distress , obese HEENT: normal  Neck: no JVD, carotid bruits, or masses Cardiac: RRR; no murmurs, rubs, or gallops,no edema  Respiratory: Mildly decreased breath sounds  throughout, no wheezes, Rales , normal work of breathing GI: soft, nontender, nondistended, + BS MS: no deformity or atrophy  Skin: warm and dry, no rash Neuro:  Strength and sensation are intact Psych: euthymic mood, full affect    Recent Labs: 03/11/2015: TSH 2.240 08/23/2015: ALT 26; BUN 6; Creatinine, Ser 0.76; Platelets 245; Potassium 4.6; Sodium 144    Lipid Panel Lab Results  Component Value Date   CHOL 203 (H) 08/23/2015   HDL 38 (L) 08/23/2015   LDLCALC 128 (H) 08/23/2015   TRIG 187 (H) 08/23/2015      Wt Readings from Last 3 Encounters:  01/27/16 209 lb 8 oz (95 kg)  12/28/15 210 lb 11.8 oz (95.6 kg)  11/03/15 214 lb 1.6 oz (97.1 kg)       ASSESSMENT AND PLAN:  Angina pectoris ([place]) - Plan: EKG 12-Lead Atypical chest pain Different from her previous anginal symptoms Long discussion about various treatment options She does not want catheterization and a think this is appropriate We did offer pharmacologic Myoview, she prefers to wait at this time given atypical nature of her symptoms. We have recommended she call if symptoms get worse, particular with exertion, especially for any burning in her chest on exertion  Pure hypercholesterolemia - Plan: EKG 12-Lead Cholesterol above goal, recommended she start zetia. Side effects discussed with her. Goal total cholesterol discussed with her  Coronary artery disease involving native coronary artery of native heart with unstable angina pectoris ([place]) - Plan: EKG 12-Lead Symptoms as above, will not order any testing. Currently having atypical symptoms around her xiphoid area lasting only several seconds  Essential hypertension - Plan: EKG 12-Lead Blood pressure is well controlled on today's visit. No changes made to the medications.  Chronic obstructive pulmonary disease, unspecified COPD type ([place]) - Plan: EKG 12-Lead Continues to smoke, denies any COPD exacerbations On inhalers  Uncontrolled type 2 diabetes  mellitus with diabetic nephropathy, unspecified long term insulin use status ([place]) Discussed her diet with her, recommended low carbohydrate diet Also recommended she start water aerobics Discussed with family in the room   Total encounter time more than 25 minutes  Greater than 50% was spent in counseling and coordination of care with the patient   Disposition:   F/U  6 months   Orders Placed This Encounter  Procedures  . EKG 12-Lead     Signed, Esmond Plants, M.D., Ph.D. 01/27/2016  [place], [place]

## 2016-01-27 NOTE — Patient Instructions (Addendum)
Medication Instructions:   Pleas start zetia one a day for cholesterol  Labwork:  No new labs needed  Testing/Procedures:  No further testing at this time  Please call if you have worsening chest pain We could order a stress test   Follow-Up: It was a pleasure seeing you in the office today. Please call us if you have new issues that need to be addressed before your next appt.  [phone]  Your physician wants you to follow-up in: 6 months.  You will receive a reminder letter in the mail two months in advance. If you don't receive a letter, please call our office to schedule the follow-up appointment.  If you need a refill on your cardiac medications before your next appointment, please call your pharmacy.     Steps to Quit Smoking  Smoking tobacco can be harmful to your health and can affect almost every organ in your body. Smoking puts you, and those around you, at risk for developing many serious chronic diseases. Quitting smoking is difficult, but it is one of the best things that you can do for your health. It is never too late to quit. WHAT ARE THE BENEFITS OF QUITTING SMOKING? When you quit smoking, you lower your risk of developing serious diseases and conditions, such as:  Lung cancer or lung disease, such as COPD.  Heart disease.  Stroke.  Heart attack.  Infertility.  Osteoporosis and bone fractures. Additionally, symptoms such as coughing, wheezing, and shortness of breath may get better when you quit. You may also find that you get sick less often because your body is stronger at fighting off colds and infections. If you are pregnant, quitting smoking can help to reduce your chances of having a baby of low birth weight. HOW DO I GET READY TO QUIT? When you decide to quit smoking, create a plan to make sure that you are successful. Before you quit:  Pick a date to quit. Set a date within the next two weeks to give you time to prepare.  Write down the  reasons why you are quitting. Keep this list in places where you will see it often, such as on your bathroom mirror or in your car or wallet.  Identify the people, places, things, and activities that make you want to smoke (triggers) and avoid them. Make sure to take these actions:  Throw away all cigarettes at home, at work, and in your car.  Throw away smoking accessories, such as Scientist, research (medical).  Clean your car and make sure to empty the ashtray.  Clean your home, including curtains and carpets.  Tell your family, friends, and coworkers that you are quitting. Support from your loved ones can make quitting easier.  Talk with your health care provider about your options for quitting smoking.  Find out what treatment options are covered by your health insurance. WHAT STRATEGIES CAN I USE TO QUIT SMOKING?  Talk with your healthcare provider about different strategies to quit smoking. Some strategies include:  Quitting smoking altogether instead of gradually lessening how much you smoke over a period of time. Research shows that quitting "cold [place]" is more successful than gradually quitting.  Attending in-person counseling to help you build problem-solving skills. You are more likely to have success in quitting if you attend several counseling sessions. Even short sessions of 10 minutes can be effective.  Finding resources and support systems that can help you to quit smoking and remain smoke-free after you quit. These resources  are most helpful when you use them often. They can include:  Online chats with a Social worker.  Telephone quitlines.  Printed Furniture conservator/restorer.  Support groups or group counseling.  Text messaging programs.  Mobile phone applications.  Taking medicines to help you quit smoking. (If you are pregnant or breastfeeding, talk with your health care provider first.) Some medicines contain nicotine and some do not. Both types of medicines help with  cravings, but the medicines that include nicotine help to relieve withdrawal symptoms. Your health care provider may recommend:  Nicotine patches, gum, or lozenges.  Nicotine inhalers or sprays.  Non-nicotine medicine that is taken by mouth. Talk with your health care provider about combining strategies, such as taking medicines while you are also receiving in-person counseling. Using these two strategies together makes you more likely to succeed in quitting than if you used either strategy on its own. If you are pregnant or breastfeeding, talk with your health care provider about finding counseling or other support strategies to quit smoking. Do not take medicine to help you quit smoking unless told to do so by your health care provider. WHAT THINGS CAN I DO TO MAKE IT EASIER TO QUIT? Quitting smoking might feel overwhelming at first, but there is a lot that you can do to make it easier. Take these important actions:  Reach out to your family and friends and ask that they support and encourage you during this time. Call telephone quitlines, reach out to support groups, or work with a counselor for support.  Ask people who smoke to avoid smoking around you.  Avoid places that trigger you to smoke, such as bars, parties, or smoke-break areas at work.  Spend time around people who do not smoke.  Lessen stress in your life, because stress can be a smoking trigger for some people. To lessen stress, try:  Exercising regularly.  Deep-breathing exercises.  Yoga.  Meditating.  Performing a body scan. This involves closing your eyes, scanning your body from head to toe, and noticing which parts of your body are particularly tense. Purposefully relax the muscles in those areas.  Download or purchase mobile phone or tablet apps (applications) that can help you stick to your quit plan by providing reminders, tips, and encouragement. There are many free apps, such as QuitGuide from the [employer]  Office manager for Disease Control and Prevention). You can find other support for quitting smoking (smoking cessation) through smokefree.gov and other websites. HOW WILL I FEEL WHEN I QUIT SMOKING? Within the first 24 hours of quitting smoking, you may start to feel some withdrawal symptoms. These symptoms are usually most noticeable 2-3 days after quitting, but they usually do not last beyond 2-3 weeks. Changes or symptoms that you might experience include:  Mood swings.  Restlessness, anxiety, or irritation.  Difficulty concentrating.  Dizziness.  Strong cravings for sugary foods in addition to nicotine.  Mild weight gain.  Constipation.  Nausea.  Coughing or a sore throat.  Changes in how your medicines work in your body.  A depressed mood.  Difficulty sleeping (insomnia). After the first 2-3 weeks of quitting, you may start to notice more positive results, such as:  Improved sense of smell and taste.  Decreased coughing and sore throat.  Slower heart rate.  Lower blood pressure.  Clearer skin.  The ability to breathe more easily.  Fewer sick days. Quitting smoking is very challenging for most people. Do not get discouraged if you are not successful the first time. Some  people need to make many attempts to quit before they achieve long-term success. Do your best to stick to your quit plan, and talk with your health care provider if you have any questions or concerns.   This information is not intended to replace advice given to you by your health care provider. Make sure you discuss any questions you have with your health care provider.   Document Released: 04/03/2001 Document Revised: 08/24/2014 Document Reviewed: 08/24/2014 Elsevier Interactive Patient Education 2016 [employer]. Smoking Hazards Smoking cigarettes is extremely bad for your health. Tobacco smoke has over 200 known poisons in it. It contains the poisonous gases nitrogen oxide and carbon monoxide.  There are over 60 chemicals in tobacco smoke that cause cancer. Some of the chemicals found in cigarette smoke include:   Cyanide.   Benzene.   Formaldehyde.   Methanol (wood alcohol).   Acetylene (fuel used in welding torches).   Ammonia.  Even smoking lightly shortens your life expectancy by several years. You can greatly reduce the risk of medical problems for you and your family by stopping now. Smoking is the most preventable cause of death and disease in our society. Within days of quitting smoking, your circulation improves, you decrease the risk of having a heart attack, and your lung capacity improves. There may be some increased phlegm in the first few days after quitting, and it may take months for your lungs to clear up completely. Quitting for 10 years reduces your risk of developing lung cancer to almost that of a nonsmoker.  WHAT ARE THE RISKS OF SMOKING? Cigarette smokers have an increased risk of many serious medical problems, including:  Lung cancer.   Lung disease (such as pneumonia, bronchitis, and emphysema).   Heart attack and chest pain due to the heart not getting enough oxygen (angina).   Heart disease and peripheral blood vessel disease.   Hypertension.   Stroke.   Oral cancer (cancer of the lip, mouth, or voice box).   Bladder cancer.   Pancreatic cancer.   Cervical cancer.   Pregnancy complications, including premature birth.   Stillbirths and smaller newborn babies, birth defects, and genetic damage to sperm.   Early menopause.   Lower estrogen level for women.   Infertility.   Facial wrinkles.   Blindness.   Increased risk of broken bones (fractures).   Senile dementia.   Stomach ulcers and internal bleeding.   Delayed wound healing and increased risk of complications during surgery. Because of secondhand smoke exposure, children of smokers have an increased risk of the following:   Sudden infant death  syndrome (SIDS).   Respiratory infections.   Lung cancer.   Heart disease.   Ear infections.  WHY IS SMOKING ADDICTIVE? Nicotine is the chemical agent in tobacco that is capable of causing addiction or dependence. When you smoke and inhale, nicotine is absorbed rapidly into the bloodstream through your lungs. Both inhaled and noninhaled nicotine may be addictive.  WHAT ARE THE BENEFITS OF QUITTING?  There are many health benefits to quitting smoking. Some are:   The likelihood of developing cancer and heart disease decreases. Health improvements are seen almost immediately.   Blood pressure, pulse rate, and breathing patterns start returning to normal soon after quitting.   People who quit may see an improvement in their overall quality of life.  HOW DO YOU QUIT SMOKING? Smoking is an addiction with both physical and psychological effects, and longtime habits can be hard to change. Your health care  provider can recommend:  Programs and community resources, which may include group support, education, or therapy.  Replacement products, such as patches, gum, and nasal sprays. Use these products only as directed. Do not replace cigarette smoking with electronic cigarettes (commonly called e-cigarettes). The safety of e-cigarettes is unknown, and some may contain harmful chemicals. FOR MORE INFORMATION  American Lung Association: www.lung.org  American Cancer Society: www.cancer.org   This information is not intended to replace advice given to you by your health care provider. Make sure you discuss any questions you have with your health care provider.   Document Released: 05/17/2004 Document Revised: 01/28/2013 Document Reviewed: 09/29/2012 Elsevier Interactive Patient Education 2016 [place] WHAT IS SECONDHAND SMOKE? Secondhand smoke is smoke that comes from burning tobacco. It could be the smoke from a cigarette, a pipe, or a cigar. Even if you are  not the one smoking, secondhand smoke exposes you to the dangers of smoking. This is called involuntary, or passive, smoking. There are two types of secondhand smoke:  Sidestream smoke is the smoke that comes off the lighted end of a cigarette, pipe, or cigar.  This type of smoke has the highest amount of cancer-causing agents (carcinogens).  The particles in sidestream smoke are smaller. They get into your lungs more easily.  Mainstream smoke is the smoke that is exhaled by a person who is smoking.  This type of smoke is also dangerous to your health. HOW CAN SECONDHAND SMOKE AFFECT MY HEALTH? Studies show that there is no safe level of secondhand smoke. This smoke contains thousands of chemicals. At least 16 of them are known to cause cancer. Secondhand smoke can also cause many other health problems. It has been linked to:  Lung cancer.  Cancer of the voice box (larynx) or throat.  Cancer of the sinuses.  Brain cancer.  Bladder cancer.  Stomach cancer.  Breast cancer.  White blood cell cancers (lymphoma and leukemia).  Brain and liver tumors in children.  Heart disease and stroke in adults.  Pregnancy loss (miscarriage).  Diseases in children, such as:  Asthma.  Lung infections.  Ear infections.  Sudden infant death syndrome (SIDS).  Slow growth. WHERE CAN I BE AT RISK FOR EXPOSURE TO SECONDHAND SMOKE?   For adults, the workplace is the main source of exposure to secondhand smoke.  Your workplace should have a policy separating smoking areas from nonsmoking areas.  Smoking areas should have a system for ventilating and cleaning the air.  For children, the home may be the most dangerous place for exposure to secondhand smoke.  Children who live in apartment buildings may be at risk from smoke drifting from hallways or other people's homes.  For everyone, many public places are possible sources of exposure to secondhand smoke.  These places include  restaurants, shopping centers, and parks. HOW CAN I REDUCE MY RISK FOR EXPOSURE TO SECONDHAND SMOKE? The most important thing you can do is not smoke. Discourage family members from smoking. Other ways to reduce exposure for you and your family include the following:  Keep your home smoke free.  Make sure your child care providers do not smoke.  Warn your child about the dangers of smoking and secondhand smoke.  Do not allow smoking in your car. When someone smokes in a car, all the damaging chemicals from the smoke are confined in a small area.  Avoid public places where smoking is allowed.   This information is not intended to replace advice given  to you by your health care provider. Make sure you discuss any questions you have with your health care provider.   Document Released: 05/17/2004 Document Revised: 04/30/2014 Document Reviewed: 07/24/2013 Elsevier Interactive Patient Education [employer].

## 2016-01-27 NOTE — Telephone Encounter (Signed)
Pt requiring PA for ZETIA 10 mg tablet. [id number] PA# [id number] Spoke with Consuela PA may take up to 24 hrs for response, Awaiting approval.

## 2016-01-31 ENCOUNTER — Telehealth: Payer: Self-pay | Admitting: *Deleted

## 2016-01-31 NOTE — Telephone Encounter (Signed)
Pt must have failed 2 preferred medications for Zetia per [place] [facility] Pravastatin  Atorvastatin Simvastatin  Please advise.

## 2016-01-31 NOTE — Telephone Encounter (Signed)
Please see note below. 

## 2016-02-01 MED ORDER — EZETIMIBE 10 MG PO TABS: 10.0000 mg | ORAL_TABLET | Freq: Every day | ORAL | 4 refills | Status: DC

## 2016-02-01 NOTE — Telephone Encounter (Signed)
Spoke with Lisbeth Ply through [facility] and she mentioned that pt medication Zetia 10 mg tablet has been approved through 01/21/17 per pharmacist.

## 2016-02-01 NOTE — Addendum Note (Signed)
Addended by: Othelia Pulling C on: 02/01/2016 11:27 AM   Modules accepted: Orders

## 2016-02-01 NOTE — Telephone Encounter (Signed)
Spoke with patient and let her know that medication was processed and authorization was approved and it is ready for pick up at the pharmacy. She appreciative for my call and had no further questions at this time.

## 2016-02-03 ENCOUNTER — Ambulatory Visit: Payer: Medicaid Other | Attending: Internal Medicine

## 2016-02-03 DIAGNOSIS — G4700 Insomnia, unspecified: Secondary | ICD-10-CM | POA: Insufficient documentation

## 2016-02-03 DIAGNOSIS — Z7902 Long term (current) use of antithrombotics/antiplatelets: Secondary | ICD-10-CM | POA: Insufficient documentation

## 2016-02-03 DIAGNOSIS — Z7982 Long term (current) use of aspirin: Secondary | ICD-10-CM | POA: Insufficient documentation

## 2016-02-03 DIAGNOSIS — R0683 Snoring: Secondary | ICD-10-CM | POA: Insufficient documentation

## 2016-02-03 DIAGNOSIS — Z79899 Other long term (current) drug therapy: Secondary | ICD-10-CM | POA: Insufficient documentation

## 2016-03-29 ENCOUNTER — Ambulatory Visit (INDEPENDENT_AMBULATORY_CARE_PROVIDER_SITE_OTHER): Payer: Medicaid Other | Admitting: Family Medicine

## 2016-03-29 ENCOUNTER — Encounter: Payer: Self-pay | Admitting: Family Medicine

## 2016-03-29 VITALS — BP 124/68 | HR 76 | Temp 98.1°F | Resp 16 | Ht 61.0 in | Wt 208.0 lb

## 2016-03-29 DIAGNOSIS — E1121 Type 2 diabetes mellitus with diabetic nephropathy: Secondary | ICD-10-CM

## 2016-03-29 DIAGNOSIS — G43009 Migraine without aura, not intractable, without status migrainosus: Secondary | ICD-10-CM

## 2016-03-29 DIAGNOSIS — J449 Chronic obstructive pulmonary disease, unspecified: Secondary | ICD-10-CM | POA: Diagnosis not present

## 2016-03-29 DIAGNOSIS — E1165 Type 2 diabetes mellitus with hyperglycemia: Secondary | ICD-10-CM | POA: Diagnosis not present

## 2016-03-29 DIAGNOSIS — G4709 Other insomnia: Secondary | ICD-10-CM | POA: Diagnosis not present

## 2016-03-29 DIAGNOSIS — I480 Paroxysmal atrial fibrillation: Secondary | ICD-10-CM

## 2016-03-29 DIAGNOSIS — I200 Unstable angina: Secondary | ICD-10-CM | POA: Diagnosis not present

## 2016-03-29 DIAGNOSIS — G4733 Obstructive sleep apnea (adult) (pediatric): Secondary | ICD-10-CM | POA: Diagnosis not present

## 2016-03-29 DIAGNOSIS — G4736 Sleep related hypoventilation in conditions classified elsewhere: Secondary | ICD-10-CM | POA: Diagnosis not present

## 2016-03-29 DIAGNOSIS — I10 Essential (primary) hypertension: Secondary | ICD-10-CM | POA: Diagnosis not present

## 2016-03-29 DIAGNOSIS — J4489 Other specified chronic obstructive pulmonary disease: Secondary | ICD-10-CM

## 2016-03-29 LAB — POCT GLYCOSYLATED HEMOGLOBIN (HGB A1C): Hemoglobin A1C: 9.4

## 2016-03-29 MED ORDER — INSULIN PEN NEEDLE 32G X 6 MM MISC: 1.0000 | Freq: Every day | 1 refills | Status: DC

## 2016-03-29 MED ORDER — ALPRAZOLAM 0.5 MG PO TABS: 0.5000 mg | ORAL_TABLET | Freq: Every day | ORAL | 2 refills | Status: DC

## 2016-03-29 MED ORDER — INSULIN DEGLUDEC-LIRAGLUTIDE 100-3.6 UNIT-MG/ML ~~LOC~~ SOPN: 16.0000 [IU] | PEN_INJECTOR | Freq: Every day | SUBCUTANEOUS | 0 refills | Status: DC

## 2016-03-29 MED ORDER — HYDROCODONE-ACETAMINOPHEN 10-325 MG PO TABS: 1.0000 | ORAL_TABLET | Freq: Four times a day (QID) | ORAL | 0 refills | Status: DC | PRN

## 2016-03-29 NOTE — Progress Notes (Signed)
Name: Barbara Bowen   MRN: [id number]    DOB: 06-21-1955   Date:03/29/2016       Progress Note  Subjective  Chief Complaint  Chief Complaint  Patient presents with  . Diabetes    3 month follow up  . COPD  . 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 She has not been very compliant with her diet ( still eating doughnuts, cakes, cookies and brownies ), she also forgets to inject self with Tanzeum because it has to be at room temperature before she can inject. She does not check her glucose unless she feels like glucose is dropping.  She is unable to exercise because of COPD/CAD. She denies polyphagia, polyuria but she has polydipsia. She is on ARB and stating, Zetia and plavix and aspirin daily   CAD: last cath done by Dr. Rockey Situ was in 2016 , stents still open, she is on medication managements at this time. She states she uses NTG patch prn, last time she used the patch was 5 days ago, never used oral NTG  HTN: taking betablocker, and Diovan. Intermittent chest pain but palpitation is controlled with Bystolic. SOB  secondary to COPD  Migraine headaches: She is taking care of her mother - she fell and she has been living with her in [place]  She has been more stressed. Seen by neurologist but per patient can't tolerate any prophylactic medication, the only thing that works for her is hydrodocone prn, 30 pills to last 90 days. She tried Neurontin on her last visit but stopped on her own, states made her feel groggy. She has an average of 3 episodes per week. Pain is usually left frontal and radiates to occipital area, associated with nausea, no vomiting, photophobia and phonophobia.   Insomnia: she denies depression or anxiety, takes alprazolam for sleep, it does not last all night, we tried her on Seroquel but she states it caused her nightmares. She also afraid of  other medications for sleep.  Hyperlipidemia: taking crestor daily now, LDL was not at goal on her last labs  COPD/Asthma : allergic to Spiriva, uses nocturnal oxygen, still smoking, has daily symptoms ( SOB and cough) . She refuses taking any medication. Symbicort and Advair caused palpitation, Spiriva caused migraine, she is only on Proair prn. Seen by local pulmonologist and advised to go to [facility] but she is afraid to drive there.  OSA: had a CPAP machine at home, but had problems getting oxygen with machine so they took it away, we sent her for another study but she could not sleep.   Right low back pain with radiculitis: she states she fell down steps 10 years ago and developed acute low back pain and radiculitis. She had some imaging at the time, but not sure of what type. She can't take prednisone. She states this episode of pain started about two months ago. Pain is described as sharp , no numbness or tingling. Pain goes from right lower back to lateral aspect of right knee occasionally. She was given Gabapentin in July but did not like it because it caused sedation, so she stopped medication on her own   Afib: denies palpitation , she has intermittent chest pain. On plavix and Bystolic. Sees Dr. Rockey Situ   Patient Active Problem List   Diagnosis Date Noted  . Spinal stenosis 10/15/2015  . Bilateral tinnitus 08/23/2015  .  B12 deficiency 03/31/2015  . OSA (obstructive sleep apnea) 03/31/2015  . 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 Intertrigo 02/23/2015  . Coronary artery disease   . Insomnia 12/29/2014  . Migraine without aura and without status migrainosus, not intractable 12/29/2014  . Leukocytosis 12/24/2014  . Macrocytic 12/24/2014  . Unstable angina ([place])   . Essential hypertension   . Paroxysmal atrial fibrillation (HCC)   . Diabetes mellitus with renal manifestations, uncontrolled ([place])   . History of MI (myocardial  infarction) 10/30/2013  . Benign neoplasm of breast 09/12/2012  . Nocturnal hypoxemia due to obesity 09/09/2012  . COPD (chronic obstructive pulmonary disease) ([place]) 09/09/2012  . Smoking 03/27/2011  . Hyperlipidemia 10/28/2009  . CAD (coronary artery disease), native coronary artery 10/28/2009  . Shortness of breath 10/28/2009  . Angina pectoris ([place]) 10/28/2009    Past Surgical History:  Procedure Laterality Date  . ABDOMINAL HYSTERECTOMY    . APPENDECTOMY    . BREAST SURGERY Right 06-09-2012   biopsy x2 9 oclock and 11 oclock  . CARDIAC CATHETERIZATION  2009   s/p right coronary artery drug-eluting stent  . CARDIAC CATHETERIZATION N/A 12/16/2014   Procedure: Left Heart Cath and Coronary Angiography;  Surgeon: Minna Merritts, MD;  Location: [facility];  Service: Cardiovascular;  Laterality: N/A;  . CORONARY ANGIOPLASTY  2015  . exploratory thoracotomy    . MOLE REMOVAL      Family History  Problem Relation Age of Onset  . Heart disease Father     CAD  . Breast cancer Maternal Aunt   . Coronary artery disease Mother   . Diabetes type II Mother     Social History   Social History  . Marital status: Married    Spouse name: N/A  . Number of children: N/A  . Years of education: N/A   Occupational History  . unemployed [employer]   Social History Main Topics  . Smoking status: Current Every Day Smoker    Packs/day: 2.00    Years: 37.00    Types: Cigarettes    Start date: 02/01/1978  . Smokeless tobacco: Never Used  . Alcohol use No  . Drug use: No  . Sexual activity: Not Currently   Other Topics Concern  . Not on file   Social History Narrative   Lives in [place] with husband.  Does not work.  Does not routinely exercise.     Current Outpatient Prescriptions:  .  albuterol (PROAIR HFA) 108 (90 Base) MCG/ACT inhaler, Inhale 2 puffs into the lungs every 6 (six) hours as needed., Disp: 1 Inhaler, Rfl: 0 .  ALPRAZolam (XANAX) 0.5 MG tablet, Take 1  tablet (0.5 mg total) by mouth at bedtime., Disp: 30 tablet, Rfl: 2 .  aspirin 81 MG EC tablet, Take 81 mg by mouth daily. , Disp: , Rfl:  .  clopidogrel (PLAVIX) 75 MG tablet, TAKE 1 TABLET (75 MG TOTAL) BY MOUTH DAILY., Disp: 90 tablet, Rfl: 3 .  ezetimibe (ZETIA) 10 MG tablet, Take 1 tablet (10 mg total) by mouth daily., Disp: 30 tablet, Rfl: 4 .  HYDROcodone-acetaminophen (NORCO) 10-325 MG tablet, Take 1 tablet by mouth every 6 (six) hours as needed. Take 1 tablet daily as needed for headaches, Disp: 30 tablet, Rfl: 0 .  Insulin Degludec-Liraglutide (XULTOPHY) 100-3.6 UNIT-MG/ML SOPN, Inject 16-50 Units into the skin daily., Disp: 9 mL, Rfl: 0 .  ketoconazole (NIZORAL) 2 % cream, Apply 1 application topically 2 (two) times  daily. Mix with hydrocortison 10 otc ( patient will buy it otc ), Disp: 60 g, Rfl: 2 .  nebivolol (BYSTOLIC) 10 MG tablet, Take 1 tablet (10 mg total) by mouth daily., Disp: 90 tablet, Rfl: 3 .  nitroGLYCERIN (NITRO-DUR) 0.2 mg/hr patch, Place 1 patch (0.2 mg total) onto the skin daily., Disp: 30 patch, Rfl: 6 .  nitroGLYCERIN (NITROSTAT) 0.4 MG SL tablet, Place 1 tablet (0.4 mg total) under the tongue every 5 (five) minutes as needed., Disp: 25 tablet, Rfl: 6 .  rosuvastatin (CRESTOR) 40 MG tablet, Take 1 tablet (40 mg total) by mouth daily., Disp: 90 tablet, Rfl: 3 .  valsartan (DIOVAN) 80 MG tablet, Take 1 tablet (80 mg total) by mouth daily. In place of Losartan per insurance, Disp: 90 tablet, Rfl: 3  Allergies  Allergen Reactions  . Amoxicillin-Pot Clavulanate     Vomiting   . Cefprozil     Vomiting   . Eggs Or Egg-Derived Products     Hives   . Esomeprazole Magnesium     unknown  . Montelukast Sodium     unknown  . Omeprazole     unknown  . Penicillins     Vomiting blood  . Prednisone     Rapid heart beat & difficulty breathing  . Spiriva [Tiotropium Bromide Monohydrate]     HA  . Advair Diskus [Fluticasone-Salmeterol] Palpitations    Also happened  with Symbicort     ROS  Constitutional: Negative for fever or weight change.  Respiratory: Positive for cough and shortness of breath.   Cardiovascular: Negative for chest pain or palpitations.  Gastrointestinal: Negative for abdominal pain, no bowel changes.  Musculoskeletal: Negative for gait problem or joint swelling.  Skin: Negative for rash.  Neurological: Negative for dizziness , positive for intermittent headache.  No other specific complaints in a complete review of systems (except as listed in HPI above).  Objective  Vitals:   03/29/16 0900  BP: 124/68  Pulse: 76  Resp: 16  Temp: 98.1 F (36.7 C)  TempSrc: Oral  SpO2: 94%  Weight: 208 lb (94.3 kg)  Height: 5\' 1"  (1.549 m)    Body mass index is 39.3 kg/m.  Physical Exam  Constitutional: Patient appears well-developed and well-nourished. Obese No distress.  HEENT: head atraumatic, normocephalic, pupils equal and reactive to light, neck supple, throat within normal limits Cardiovascular: Normal rate, regular rhythm and normal heart sounds.  No murmur heard. No BLE edema. Pulmonary/Chest: Effort normal and breath sounds normal. No respiratory distress. Abdominal: Soft.  There is no tenderness. Psychiatric: Patient has a normal mood and affect. behavior is normal. Judgment and thought content normal.  Recent Results (from the past 2160 hour(s))  POCT HgB A1C     Status: Abnormal   Collection Time: 03/29/16  9:21 AM  Result Value Ref Range   Hemoglobin A1C 9.4      PHQ2/9: Depression screen [facility] 2/9 03/29/2016 11/03/2015 09/30/2015 08/23/2015 06/24/2015  Decreased Interest 0 0 0 0 0  Down, Depressed, Hopeless 0 0 0 0 1  PHQ - 2 Score 0 0 0 0 1     Fall Risk: Fall Risk  03/29/2016 11/03/2015 09/30/2015 08/23/2015 06/24/2015  Falls in the past year? Yes Yes Yes Yes Yes  Number falls in past yr: 1 1 1 1 1   Injury with Fall? No No No No No  Follow up Falls evaluation completed - - - -     Functional Status Survey: Is  the patient  deaf or have difficulty hearing?: No Does the patient have difficulty seeing, even when wearing glasses/contacts?: No Does the patient have difficulty concentrating, remembering, or making decisions?: No Does the patient have difficulty walking or climbing stairs?: No Does the patient have difficulty dressing or bathing?: No Does the patient have difficulty doing errands alone such as visiting a doctor's office or shopping?: No    Assessment & Plan  1. Uncontrolled type 2 diabetes mellitus with diabetic nephropathy, unspecified long term insulin use status (HCC)  - POCT HgB A1C - Insulin Degludec-Liraglutide (XULTOPHY) 100-3.6 UNIT-MG/ML SOPN; Inject 16-50 Units into the skin daily.  Dispense: 9 mL; Refill: 0  2. COPD with asthma ([place])  Still coughing, she was unable to tolerate Spiriva or Advair, caused increase in heart rate. She used to see pulmonologist, on nocturnal oxygen. She states that local providers advised her to go to [place] but she is unable to drive there.   3. 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 OSA (obstructive sleep apnea)  Still does not have CPAP , she went to a new sleep study but was not able to sleep all night so it was inconclusive.   5. Unstable angina (HCC)  stable  6. Essential hypertension  Well controlled with medication  7. Paroxysmal atrial fibrillation (HCC)  Rate controlled at this time  8. Other insomnia  She has difficulty falling and staying asleep, she has been taking Alprazolam to sleep at night and during the day, explained that we need to decrease dose, offered other options but she states she tried many medication that either did not work or caused side effects - ALPRAZolam (XANAX) 0.5 MG tablet; Take 1 tablet (0.5 mg total) by mouth at bedtime.  Dispense: 30 tablet; Refill: 2  9. Migraine without aura and without status migrainosus, not intractable  -  HYDROcodone-acetaminophen (NORCO) 10-325 MG tablet; Take 1 tablet by mouth every 6 (six) hours as needed. Take 1 tablet daily as needed for headaches  Dispense: 30 tablet; Refill: 0

## 2016-05-18 ENCOUNTER — Telehealth: Payer: Self-pay | Admitting: Cardiovascular Disease

## 2016-05-18 ENCOUNTER — Emergency Department: Payer: Medicaid Other

## 2016-05-18 ENCOUNTER — Emergency Department
Admission: EM | Admit: 2016-05-18 | Discharge: 2016-05-18 | Disposition: A | Discharge status: Home / Self Care | Payer: Medicaid Other | Attending: Student in an Organized Health Care Education/Training Program | Admitting: Student in an Organized Health Care Education/Training Program

## 2016-05-18 ENCOUNTER — Encounter: Payer: Self-pay | Admitting: Emergency Medicine

## 2016-05-18 ENCOUNTER — Telehealth: Payer: Self-pay | Admitting: Emergency Medicine

## 2016-05-18 DIAGNOSIS — I10 Essential (primary) hypertension: Secondary | ICD-10-CM | POA: Diagnosis not present

## 2016-05-18 DIAGNOSIS — E119 Type 2 diabetes mellitus without complications: Secondary | ICD-10-CM | POA: Diagnosis not present

## 2016-05-18 DIAGNOSIS — I2510 Atherosclerotic heart disease of native coronary artery without angina pectoris: Secondary | ICD-10-CM | POA: Diagnosis not present

## 2016-05-18 DIAGNOSIS — R079 Chest pain, unspecified: Secondary | ICD-10-CM | POA: Insufficient documentation

## 2016-05-18 DIAGNOSIS — Z794 Long term (current) use of insulin: Secondary | ICD-10-CM | POA: Diagnosis not present

## 2016-05-18 DIAGNOSIS — J449 Chronic obstructive pulmonary disease, unspecified: Secondary | ICD-10-CM | POA: Insufficient documentation

## 2016-05-18 DIAGNOSIS — Z7982 Long term (current) use of aspirin: Secondary | ICD-10-CM | POA: Insufficient documentation

## 2016-05-18 DIAGNOSIS — Z5321 Procedure and treatment not carried out due to patient leaving prior to being seen by health care provider: Secondary | ICD-10-CM | POA: Diagnosis not present

## 2016-05-18 DIAGNOSIS — Z79899 Other long term (current) drug therapy: Secondary | ICD-10-CM | POA: Insufficient documentation

## 2016-05-18 DIAGNOSIS — F17210 Nicotine dependence, cigarettes, uncomplicated: Secondary | ICD-10-CM | POA: Insufficient documentation

## 2016-05-18 LAB — BASIC METABOLIC PANEL
ANION GAP: 9 (ref 5–15)
BUN: 7 mg/dL (ref 6–20)
CO2: 28 mmol/L (ref 22–32)
Calcium: 9.2 mg/dL (ref 8.9–10.3)
Chloride: 99 mmol/L — ABNORMAL LOW (ref 101–111)
Creatinine, Ser: 0.63 mg/dL (ref 0.44–1.00)
GFR calc Af Amer: >60 mL/min (ref >60)
Glucose, Bld: 207 mg/dL — ABNORMAL HIGH (ref 65–99)
POTASSIUM: 3.7 mmol/L (ref 3.5–5.1)
SODIUM: 136 mmol/L (ref 135–145)

## 2016-05-18 LAB — TROPONIN I: Troponin I: <0.03 ng/mL (ref <0.03)

## 2016-05-18 LAB — CBC
HEMATOCRIT: 47.5 % — AB (ref 35.0–47.0)
HEMOGLOBIN: 16.5 g/dL — AB (ref 12.0–16.0)
MCH: 34.3 pg — ABNORMAL HIGH (ref 26.0–34.0)
MCHC: 34.7 g/dL (ref 32.0–36.0)
MCV: 98.8 fL (ref 80.0–100.0)
Platelets: 255 10*3/uL (ref 150–440)
RBC: 4.81 MIL/uL (ref 3.80–5.20)
RDW: 13.7 % (ref 11.5–14.5)
WBC: 13 10*3/uL — AB (ref 3.6–11.0)

## 2016-05-18 NOTE — ED Notes (Signed)
Pt called from lobby three times. Pt did not present when name called.

## 2016-05-18 NOTE — Telephone Encounter (Signed)
Called patient due to lwot to inquire about condition and follow up plans. Person who answered says pt taking a nap.  He agrees to let her know ic alled and I said that she should call her doctor. He says that her doctor called over here so she would be seen.  I explained that her labs were done, so the doctor could look at them at least. He will call them.

## 2016-05-18 NOTE — Telephone Encounter (Signed)
Spoke w/ pt. She reports that she has been sitting in her chair watching TV and playing on her phone. She has developed chest pain that is radiating to her rt arm. She denies SOB, n/v, but she is sweaty and clammy. Pt sounds like she is in NAD, but reports that she had the same sx when she previously needed a cath.  Advised pt that if she feels that her sx are emergent, she will need to call 911 or proceed to the ED. She states that she would prefer to go the ED and will have someone drive her there now.

## 2016-05-18 NOTE — Telephone Encounter (Signed)
Pt c/o of Chest Pain: STAT if CP now or developed within 24 hours  1. Are you having CP right now? No burning down right arm  2. Are you experiencing any other symptoms (ex. SOB, nausea, vomiting, sweating)? No  3. How long have you been experiencing CP? Burning sensation about 30 minutes  4. Is your CP continuous or coming and going? continuouse  5. Have you taken Nitroglycerin? No... Not sure if she should take it.  Please call patient.  ?

## 2016-05-18 NOTE — ED Triage Notes (Signed)
Pt reports central CP that radiates to right arm that began approximately one hour ago.

## 2016-05-28 ENCOUNTER — Telehealth: Payer: Self-pay | Admitting: Cardiovascular Disease

## 2016-05-28 NOTE — Telephone Encounter (Signed)
We could try to change the bystolic 10 mg to bisoprolol 10 mg daily if the former is not covered

## 2016-05-28 NOTE — Telephone Encounter (Signed)
Spoke w/ pt.  Advised her of Dr. Donivan Scull recommendation. Pt states that Bystolic works for her and she will not take anything else.  She asks that we proceed w/ PA and she will call her ins co tomorrow. PA submitted through CoverMyMeds.

## 2016-05-28 NOTE — Telephone Encounter (Signed)
Received notification from CoverMyMeds that pt's Bystolic has been rejected by insurance. There is only a 39% chance of coverage w/ PA, they recommend that pt try one of the following generic meds: Atenolol  Carvedilol Metoprolol succinate ER Bisoprolol Fumarate Metroprolol tartrate All of these meds have a 97% chance of being covered by ins w/ PA.

## 2016-05-29 ENCOUNTER — Telehealth: Payer: Self-pay | Admitting: Cardiovascular Disease

## 2016-05-29 NOTE — Telephone Encounter (Signed)
Pending Prior authorization for bystolic 10mg  qd. [id number]

## 2016-05-31 NOTE — Telephone Encounter (Signed)
Patient spoke with drug company that says PA was approved and we should submit additional information. Please call patient to discuss.

## 2016-06-01 ENCOUNTER — Telehealth: Payer: Self-pay | Admitting: Cardiovascular Disease

## 2016-06-01 NOTE — Telephone Encounter (Signed)
Pt aware bystolic is ready for pick up

## 2016-06-05 ENCOUNTER — Other Ambulatory Visit: Payer: Self-pay | Admitting: *Deleted

## 2016-06-05 MED ORDER — NEBIVOLOL HCL 10 MG PO TABS: 10.0000 mg | ORAL_TABLET | Freq: Every day | ORAL | 3 refills | Status: DC

## 2016-06-05 NOTE — Telephone Encounter (Signed)
I sent Rx refill to [facility] for Bystolic 10 mg tablet.

## 2016-06-28 ENCOUNTER — Encounter: Payer: Self-pay | Admitting: Family Medicine

## 2016-06-28 ENCOUNTER — Ambulatory Visit (INDEPENDENT_AMBULATORY_CARE_PROVIDER_SITE_OTHER): Payer: Medicaid Other | Admitting: Family Medicine

## 2016-06-28 VITALS — BP 134/76 | HR 80 | Temp 98.4°F | Resp 18 | Ht 60.0 in | Wt 205.0 lb

## 2016-06-28 DIAGNOSIS — G4733 Obstructive sleep apnea (adult) (pediatric): Secondary | ICD-10-CM

## 2016-06-28 DIAGNOSIS — I200 Unstable angina: Secondary | ICD-10-CM

## 2016-06-28 DIAGNOSIS — E785 Hyperlipidemia, unspecified: Secondary | ICD-10-CM

## 2016-06-28 DIAGNOSIS — E1165 Type 2 diabetes mellitus with hyperglycemia: Secondary | ICD-10-CM | POA: Diagnosis not present

## 2016-06-28 DIAGNOSIS — G4709 Other insomnia: Secondary | ICD-10-CM

## 2016-06-28 DIAGNOSIS — E1121 Type 2 diabetes mellitus with diabetic nephropathy: Secondary | ICD-10-CM | POA: Diagnosis not present

## 2016-06-28 DIAGNOSIS — E1169 Type 2 diabetes mellitus with other specified complication: Secondary | ICD-10-CM | POA: Insufficient documentation

## 2016-06-28 DIAGNOSIS — I10 Essential (primary) hypertension: Secondary | ICD-10-CM

## 2016-06-28 DIAGNOSIS — J449 Chronic obstructive pulmonary disease, unspecified: Secondary | ICD-10-CM

## 2016-06-28 DIAGNOSIS — G43009 Migraine without aura, not intractable, without status migrainosus: Secondary | ICD-10-CM

## 2016-06-28 DIAGNOSIS — I480 Paroxysmal atrial fibrillation: Secondary | ICD-10-CM

## 2016-06-28 LAB — POCT GLYCOSYLATED HEMOGLOBIN (HGB A1C): Hemoglobin A1C: 11

## 2016-06-28 MED ORDER — HYDROCODONE-ACETAMINOPHEN 10-325 MG PO TABS: 1.0000 | ORAL_TABLET | Freq: Four times a day (QID) | ORAL | 0 refills | Status: DC | PRN

## 2016-06-28 MED ORDER — ALPRAZOLAM 0.5 MG PO TABS: 0.5000 mg | ORAL_TABLET | Freq: Every day | ORAL | 0 refills | Status: DC

## 2016-06-28 MED ORDER — GLUCOSE BLOOD VI STRP: ORAL_STRIP | 12 refills | Status: AC

## 2016-06-28 MED ORDER — ONETOUCH ULTRASOFT LANCETS MISC
12 refills | Status: DC
Start: 2016-06-28 — End: 2017-01-14

## 2016-06-28 MED ORDER — ONETOUCH ULTRA SYSTEM W/DEVICE KIT: 1.0000 | PACK | Freq: Once | 0 refills | Status: AC

## 2016-06-28 NOTE — Progress Notes (Signed)
Name: Barbara Bowen   MRN: [id number]    DOB: Aug 09, 1955   Date:06/28/2016       Progress Note  Subjective  Chief Complaint  Chief Complaint  Patient presents with  . Medication Refill    3 month F/U  . Diabetes    Average- 50 to 80's, Has lose 15 pounds since last visit due to eating raisin's all the time.  Has been very dizzy, shaky and has fell 6-7 times in the past couple of months due to her BS falling.  Marland Kitchen COPD    Stable   . Hypertension    Unchanged, SOB, wheezing, and edema   . Insomnia    Sleeps on average 1.5 hours nightly    HPI  DMII: with vascular disease and diabetic nephropathy and dyslipidemia.  She was started on Metformin and Tanzeum in 2017 because hgbA1C spiked to above 8. She has not been very compliant with her diet ( still eating doughnuts, cakes, cookies and brownies ), we switched her to Ozan back in December, but she developed nausea and also had hypoglycemic episodes and stopped all medications, including statin therapy. She states glucose was going down to 50. She did not call our office and I was not aware of her problems. She has been not taking any medications for DM of cholesterol at this time. Also stopped Diovan. She states glucose at home is still low 100 fasting, which does not match a hgbA1C of 11.  She is unable to exercise because of COPD/CAD. She denies polyphagia, polyuria but she has polydipsia. She has not been taking Diovan, Crestor or  Zetia for the past few months , she has been on  plavix and aspirin daily.   CAD: last cath done by Dr. Rockey Situ was in 2016 , stents still open, she is on medication managements at this time. She states she uses NTG patch prn, off NTG orally  HTN: taking betablocker, and Diovan ( but not compliant at this time) . Intermittent chest pain but palpitation is controlled with Bystolic. SOB  secondary to COPD, but refuses to take inhalers for it.  Migraine headaches: She is taking care of her mother - she fell  and she has been living with her in [place]  She has been more stressed. Seen by neurologist but per patient can't tolerate any prophylactic medication, the only thing that works for her is hydrodocone prn, 30 pills to last 90 days. She tried Neurontin on her last visit but stopped on her own, states made her feel groggy. She has an average of 3 episodes per week. Pain is usually left frontal and radiates to occipital area, associated with nausea, no vomiting, photophobia and phonophobia. Explained to her that I will stop giving her Hydrocodone if she can't be compliant with other medications that can actually prevent her from dying.   Insomnia: she denies depression or anxiety, takes alprazolam for sleep, it does not last all night, we tried her on Seroquel but she states it caused her nightmares. She also afraid of other medications for sleep.   Hyperlipidemia: off medication - stopped a few months ago, explained importance of compliance  COPD/Asthma : allergic to Spiriva, uses nocturnal oxygen, still smoking, has daily symptoms ( SOB and cough) . She refuses taking any medication. Symbicort and Advair caused palpitation, Spiriva caused migraine, she is only on Proair prn. Seen by local pulmonologist and advised to go to [facility] but she is afraid to drive there.  OSA: had  a CPAP machine at home, but had problems getting oxygen with machine so they took it away, we sent her for another study but she could not sleep.   Right low back pain with radiculitis: she states she fell down steps 10 years ago and developed acute low back pain and radiculitis. She had some imaging at the time, but not sure of what type. She can't take prednisone. She states this episode of pain started about two months ago. Pain is described as sharp , no numbness or tingling. Pain goes from right lower back to lateral aspect of right knee occasionally. She was given Gabapentin in July but did not like it because it caused  sedation, so she stopped medication on her own   Afib: denies palpitation , she has intermittent chest pain. On plavix and Bystolic. Sees Dr. Rockey Situ  Patient Active Problem List   Diagnosis Date Noted  . Dyslipidemia associated with type 2 diabetes mellitus ([place]) 06/28/2016  . Spinal stenosis 10/15/2015  . Bilateral tinnitus 08/23/2015  . B12 deficiency 03/31/2015  . OSA (obstructive sleep apnea) 03/31/2015  . 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 Intertrigo 02/23/2015  . Coronary artery disease   . Insomnia 12/29/2014  . Migraine without aura and without status migrainosus, not intractable 12/29/2014  . Leukocytosis 12/24/2014  . Macrocytic 12/24/2014  . Unstable angina ([place])   . Essential hypertension   . Paroxysmal atrial fibrillation (HCC)   . Diabetes mellitus with renal manifestations, uncontrolled ([place])   . History of MI (myocardial infarction) 10/30/2013  . Benign neoplasm of breast 09/12/2012  . Nocturnal hypoxemia due to obesity 09/09/2012  . COPD (chronic obstructive pulmonary disease) ([place]) 09/09/2012  . Smoking 03/27/2011  . Hyperlipidemia 10/28/2009  . CAD (coronary artery disease), native coronary artery 10/28/2009  . Shortness of breath 10/28/2009  . Angina pectoris ([place]) 10/28/2009    Past Surgical History:  Procedure Laterality Date  . ABDOMINAL HYSTERECTOMY    . APPENDECTOMY    . BREAST SURGERY Right 06-09-2012   biopsy x2 9 oclock and 11 oclock  . CARDIAC CATHETERIZATION  2009   s/p right coronary artery drug-eluting stent  . CARDIAC CATHETERIZATION N/A 12/16/2014   Procedure: Left Heart Cath and Coronary Angiography;  Surgeon: Minna Merritts, MD;  Location: [facility];  Service: Cardiovascular;  Laterality: N/A;  . CORONARY ANGIOPLASTY  2015  . exploratory thoracotomy    . MOLE REMOVAL      Family History  Problem Relation Age of Onset  . Heart disease Father     CAD  . Breast cancer Maternal  Aunt   . Coronary artery disease Mother   . Diabetes type II Mother     Social History   Social History  . Marital status: Married    Spouse name: N/A  . Number of children: N/A  . Years of education: N/A   Occupational History  . unemployed [employer]   Social History Main Topics  . Smoking status: Current Every Day Smoker    Packs/day: 2.00    Years: 37.00    Types: Cigarettes    Start date: 02/01/1978  . Smokeless tobacco: Never Used  . Alcohol use No  . Drug use: No  . Sexual activity: Not Currently   Other Topics Concern  . Not on file   Social History Narrative   Lives in [place] with husband.  Does not work.  Does not routinely exercise.     Current  Outpatient Prescriptions:  .  albuterol (PROAIR HFA) 108 (90 Base) MCG/ACT inhaler, Inhale 2 puffs into the lungs every 6 (six) hours as needed., Disp: 1 Inhaler, Rfl: 0 .  ALPRAZolam (XANAX) 0.5 MG tablet, Take 1 tablet (0.5 mg total) by mouth at bedtime., Disp: 30 tablet, Rfl: 0 .  clopidogrel (PLAVIX) 75 MG tablet, TAKE 1 TABLET (75 MG TOTAL) BY MOUTH DAILY., Disp: 90 tablet, Rfl: 3 .  HYDROcodone-acetaminophen (NORCO) 10-325 MG tablet, Take 1 tablet by mouth every 6 (six) hours as needed. Take 1 tablet daily as needed for headaches, Disp: 30 tablet, Rfl: 0 .  nebivolol (BYSTOLIC) 10 MG tablet, Take 1 tablet (10 mg total) by mouth daily., Disp: 90 tablet, Rfl: 3 .  nitroGLYCERIN (NITRO-DUR) 0.2 mg/hr patch, Place 1 patch (0.2 mg total) onto the skin daily., Disp: 30 patch, Rfl: 6 .  aspirin 81 MG EC tablet, Take 81 mg by mouth daily. , Disp: , Rfl:  .  Blood Glucose Monitoring Suppl (ONE TOUCH ULTRA SYSTEM KIT) w/Device KIT, 1 kit by Does not apply route once., Disp: 1 each, Rfl: 0 .  ezetimibe (ZETIA) 10 MG tablet, Take 1 tablet (10 mg total) by mouth daily. (Patient not taking: Reported on 06/28/2016), Disp: 30 tablet, Rfl: 4 .  glucose blood (ONE TOUCH ULTRA TEST) test strip, Use as instructed, Disp: 100 each, Rfl:  12 .  Insulin Degludec-Liraglutide (XULTOPHY) 100-3.6 UNIT-MG/ML SOPN, Inject 16-50 Units into the skin daily. (Patient not taking: Reported on 06/28/2016), Disp: 9 mL, Rfl: 0 .  Insulin Pen Needle 32G X 6 MM MISC, 1 each by Does not apply route daily. (Patient not taking: Reported on 06/28/2016), Disp: 100 each, Rfl: 1 .  ketoconazole (NIZORAL) 2 % cream, Apply 1 application topically 2 (two) times daily. Mix with hydrocortison 10 otc ( patient will buy it otc ) (Patient not taking: Reported on 06/28/2016), Disp: 60 g, Rfl: 2 .  Lancets (ONETOUCH ULTRASOFT) lancets, Use as instructed, Disp: 100 each, Rfl: 12 .  rosuvastatin (CRESTOR) 40 MG tablet, Take 1 tablet (40 mg total) by mouth daily. (Patient not taking: Reported on 06/28/2016), Disp: 90 tablet, Rfl: 3 .  valsartan (DIOVAN) 80 MG tablet, Take 1 tablet (80 mg total) by mouth daily. In place of Losartan per insurance (Patient not taking: Reported on 06/28/2016), Disp: 90 tablet, Rfl: 3  Allergies  Allergen Reactions  . Amoxicillin-Pot Clavulanate     Vomiting   . Cefprozil     Vomiting   . Eggs Or Egg-Derived Products     Hives   . Esomeprazole Magnesium     unknown  . Montelukast Sodium     unknown  . Omeprazole     unknown  . Penicillins     Vomiting blood  . Prednisone     Rapid heart beat & difficulty breathing  . Spiriva [Tiotropium Bromide Monohydrate]     HA  . Advair Diskus [Fluticasone-Salmeterol] Palpitations    Also happened with Symbicort     ROS  Constitutional: Negative for fever or weight change.  Respiratory: Positive  for cough and shortness of breath.   Cardiovascular: Positive for intermittent  chest pain or palpitations.  Gastrointestinal: Negative for abdominal pain, no bowel changes.  Musculoskeletal: Negative for gait problem or joint swelling.  Skin: Negative for rash.  Neurological: Positive  for dizziness ( orthostatic )  , positive for intermittent  headache.  No other specific complaints in a  complete review of systems (except as listed  in HPI above).  Objective  Vitals:   06/28/16 0826  BP: 134/76  Pulse: 80  Resp: 18  Temp: 98.4 F (36.9 C)  TempSrc: Oral  SpO2: 92%  Weight: 205 lb (93 kg)  Height: 5' (1.524 m)    Body mass index is 40.04 kg/m.  Physical Exam  Constitutional: Patient appears well-developed and well-nourished. Obese No distress.  HEENT: head atraumatic, normocephalic, pupils equal and reactive to light, neck supple, throat within normal limits Cardiovascular: Normal rate, regular rhythm and normal heart sounds. No murmur heard. No BLE edema. Pulmonary/Chest: Effort normal and breath sounds normal. No respiratory distress.  Abdominal: Soft. There is no tenderness. Psychiatric: Patient has a normal mood and affect. behavior is normal. Judgment and thought content normal.   Recent Results (from the past 2160 hour(s))  Basic metabolic panel     Status: Abnormal   Collection Time: 05/18/16 10:46 AM  Result Value Ref Range   Sodium 136 135 - 145 mmol/L   Potassium 3.7 3.5 - 5.1 mmol/L   Chloride 99 (L) 101 - 111 mmol/L   CO2 28 22 - 32 mmol/L   Glucose, Bld 207 (H) 65 - 99 mg/dL   BUN 7 6 - 20 mg/dL   Creatinine, Ser 0.63 0.44 - 1.00 mg/dL   Calcium 9.2 8.9 - 10.3 mg/dL   GFR calc non Af Amer >60 >60 mL/min   GFR calc Af Amer >60 >60 mL/min    Comment: (NOTE) The eGFR has been calculated using the CKD EPI equation. This calculation has not been validated in all clinical situations. eGFR's persistently <60 mL/min signify possible Chronic Kidney Disease.    Anion gap 9 5 - 15  CBC     Status: Abnormal   Collection Time: 05/18/16 10:46 AM  Result Value Ref Range   WBC 13.0 (H) 3.6 - 11.0 K/uL   RBC 4.81 3.80 - 5.20 MIL/uL   Hemoglobin 16.5 (H) 12.0 - 16.0 g/dL   HCT 47.5 (H) 35.0 - 47.0 %   MCV 98.8 80.0 - 100.0 fL   MCH 34.3 (H) 26.0 - 34.0 pg   MCHC 34.7 32.0 - 36.0 g/dL   RDW 13.7 11.5 - 14.5 %   Platelets 255 150 - 440 K/uL   Troponin I     Status: None   Collection Time: 05/18/16 10:46 AM  Result Value Ref Range   Troponin I <0.03 <0.03 ng/mL  POCT HgB A1C     Status: Abnormal   Collection Time: 06/28/16  8:40 AM  Result Value Ref Range   Hemoglobin A1C 11.0       PHQ2/9: Depression screen [facility] 2/9 06/28/2016 03/29/2016 11/03/2015 09/30/2015 08/23/2015  Decreased Interest 0 0 0 0 0  Down, Depressed, Hopeless 0 0 0 0 0  PHQ - 2 Score 0 0 0 0 0     Fall Risk: Fall Risk  06/28/2016 03/29/2016 11/03/2015 09/30/2015 08/23/2015  Falls in the past year? Yes Yes Yes Yes Yes  Number falls in past yr: 2 or more '1 1 1 1  '$ Injury with Fall? Yes No No No No  Follow up - Falls evaluation completed - - -     Functional Status Survey: Is the patient deaf or have difficulty hearing?: No Does the patient have difficulty seeing, even when wearing glasses/contacts?: No Does the patient have difficulty concentrating, remembering, or making decisions?: No Does the patient have difficulty walking or climbing stairs?: No Does the patient have difficulty dressing or bathing?:  No Does the patient have difficulty doing errands alone such as visiting a doctor's office or shopping?: No    Assessment & Plan  1. Uncontrolled type 2 diabetes mellitus with diabetic nephropathy, unspecified long term insulin use status ([place])  Explained importance of compliance, following diabetic diet and calling if she has problems with medications, she can't stop taking medications and wait 3 months to be seen during regular follow up. We will refer her to Endo  - POCT HgB A1C - Ambulatory referral to Endocrinology - Blood Glucose Monitoring Suppl (ONE TOUCH ULTRA SYSTEM KIT) w/Device KIT; 1 kit by Does not apply route once.  Dispense: 1 each; Refill: 0 - glucose blood (ONE TOUCH ULTRA TEST) test strip; Use as instructed  Dispense: 100 each; Refill: 12 - Lancets (ONETOUCH ULTRASOFT) lancets; Use as instructed  Dispense: 100 each; Refill: 12 Advised to  resume Xultophy but 5-6 units per day until seen by Endocrinologist, get new meter  2. Dyslipidemia associated with type 2 diabetes mellitus ([place])  She stopped Crestor and Zetia shortly after last visit, she states she was not feeling well, and stopped most of her medications, explained very dangerous for her to stop statin therapy with all her co-morbidities.   3. OSA (obstructive sleep apnea)  She does not have a CPAP machine, she is unable to sleep all night to have another titration study   4. Unstable angina (HCC)  She continues to have pain  5. Other insomnia  - ALPRAZolam (XANAX) 0.5 MG tablet; Take 1 tablet (0.5 mg total) by mouth at bedtime.  Dispense: 30 tablet; Refill: 0  6. COPD with asthma ([place])  Not on medication, only wants to use Proair prn. She has daily symptoms but refuses medications  7. Migraine without aura and without status migrainosus, not intractable  - HYDROcodone-acetaminophen (NORCO) 10-325 MG tablet; Take 1 tablet by mouth every 6 (six) hours as needed. Take 1 tablet daily as needed for headaches  Dispense: 30 tablet; Refill: 0  8. Essential hypertension  She has not been taking Diovan   9. Paroxysmal atrial fibrillation ([place])  She states she stopped for a period of time, but is back on it.

## 2016-07-08 ENCOUNTER — Other Ambulatory Visit: Payer: Self-pay | Admitting: Family Medicine

## 2016-07-08 DIAGNOSIS — G4709 Other insomnia: Secondary | ICD-10-CM

## 2016-07-11 ENCOUNTER — Telehealth: Payer: Self-pay | Admitting: Family Medicine

## 2016-07-11 NOTE — Telephone Encounter (Signed)
She will need to wean off Alprazolam, she has Sleep apnea and is not using CPAP, based on new guidelines it is dangerous to give her BZD. We will have to stop the medication, I will send 20 pills next month, after that 15 pills , than 10 and than 5 . I am sorry. She may want to find another provider if not pleased.

## 2016-07-11 NOTE — Telephone Encounter (Signed)
Pt called wanting to know why she does not have any refills on her aprazolam?

## 2016-07-11 NOTE — Telephone Encounter (Signed)
Patient states she had a sleep study done in October of 2017 and [facility] could never supply the mask that linked to the oxygen she uses at night. Patient states she is trying to be compliant but the last sleep study done more recent, patient could not eat nor get up. She only slept 1.5 hours and could not finish the study. Patient states if we could supply the right kind of gear she will use it.

## 2016-07-12 NOTE — Telephone Encounter (Signed)
Can we see if insurance pays for home study as suggested by Dr. Manuella Ghazi

## 2016-07-12 NOTE — Telephone Encounter (Signed)
Initial order was placed by Dr. Jennings Books, please discuss with him on how to handle this situation regarding this patient or refer her back to his office for him to assume care.

## 2016-08-23 NOTE — Progress Notes (Signed)
Cardiology Office Note  Date:  08/24/2016   ID:  Barbara Bowen, DOB Dec 08, 1955, MRN [id number]  PCP:  Loistine Chance, MD   Chief Complaint  Patient presents with  . OTHER    6 month f/u no complaints today. Meds reviewed verbally with pt.    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, 30-40% disease in the LAD and left circumflex, 40-50% mid RCA disease after the stent,  COPD, insomnia,  chronic low back pain chronic insomnia, snores heavily Last catheterization in 2015, stent placed to distal RCA who presents for routine follow up of her coronary artery disease  In follow-up today we reviewed her lab work HBA1C 11 Total chol 203, LDL 128 She denies having any problems with her diabetes Significant dietary indiscretion  Denies having any chest pain concerning for angina,  no significant shortness of breath on exertion No regular exercise program She does not want to be on insulin Drops her sugars low   She continues to smoke over one pack per day, no desire to quit Recent stressors taking care of her mother over near [place]  EKG personally reviewed by myself on todays visit Shows normal sinus rhythm with rate 69 bpm rare PVC no significant ST or T-wave changes  Other past medical history Previously reported having "TIAs" on a regular basis,  Reports that she has vision issues typically in one eye, occasionally feels lightheaded, feel slow. She calls these her TIAs  Cardiac catheterization in 2015 95% distal RCA disease. With DES placed. 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 Stent size was 3.0 x  23 mm stent  Previous 48-hour Holter monitor that showed frequent PVCs. She continues to take low-dose beta blocker in the evening. bystolic 10 mg daily She is taking her Crestor inconsistently. She continues to use oxygen at nighttime  History of obstructive sleep apnea and only uses nasal cannula oxygen. She reports having sleep study x2 in the past. The second sleep study she did not sleep for very long. She currently sleeps 2 to 3 hours per night, wakes up at 2 AM.  no significant improvement In her sleep hygiene with nasal cannula oxygen She still wakes herself up snoring. She has seen neurology in the past with no insight into her sleep hygiene.  Cardiac catheterization from February 2012: Left main: Long. Anomalous origin off right cooronary cusp. Appears to run anterior to the aorta  30-40%  LAD:  mild luminal plaqure.. small mid intramyocardial bridge without flow limitation.  LCX:  small. made up primarily of an OM-1. 30-40% ostiall lesion   RCA: Large dominant vessel.Proximal stent widely patent. 40-50% mid after stent. mild plaquing distally.  LV: EF55% no regional wall motion abnormalities Stable CAD with anomalous left main and normal LV function.    PMH:   has a past medical history of Anxiety; CHF (congestive heart failure) ([place]); COPD (chronic obstructive pulmonary disease) ([place]); Coronary artery disease; DM type 2 (diabetes mellitus, type 2) ([place]); Enthesopathy of hip region; Essential hypertension; Headache; TIA (transient ischemic attack); Hyperlipidemia; Insomnia, unspecified; Leukocytosis, unspecified; Lipoma of other skin  and subcutaneous tissue; OA (osteoarthritis); Occlusion and stenosis of carotid artery without mention of cerebral infarction; Palpitations; Paroxysmal atrial fibrillation ([place]); Plantar fascial fibromatosis; Proteinuria; Stroke ([place]); Tobacco use disorder; Unspecified sleep apnea; and Unspecified tinnitus.  PSH:    Past Surgical History:  Procedure  Laterality Date  . ABDOMINAL HYSTERECTOMY    . APPENDECTOMY    . BREAST SURGERY Right 06-09-2012   biopsy x2 9 oclock and 11 oclock  . CARDIAC CATHETERIZATION  2009   s/p right coronary artery drug-eluting stent  . CARDIAC CATHETERIZATION N/A 12/16/2014   Procedure: Left Heart Cath and Coronary Angiography;  Surgeon: Minna Merritts, MD;  Location: [facility];  Service: Cardiovascular;  Laterality: N/A;  . CORONARY ANGIOPLASTY  2015  . exploratory thoracotomy    . MOLE REMOVAL      Current Outpatient Prescriptions  Medication Sig Dispense Refill  . albuterol (PROAIR HFA) 108 (90 Base) MCG/ACT inhaler Inhale 2 puffs into the lungs every 6 (six) hours as needed. 1 Inhaler 0  . aspirin 81 MG EC tablet Take 81 mg by mouth daily.     . clopidogrel (PLAVIX) 75 MG tablet TAKE 1 TABLET (75 MG TOTAL) BY MOUTH DAILY. 90 tablet 3  . ezetimibe (ZETIA) 10 MG tablet Take 1 tablet (10 mg total) by mouth daily. 30 tablet 4  . glucose blood (ONE TOUCH ULTRA TEST) test strip Use as instructed 100 each 12  . HYDROcodone-acetaminophen (NORCO) 10-325 MG tablet Take 1 tablet by mouth every 6 (six) hours as needed. Take 1 tablet daily as needed for headaches 30 tablet 0  . ketoconazole (NIZORAL) 2 % cream Apply 1 application topically 2 (two) times daily. Mix with hydrocortison 10 otc ( patient will buy it otc ) 60 g 2  . Lancets (ONETOUCH ULTRASOFT) lancets Use as instructed 100 each 12  . nebivolol (BYSTOLIC) 10 MG tablet Take 1 tablet (10 mg total) by mouth daily. 90 tablet 3  . nitroGLYCERIN (NITRO-DUR) 0.2 mg/hr patch Place 1 patch (0.2 mg total) onto the skin daily. 30 patch 6  . rosuvastatin (CRESTOR) 40 MG tablet Take 1 tablet (40 mg total) by mouth daily. 90 tablet 3   No current facility-administered medications for this visit.      Allergies:   Amoxicillin-pot clavulanate; Cefprozil; Eggs or egg-derived products; Esomeprazole magnesium; Montelukast sodium; Omeprazole; Penicillins;  Prednisone; Spiriva [tiotropium bromide monohydrate]; and Advair diskus [fluticasone-salmeterol]   Social History:  The patient  reports that she has been smoking Cigarettes.  She started smoking about 38 years ago. She has a 74.00 pack-year smoking history. She has never used smokeless tobacco. She reports that she does not drink alcohol or use drugs.   Family History:   family history includes Breast cancer in her maternal aunt; Coronary artery disease in her mother; Diabetes type II in her mother; Heart disease in her father.    Review of Systems: Review of Systems  Constitutional: Negative.   Respiratory: Negative.   Cardiovascular: Negative.   Gastrointestinal: Negative.   Musculoskeletal: Positive for joint pain.  Neurological: Negative.   Psychiatric/Behavioral: Negative.   All other systems reviewed and are negative.    PHYSICAL EXAM: VS:  BP 116/60 (BP Location: Left Arm, Patient Position: Sitting, Cuff Size: Large)   Pulse 69   Ht 5' 0.5" (1.537 m)   Wt 196 lb (88.9 kg)   BMI 37.65 kg/m  , BMI Body mass index is 37.65 kg/m. GEN: Well nourished, well developed, in no acute  distress , obese HEENT: normal  Neck: no JVD, carotid bruits, or masses Cardiac: RRR; no murmurs, rubs, or gallops,no edema  Respiratory: Mildly decreased breath sounds throughout, no wheezes, Rales , normal work of breathing GI: soft, nontender, nondistended, + BS MS: no deformity or atrophy  Skin: warm and dry, no rash Neuro:  Strength and sensation are intact Psych: euthymic mood, full affect    Recent Labs: 05/18/2016: BUN 7; Creatinine, Ser 0.63; Hemoglobin 16.5; Platelets 255; Potassium 3.7; Sodium 136    Lipid Panel Lab Results  Component Value Date   CHOL 203 (H) 08/23/2015   HDL 38 (L) 08/23/2015   LDLCALC 128 (H) 08/23/2015   TRIG 187 (H) 08/23/2015      Wt Readings from Last 3 Encounters:  08/24/16 196 lb (88.9 kg)  06/28/16 205 lb (93 kg)  05/18/16 220 lb (99.8 kg)        ASSESSMENT AND PLAN:   Angina pectoris ([place]) - Plan: EKG 12-Lead Previously with atypical chest pain No significant symptoms on today's visit, no further testing ordered  Pure hypercholesterolemia - Plan: EKG 12-Lead Cholesterol above goal, recommended she start zetia. She does not remember getting a prescription of the past. We have sent in a new prescription  Coronary artery disease involving native coronary artery of native heart with unstable angina pectoris ([place]) - Plan: EKG 12-Lead Currently with no symptoms of angina. No further workup at this time. Continue current medication regimen.  Essential hypertension - Plan: EKG 12-Lead Blood pressure is well controlled on today's visit. No changes made to the medications. She is no longer on ARB  Chronic obstructive pulmonary disease, unspecified COPD type ([place]) - Plan: EKG 12-Lead Continues to smoke, denies any COPD exacerbations On inhalers  Uncontrolled type 2 diabetes mellitus with diabetic nephropathy, unspecified long term insulin use status ([place]) Discussed her diet with her, recommended low carbohydrate diet She feels her sugars are fine Does not want insulin   Total encounter time more than 25 minutes  Greater than 50% was spent in counseling and coordination of care with the patient   Disposition:   F/U  12 months   Orders Placed This Encounter  Procedures  . EKG 12-Lead     Signed, Esmond Plants, M.D., Ph.D. 08/24/2016  [facility] [place], [place] [phone]

## 2016-08-24 ENCOUNTER — Encounter: Payer: Self-pay | Admitting: Cardiovascular Disease

## 2016-08-24 ENCOUNTER — Ambulatory Visit (INDEPENDENT_AMBULATORY_CARE_PROVIDER_SITE_OTHER): Payer: Medicaid Other | Admitting: Cardiovascular Disease

## 2016-08-24 VITALS — BP 116/60 | HR 69 | Ht 60.5 in | Wt 196.0 lb

## 2016-08-24 DIAGNOSIS — E782 Mixed hyperlipidemia: Secondary | ICD-10-CM | POA: Diagnosis not present

## 2016-08-24 DIAGNOSIS — J432 Centrilobular emphysema: Secondary | ICD-10-CM

## 2016-08-24 DIAGNOSIS — E1165 Type 2 diabetes mellitus with hyperglycemia: Secondary | ICD-10-CM

## 2016-08-24 DIAGNOSIS — I10 Essential (primary) hypertension: Secondary | ICD-10-CM | POA: Diagnosis not present

## 2016-08-24 DIAGNOSIS — E1121 Type 2 diabetes mellitus with diabetic nephropathy: Secondary | ICD-10-CM

## 2016-08-24 DIAGNOSIS — I25118 Atherosclerotic heart disease of native coronary artery with other forms of angina pectoris: Secondary | ICD-10-CM | POA: Diagnosis not present

## 2016-08-24 DIAGNOSIS — F17200 Nicotine dependence, unspecified, uncomplicated: Secondary | ICD-10-CM

## 2016-08-24 DIAGNOSIS — G4733 Obstructive sleep apnea (adult) (pediatric): Secondary | ICD-10-CM

## 2016-08-24 MED ORDER — EZETIMIBE 10 MG PO TABS: 10.0000 mg | ORAL_TABLET | Freq: Every day | ORAL | 3 refills | Status: DC

## 2016-08-24 MED ORDER — ROSUVASTATIN CALCIUM 40 MG PO TABS: 40.0000 mg | ORAL_TABLET | Freq: Every day | ORAL | 3 refills | Status: DC

## 2016-08-24 MED ORDER — CLOPIDOGREL BISULFATE 75 MG PO TABS: ORAL_TABLET | ORAL | 3 refills | Status: DC

## 2016-08-24 MED ORDER — NEBIVOLOL HCL 10 MG PO TABS: 10.0000 mg | ORAL_TABLET | Freq: Every day | ORAL | 3 refills | Status: DC

## 2016-08-24 NOTE — Patient Instructions (Addendum)
Medication Instructions:   Please start zetia one a day  Labwork:  No new labs needed  Testing/Procedures:  No further testing at this time   I recommend watching educational videos on topics of interest to you at:       [email or web address]  Enter code: HEARTCARE    Follow-Up: It was a pleasure seeing you in the office today. Please call us if you have new issues that need to be addressed before your next appt.  [phone]  Your physician wants you to follow-up in: 6 months.  You will receive a reminder letter in the mail two months in advance. If you don't receive a letter, please call our office to schedule the follow-up appointment.  If you need a refill on your cardiac medications before your next appointment, please call your pharmacy.     

## 2016-10-05 ENCOUNTER — Ambulatory Visit (INDEPENDENT_AMBULATORY_CARE_PROVIDER_SITE_OTHER): Payer: Medicaid Other | Admitting: Family Medicine

## 2016-10-05 ENCOUNTER — Encounter: Payer: Self-pay | Admitting: Family Medicine

## 2016-10-05 VITALS — BP 122/64 | HR 83 | Temp 98.6°F | Resp 16 | Ht 61.0 in | Wt 197.4 lb

## 2016-10-05 DIAGNOSIS — I200 Unstable angina: Secondary | ICD-10-CM

## 2016-10-05 DIAGNOSIS — J449 Chronic obstructive pulmonary disease, unspecified: Secondary | ICD-10-CM

## 2016-10-05 DIAGNOSIS — E785 Hyperlipidemia, unspecified: Secondary | ICD-10-CM | POA: Diagnosis not present

## 2016-10-05 DIAGNOSIS — E1121 Type 2 diabetes mellitus with diabetic nephropathy: Secondary | ICD-10-CM | POA: Diagnosis not present

## 2016-10-05 DIAGNOSIS — G4709 Other insomnia: Secondary | ICD-10-CM

## 2016-10-05 DIAGNOSIS — E1169 Type 2 diabetes mellitus with other specified complication: Secondary | ICD-10-CM | POA: Diagnosis not present

## 2016-10-05 DIAGNOSIS — E1165 Type 2 diabetes mellitus with hyperglycemia: Secondary | ICD-10-CM | POA: Diagnosis not present

## 2016-10-05 DIAGNOSIS — D72829 Elevated white blood cell count, unspecified: Secondary | ICD-10-CM

## 2016-10-05 DIAGNOSIS — I10 Essential (primary) hypertension: Secondary | ICD-10-CM | POA: Diagnosis not present

## 2016-10-05 DIAGNOSIS — F17200 Nicotine dependence, unspecified, uncomplicated: Secondary | ICD-10-CM

## 2016-10-05 DIAGNOSIS — G4733 Obstructive sleep apnea (adult) (pediatric): Secondary | ICD-10-CM | POA: Diagnosis not present

## 2016-10-05 DIAGNOSIS — G43009 Migraine without aura, not intractable, without status migrainosus: Secondary | ICD-10-CM

## 2016-10-05 LAB — POCT GLYCOSYLATED HEMOGLOBIN (HGB A1C): Hemoglobin A1C: 10.8

## 2016-10-05 MED ORDER — ALBUTEROL SULFATE HFA 108 (90 BASE) MCG/ACT IN AERS: 2.0000 | INHALATION_SPRAY | Freq: Four times a day (QID) | RESPIRATORY_TRACT | 0 refills | Status: DC | PRN

## 2016-10-05 MED ORDER — INSULIN PEN NEEDLE 30G X 8 MM MISC: 1.0000 | 0 refills | Status: DC | PRN

## 2016-10-05 MED ORDER — HYDROCODONE-ACETAMINOPHEN 10-325 MG PO TABS: 1.0000 | ORAL_TABLET | Freq: Four times a day (QID) | ORAL | 0 refills | Status: DC | PRN

## 2016-10-05 MED ORDER — INSULIN DEGLUDEC-LIRAGLUTIDE 100-3.6 UNIT-MG/ML ~~LOC~~ SOPN: 5.0000 [IU] | PEN_INJECTOR | Freq: Every day | SUBCUTANEOUS | 0 refills | Status: DC

## 2016-10-05 NOTE — Progress Notes (Signed)
Name: Barbara Bowen   MRN: [id number]    DOB: July 01, 1955   Date:10/05/2016       Progress Note  Subjective  Chief Complaint  Chief Complaint  Patient presents with  . Diabetes    checks 2-3 times daily 140 high  . Insomnia  . Hypertension  . Hyperlipidemia  . COPD    HPI  DMII: with vascular disease and diabetic nephropathy and dyslipidemia.  She was started on Metformin and Tanzeum in 2017 because hgbA1C spiked to above 8. She has not been very compliant with her diet ( still eating doughnuts, cakes, cookies and brownies ), we switched her to [place] back in December, but she developed nausea and also had hypoglycemic episodes and stopped all medications, including statin therapy. She states glucose was going down to 50. She did not call our office and I was not aware of her problems. She has been not taking any medications for DM of cholesterol at this time. Also stopped Diovan. FSBS at home is 140 post-prandially, but hgbA1C is very high. She is unable to exercise because of COPD/CAD. She denies polyphagia, polyuria but she has polydipsia. She has not been taking Diovan, Crestor but is back on Zetia,  she has been on  plavix and aspirin daily. . She is willing to resume Xultophy but starting at lower dose until she can see Endo  CAD: last cath done by Dr. Rockey Situ was in 2016 , stents still open, she is on medication managements at this time. She states she uses NTG patch prn, off NTG orally  HTN: taking betablocker, but off Diovan ( stopped on her own)  Intermittent chest pain but palpitation is controlled with Bystolic. SOB secondary to COPD, but refuses to take inhalers for it.  Migraine headaches: She is taking care of her mother - she fell and she has been living with her in [place] She has been more stressed. Seen by neurologist but per patient can't tolerate any prophylactic medication, the only thing that works for her is hydrodocone prn, 30 pills to last 90 days. She  tried Neurontin on her last visit but stopped on her own, states made her feel groggy. She has an average of 3 episodes per week. Pain is usually left frontal and radiates to occipital area, associated with nausea, no vomiting, photophobia and phonophobia.  Insomnia: she denies depression or anxiety, we tried her on Seroquel but she states it caused her nightmares.   Hyperlipidemia: she is on Zetia now, not on statin, can't tolerate it  COPD/Asthma : allergic to Spiriva, uses nocturnal oxygen, still smoking, has daily symptoms ( SOB and cough) . She refuses taking any medication. Symbicort and Advair caused palpitation, Spiriva caused migraine, she is only on Proair prn. Seen by local pulmonologist and advised to go to [facility] but she is afraid to drive there, she would like to go back to Dr. Raul Del  OSA: had a CPAP machine at home, but had problems getting oxygen with machine so they took it away, we sent her for another study but she could not sleep.   Afib: denies palpitation , she has intermittent chest pain. On plavix and Bystolic. Sees Dr. Rockey Situ   Patient Active Problem List   Diagnosis Date Noted  . Dyslipidemia associated with type 2 diabetes mellitus ([place]) 06/28/2016  . Spinal stenosis 10/15/2015  . Bilateral tinnitus 08/23/2015  . B12 deficiency 03/31/2015  . OSA (obstructive sleep apnea) 03/31/2015  . 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 Intertrigo 02/23/2015  . Coronary artery disease   . Insomnia 12/29/2014  . Migraine without aura and without status migrainosus, not intractable 12/29/2014  . Leukocytosis 12/24/2014  . Macrocytic 12/24/2014  . Unstable angina ([place])   . Essential hypertension   . Paroxysmal atrial fibrillation (HCC)   . Diabetes mellitus with renal manifestations, uncontrolled ([place])   . History of MI (myocardial infarction) 10/30/2013  . Benign neoplasm of breast 09/12/2012  . Nocturnal hypoxemia due to  obesity 09/09/2012  . COPD (chronic obstructive pulmonary disease) ([place]) 09/09/2012  . Smoking 03/27/2011  . Hyperlipidemia 10/28/2009  . CAD (coronary artery disease), native coronary artery 10/28/2009  . Shortness of breath 10/28/2009  . Angina pectoris ([place]) 10/28/2009    Past Surgical History:  Procedure Laterality Date  . ABDOMINAL HYSTERECTOMY    . APPENDECTOMY    . BREAST SURGERY Right 06-09-2012   biopsy x2 9 oclock and 11 oclock  . CARDIAC CATHETERIZATION  2009   s/p right coronary artery drug-eluting stent  . CARDIAC CATHETERIZATION N/A 12/16/2014   Procedure: Left Heart Cath and Coronary Angiography;  Surgeon: Minna Merritts, MD;  Location: [facility];  Service: Cardiovascular;  Laterality: N/A;  . CORONARY ANGIOPLASTY  2015  . exploratory thoracotomy    . MOLE REMOVAL      Family History  Problem Relation Age of Onset  . Heart disease Father        CAD  . Breast cancer Maternal Aunt   . Coronary artery disease Mother   . Diabetes type II Mother     Social History   Social History  . Marital status: Married    Spouse name: N/A  . Number of children: N/A  . Years of education: N/A   Occupational History  . unemployed [employer]   Social History Main Topics  . Smoking status: Current Every Day Smoker    Packs/day: 2.00    Years: 37.00    Types: Cigarettes    Start date: 02/01/1978  . Smokeless tobacco: Never Used  . Alcohol use No  . Drug use: No  . Sexual activity: Not Currently   Other Topics Concern  . Not on file   Social History Narrative   Lives in [place] with husband.  Does not work.  Does not routinely exercise.     Current Outpatient Prescriptions:  .  albuterol (PROAIR HFA) 108 (90 Base) MCG/ACT inhaler, Inhale 2 puffs into the lungs every 6 (six) hours as needed., Disp: 1 Inhaler, Rfl: 0 .  aspirin 81 MG EC tablet, Take 81 mg by mouth daily. , Disp: , Rfl:  .  clopidogrel (PLAVIX) 75 MG tablet, TAKE 1 TABLET (75 MG TOTAL) BY  MOUTH DAILY., Disp: 90 tablet, Rfl: 3 .  ezetimibe (ZETIA) 10 MG tablet, Take 1 tablet (10 mg total) by mouth daily., Disp: 90 tablet, Rfl: 3 .  glucose blood (ONE TOUCH ULTRA TEST) test strip, Use as instructed, Disp: 100 each, Rfl: 12 .  HYDROcodone-acetaminophen (NORCO) 10-325 MG tablet, Take 1 tablet by mouth every 6 (six) hours as needed. Take 1 tablet daily as needed for headaches, Disp: 30 tablet, Rfl: 0 .  Insulin Degludec-Liraglutide (XULTOPHY) 100-3.6 UNIT-MG/ML SOPN, Inject 5-50 Units into the skin daily., Disp: 9 mL, Rfl: 0 .  ketoconazole (NIZORAL) 2 % cream, Apply 1 application topically 2 (two) times daily. Mix with hydrocortison 10 otc ( patient will buy it otc ), Disp: 60 g, Rfl: 2 .  Lancets (ONETOUCH ULTRASOFT) lancets, Use as instructed, Disp: 100 each, Rfl: 12 .  nebivolol (BYSTOLIC) 10 MG tablet, Take 1 tablet (10 mg total) by mouth daily., Disp: 90 tablet, Rfl: 3 .  nitroGLYCERIN (NITRO-DUR) 0.2 mg/hr patch, Place 1 patch (0.2 mg total) onto the skin daily., Disp: 30 patch, Rfl: 6 .  rosuvastatin (CRESTOR) 40 MG tablet, Take 1 tablet (40 mg total) by mouth daily., Disp: 90 tablet, Rfl: 3  Allergies  Allergen Reactions  . Amoxicillin-Pot Clavulanate     Vomiting   . Cefprozil     Vomiting   . Eggs Or Egg-Derived Products     Hives   . Esomeprazole Magnesium     unknown  . Montelukast Sodium     unknown  . Omeprazole     unknown  . Penicillins     Vomiting blood  . Prednisone     Rapid heart beat & difficulty breathing  . Spiriva [Tiotropium Bromide Monohydrate]     HA  . Advair Diskus [Fluticasone-Salmeterol] Palpitations    Also happened with Symbicort     ROS  Constitutional: Negative for fever or weight change.  Respiratory: Positive  for cough and shortness of breath.   Cardiovascular: Positive  for chest pain but no palpitations.  Gastrointestinal: Negative for abdominal pain, no bowel changes.  Musculoskeletal: Negative for gait problem or joint  swelling.  Skin: Negative for rash.  Neurological: Negative for dizziness, positive for intermittent  headache.  No other specific complaints in a complete review of systems (except as listed in HPI above).  Objective  Vitals:   10/05/16 1039  BP: 122/64  Pulse: 83  Resp: 16  Temp: 98.6 F (37 C)  SpO2: 96%  Weight: 197 lb 6 oz (89.5 kg)  Height: 5\' 1"  (1.549 m)    Body mass index is 37.29 kg/m.  Physical Exam   Constitutional: Patient appears well-developed and well-nourished. Obese No distress.  HEENT: head atraumatic, normocephalic, pupils equal and reactive to light, neck supple, throat within normal limits Cardiovascular: Normal rate, regular rhythm and normal heart sounds. No murmur heard. No BLE edema. Pulmonary/Chest: Effort normal and breath sounds normal. No respiratory distress.  Abdominal: Soft. There is no tenderness. Psychiatric: Patient has a normal mood and affect. behavior is normal. Judgment and thought content normal.  Recent Results (from the past 2160 hour(s))  POCT HgB A1C     Status: Abnormal   Collection Time: 10/05/16 11:01 AM  Result Value Ref Range   Hemoglobin A1C 10.8      PHQ2/9: Depression screen [facility] 2/9 06/28/2016 03/29/2016 11/03/2015 09/30/2015 08/23/2015  Decreased Interest 0 0 0 0 0  Down, Depressed, Hopeless 0 0 0 0 0  PHQ - 2 Score 0 0 0 0 0     Fall Risk: Fall Risk  06/28/2016 03/29/2016 11/03/2015 09/30/2015 08/23/2015  Falls in the past year? Yes Yes Yes Yes Yes  Number falls in past yr: 2 or more 1 1 1 1   Injury with Fall? Yes No No No No  Follow up - Falls evaluation completed - - -     Assessment & Plan   1. Uncontrolled type 2 diabetes mellitus with diabetic nephropathy, unspecified whether long term insulin use ([place])  She is afraid of taking any medication, we will try low dose of Xultophy to titrate up very slowly to avoid hypoglycemia - POCT HgB A1C - Ambulatory referral to Endocrinology - Insulin Degludec-Liraglutide  (XULTOPHY) 100-3.6 UNIT-MG/ML SOPN; Inject 5-50 Units into  the skin daily.  Dispense: 9 mL; Refill: 0  2. Dyslipidemia associated with type 2 diabetes mellitus (HCC)  - Lipid panel  3. OSA (obstructive sleep apnea)  Not using CPAP - she states the company has not gotten orders - we will contact [facility] again   4. Unstable angina (HCC)  Continue follow up with Dr. Rockey Situ , on Zetia, plavix and aspirin now  5. Other insomnia  Still present, she wants to have BZD, but explained not recommended with other medications and risk of hypoxia, advised referral to psychiatrist since she goes days without medication and has symptoms of anxiety, but she refuses because she does not want to lose ability to carry her gun  6. COPD with asthma ([place])  Not on maintenance medication, only using rescue, we will refer her back to Dr. Raul Del - Ambulatory referral to Pulmonology  7. Essential hypertension  - COMPLETE METABOLIC PANEL WITH GFR  8. Smoking  Needs to quit smoking  9. Migraine without aura and without status migrainosus, not intractable  - HYDROcodone-acetaminophen (NORCO) 10-325 MG tablet; Take 1 tablet by mouth every 6 (six) hours as needed. Take 1 tablet daily as needed for headaches  Dispense: 30 tablet; Refill: 0  10. Leukocytosis, unspecified type  Last WBC slightly better, seen by hematologist in the past

## 2016-10-05 NOTE — Addendum Note (Signed)
Addended by: Inda Coke on: 10/05/2016 11:36 AM   Modules accepted: Orders

## 2016-11-19 ENCOUNTER — Telehealth: Payer: Self-pay

## 2016-11-19 DIAGNOSIS — E1165 Type 2 diabetes mellitus with hyperglycemia: Principal | ICD-10-CM

## 2016-11-19 DIAGNOSIS — E1121 Type 2 diabetes mellitus with diabetic nephropathy: Secondary | ICD-10-CM

## 2016-11-19 NOTE — Telephone Encounter (Signed)
Patient needs a new referral for [facility].

## 2016-11-27 ENCOUNTER — Telehealth: Payer: Self-pay | Admitting: Family Medicine

## 2016-11-27 NOTE — Telephone Encounter (Signed)
ERRENOUS °

## 2016-12-10 ENCOUNTER — Ambulatory Visit (INDEPENDENT_AMBULATORY_CARE_PROVIDER_SITE_OTHER): Payer: Medicaid Other | Admitting: Podiatry

## 2016-12-10 DIAGNOSIS — B351 Tinea unguium: Secondary | ICD-10-CM

## 2016-12-10 DIAGNOSIS — B353 Tinea pedis: Secondary | ICD-10-CM

## 2016-12-10 DIAGNOSIS — M79676 Pain in unspecified toe(s): Secondary | ICD-10-CM

## 2016-12-10 DIAGNOSIS — M79609 Pain in unspecified limb: Principal | ICD-10-CM

## 2016-12-10 DIAGNOSIS — E1159 Type 2 diabetes mellitus with other circulatory complications: Secondary | ICD-10-CM

## 2016-12-10 NOTE — Progress Notes (Signed)
   Subjective:    Patient ID: Barbara Bowen, female    DOB: 18-Jun-1955, 61 y.o.   MRN: [id number]  HPI this patient presents the office with chief complaint of long thick painful nails  . She says the nails are painful walking and wearing her shoes.  She says she had her nails previously treated at the nail salon, but they have refused to do her nails since they have grown so thick.  This patient is diabetic with angiopathy noted.  She also believes that she has athlete's foot which is causing her skin to peeling flake.  She presents the office today for an evaluation and treatment of her diabetic feet.    Review of Systems     Objective:   Physical Exam GENERAL APPEARANCE: Alert, conversant. Appropriately groomed. No acute distress.  VASCULAR: Pedal pulses are weakly   palpable at  [facility] and PT bilateral.  Capillary refill time is immediate to all digits,  Normal temperature gradient.   NEUROLOGIC: sensation is diminished  to 5.07 monofilament at 5/5 sites bilateral.  Light touch is diminished  bilateral, Muscle strength normal.  MUSCULOSKELETAL: acceptable muscle strength, tone and stability bilateral.  Intrinsic muscluature intact bilateral.  Rectus appearance of foot and digits noted bilateral.  NAILS   Thick disfigured discolored nails both feet.   No evidence of bacterial infection or drainage. DERMATOLOGIC: skin color, texture, and turgor are within normal limits.  No preulcerative lesions or ulcers  are seen, no interdigital maceration noted.  No open lesions present.   No drainage noted. Dry scaly plantar skin with no evidence of acute skin lesions.         Assessment & Plan:  Onychomycosis  B/L  Diabetes with vascular disease  Tinea pedis.  IE     Debridement of long thick nails.  Recommend usage lamisil spray for her plantar  feet.RTC 3 months.   Gardiner Barefoot DPM

## 2017-01-03 ENCOUNTER — Ambulatory Visit: Payer: Medicaid Other | Admitting: Family Medicine

## 2017-01-04 ENCOUNTER — Ambulatory Visit: Payer: Medicaid Other | Admitting: Family Medicine

## 2017-01-11 ENCOUNTER — Encounter: Payer: Self-pay | Admitting: Family Medicine

## 2017-01-11 ENCOUNTER — Ambulatory Visit (INDEPENDENT_AMBULATORY_CARE_PROVIDER_SITE_OTHER): Payer: Medicaid Other | Admitting: Family Medicine

## 2017-01-11 VITALS — BP 118/64 | HR 80 | Temp 97.8°F | Resp 16 | Wt 196.4 lb

## 2017-01-11 DIAGNOSIS — D72829 Elevated white blood cell count, unspecified: Secondary | ICD-10-CM

## 2017-01-11 DIAGNOSIS — M6530 Trigger finger, unspecified finger: Secondary | ICD-10-CM

## 2017-01-11 DIAGNOSIS — J449 Chronic obstructive pulmonary disease, unspecified: Secondary | ICD-10-CM

## 2017-01-11 DIAGNOSIS — G43009 Migraine without aura, not intractable, without status migrainosus: Secondary | ICD-10-CM

## 2017-01-11 DIAGNOSIS — E1169 Type 2 diabetes mellitus with other specified complication: Secondary | ICD-10-CM | POA: Diagnosis not present

## 2017-01-11 DIAGNOSIS — I10 Essential (primary) hypertension: Secondary | ICD-10-CM

## 2017-01-11 DIAGNOSIS — E1121 Type 2 diabetes mellitus with diabetic nephropathy: Secondary | ICD-10-CM | POA: Diagnosis not present

## 2017-01-11 DIAGNOSIS — E1165 Type 2 diabetes mellitus with hyperglycemia: Secondary | ICD-10-CM | POA: Diagnosis not present

## 2017-01-11 DIAGNOSIS — E785 Hyperlipidemia, unspecified: Secondary | ICD-10-CM

## 2017-01-11 DIAGNOSIS — I200 Unstable angina: Secondary | ICD-10-CM

## 2017-01-11 DIAGNOSIS — E538 Deficiency of other specified B group vitamins: Secondary | ICD-10-CM | POA: Diagnosis not present

## 2017-01-11 DIAGNOSIS — E559 Vitamin D deficiency, unspecified: Secondary | ICD-10-CM | POA: Diagnosis not present

## 2017-01-11 DIAGNOSIS — F17200 Nicotine dependence, unspecified, uncomplicated: Secondary | ICD-10-CM

## 2017-01-11 DIAGNOSIS — Z23 Encounter for immunization: Secondary | ICD-10-CM

## 2017-01-11 DIAGNOSIS — J4489 Other specified chronic obstructive pulmonary disease: Secondary | ICD-10-CM

## 2017-01-11 DIAGNOSIS — G4733 Obstructive sleep apnea (adult) (pediatric): Secondary | ICD-10-CM

## 2017-01-11 DIAGNOSIS — IMO0001 Reserved for inherently not codable concepts without codable children: Secondary | ICD-10-CM

## 2017-01-11 LAB — POCT GLYCOSYLATED HEMOGLOBIN (HGB A1C): HEMOGLOBIN A1C: 13.2

## 2017-01-11 MED ORDER — INSULIN PEN NEEDLE 30G X 8 MM MISC: 1.0000 | 0 refills | Status: DC | PRN

## 2017-01-11 MED ORDER — HYDROCODONE-ACETAMINOPHEN 10-325 MG PO TABS: 1.0000 | ORAL_TABLET | Freq: Four times a day (QID) | ORAL | 0 refills | Status: DC | PRN

## 2017-01-11 MED ORDER — INSULIN DEGLUDEC-LIRAGLUTIDE 100-3.6 UNIT-MG/ML ~~LOC~~ SOPN: 16.0000 [IU] | PEN_INJECTOR | Freq: Every day | SUBCUTANEOUS | 0 refills | Status: DC

## 2017-01-11 NOTE — Progress Notes (Signed)
Name: Barbara Bowen   MRN: [id number]    DOB: Sep 16, 1955   Date:01/11/2017       Progress Note  Subjective  Chief Complaint  Chief Complaint  Patient presents with  . Diabetes    3 month follow up pt not checking blood sugars  . Obesity  . Sleep Apnea  . Arthritis    right hand middle finger   . Flu Vaccine  . COPD    pt has appt with pulmonologist Monday    HPI  DMII: with vascular disease and diabetic nephropathy and dyslipidemia. She was started on Metformin and Tan[place]um in 2017 because hgbA1C spiked to above 8. She has been avoiding sweets since last visit,  we switched her to [place] back in December, but she developed nausea and also had hypoglycemic episodes and stopped all medications, including statin therapy. She continues to skip meals, hgbA1C is above 13 - she has an appointment scheduled with Endo in 2 weeks, but explained that we should try to resume Xultophy ( at least samples) until she is seen, she will start low and titrate up as tolerated. She is unable to exercise because of COPD/CAD. She denies polyphagia, polyuria but she has noticed  polydipsia. She has not been taking  Crestor but is taking Zetia,  she has been on plavix and aspirin daily. She is willing to resume Xultophy but starting at lower dose until she can see Endo  CAD: last cath done by Dr. Rockey Situ was in 2016 , stents still open, she is on medication managements at this time, and needs to resume Crestor. She states she uses NTG patch prn, and NTG orally prn  HTN: taking betablocker, but off Diovan ( stopped on her own)  Intermittent chest pain ( but seldom and mild) but palpitation is controlled with Bystolic. SOB secondary to COPD, but refuses to take inhalers for it.  Migraine headaches: She is taking care of her mother - she fell and she has been living with her in [place] She has been more stressed. Seen by neurologist but per patient can't tolerate any prophylactic medication, the only  thing that works for her is hydrodocone prn, 30 pills to last 90 days. She tried Neurontin on her last visit but stopped on her own, states made her feel groggy. She has an average of 3 episodes per week. Pain is usually left frontal and radiates to occipital area, associated with nausea, no vomiting, photophobia and phonophobia.Discussed new medications ( injectable) but she is worried about possible side effect  Insomnia: she denies depression or anxiety, we tried her on Seroquel but she states it caused her nightmares.   Hyperlipidemia: she is on Zetia now, but willing to resume statin and get labs done today   COPD/Asthma : allergic to Spiriva, uses nocturnal oxygen, still smoking, has daily symptoms ( SOB and cough) . She refuses taking any medication. Symbicort and Advair caused palpitation, Spiriva caused migraine, she is only on Proair prn. Seen by local pulmonologist and advised to go to [facility] but she is afraid to drive there, she would like to go back to Dr. Raul Del  OSA: had a CPAP machine at home, but had problems getting oxygen with machine so they took it away, we sent her for another study but she could not sleep.   Afib: denies palpitation , she has intermittent chest pain. On plavix and Bystolic. Sees Dr. Rockey Situ  Trigger finger: she has noticed that right middle finger is flexed when she  wakes up in am, and is painful to straighten it back up. Discussed holding off on steroid injection since glucose is so high for now  Patient Active Problem List   Diagnosis Date Noted  . Dyslipidemia associated with type 2 diabetes mellitus ([place]) 06/28/2016  . Spinal stenosis 10/15/2015  . Bilateral tinnitus 08/23/2015  . B12 deficiency 03/31/2015  . OSA (obstructive sleep apnea) 03/31/2015  . 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 Intertrigo 02/23/2015  . Coronary artery disease   . Insomnia 12/29/2014  . Migraine without aura and without  status migrainosus, not intractable 12/29/2014  . Leukocytosis 12/24/2014  . Macrocytic 12/24/2014  . Unstable angina ([place])   . Essential hypertension   . Paroxysmal atrial fibrillation (HCC)   . Diabetes mellitus with renal manifestations, uncontrolled ([place])   . History of MI (myocardial infarction) 10/30/2013  . Benign neoplasm of breast 09/12/2012  . Nocturnal hypoxemia due to obesity 09/09/2012  . COPD (chronic obstructive pulmonary disease) ([place]) 09/09/2012  . Smoking 03/27/2011  . Hyperlipidemia 10/28/2009  . CAD (coronary artery disease), native coronary artery 10/28/2009  . Shortness of breath 10/28/2009  . Angina pectoris ([place]) 10/28/2009    Past Surgical History:  Procedure Laterality Date  . ABDOMINAL HYSTERECTOMY    . APPENDECTOMY    . BREAST SURGERY Right 06-09-2012   biopsy x2 9 oclock and 11 oclock  . CARDIAC CATHETERIZATION  2009   s/p right coronary artery drug-eluting stent  . CARDIAC CATHETERIZATION N/A 12/16/2014   Procedure: Left Heart Cath and Coronary Angiography;  Surgeon: Minna Merritts, MD;  Location: [facility];  Service: Cardiovascular;  Laterality: N/A;  . CORONARY ANGIOPLASTY  2015  . exploratory thoracotomy    . MOLE REMOVAL      Family History  Problem Relation Age of Onset  . Heart disease Father        CAD  . Breast cancer Maternal Aunt   . Coronary artery disease Mother   . Diabetes type II Mother     Social History   Social History  . Marital status: Married    Spouse name: N/A  . Number of children: N/A  . Years of education: N/A   Occupational History  . unemployed [employer]   Social History Main Topics  . Smoking status: Current Every Day Smoker    Packs/day: 2.00    Years: 37.00    Types: Cigarettes    Start date: 02/01/1978  . Smokeless tobacco: Never Used  . Alcohol use No  . Drug use: No  . Sexual activity: Not Currently   Other Topics Concern  . Not on file   Social History Narrative   Lives in  [place] with husband.  Does not work.  Does not routinely exercise.     Current Outpatient Prescriptions:  .  albuterol (PROAIR HFA) 108 (90 Base) MCG/ACT inhaler, Inhale 2 puffs into the lungs every 6 (six) hours as needed., Disp: 1 Inhaler, Rfl: 0 .  aspirin 81 MG EC tablet, Take 81 mg by mouth daily. , Disp: , Rfl:  .  clopidogrel (PLAVIX) 75 MG tablet, TAKE 1 TABLET (75 MG TOTAL) BY MOUTH DAILY., Disp: 90 tablet, Rfl: 3 .  ezetimibe (ZETIA) 10 MG tablet, Take 1 tablet (10 mg total) by mouth daily., Disp: 90 tablet, Rfl: 3 .  glucose blood (ONE TOUCH ULTRA TEST) test strip, Use as instructed, Disp: 100 each, Rfl: 12 .  HYDROcodone-acetaminophen (NORCO) 10-325 MG tablet, Take  1 tablet by mouth every 6 (six) hours as needed. Take 1 tablet daily as needed for headaches, Disp: 30 tablet, Rfl: 0 .  Insulin Degludec-Liraglutide (XULTOPHY) 100-3.6 UNIT-MG/ML SOPN, Inject 16-50 Units into the skin daily., Disp: 9 mL, Rfl: 0 .  Insulin Pen Needle (NOVOFINE) 30G X 8 MM MISC, Inject 10 each into the skin as needed., Disp: 100 each, Rfl: 0 .  ketoconazole (NIZORAL) 2 % cream, Apply 1 application topically 2 (two) times daily. Mix with hydrocortison 10 otc ( patient will buy it otc ), Disp: 60 g, Rfl: 2 .  Lancets (ONETOUCH ULTRASOFT) lancets, Use as instructed, Disp: 100 each, Rfl: 12 .  nebivolol (BYSTOLIC) 10 MG tablet, Take 1 tablet (10 mg total) by mouth daily., Disp: 90 tablet, Rfl: 3 .  nitroGLYCERIN (NITRO-DUR) 0.2 mg/hr patch, Place 1 patch (0.2 mg total) onto the skin daily., Disp: 30 patch, Rfl: 6 .  rosuvastatin (CRESTOR) 40 MG tablet, Take 1 tablet (40 mg total) by mouth daily., Disp: 90 tablet, Rfl: 3  Allergies  Allergen Reactions  . Amoxicillin-Pot Clavulanate     Vomiting   . Cefprozil     Vomiting   . Eggs Or Egg-Derived Products     Hives   . Esomeprazole Magnesium     unknown  . Montelukast Sodium     unknown  . Omeprazole     unknown  . Penicillins     Vomiting blood  .  Prednisone     Rapid heart beat & difficulty breathing  . Spiriva [Tiotropium Bromide Monohydrate]     HA  . Advair Diskus [Fluticasone-Salmeterol] Palpitations    Also happened with Symbicort     ROS  Constitutional: Negative for fever or weight change.  Respiratory: Positive for chronic  cough and shortness of breath.   Cardiovascular: Negative for chest pain or palpitations.  Gastrointestinal: Negative for abdominal pain, no bowel changes.  Musculoskeletal: Negative for gait problem or joint swelling.  Skin: Negative for rash.  Neurological: Negative for dizziness , positive for intermittent headache.  No other specific complaints in a complete review of systems (except as listed in HPI above).  Objective  Vitals:   01/11/17 1128  BP: 118/64  Pulse: 80  Resp: 16  Temp: 97.8 F (36.6 C)  SpO2: 94%  Weight: 196 lb 7 oz (89.1 kg)    Body mass index is 37.12 kg/m.  Physical Exam  Constitutional: Patient appears well-developed and well-nourished. Obese No distress.  HEENT: head atraumatic, normocephalic, pupils equal and reactive to light, neck supple, throat within normal limits Cardiovascular: Normal rate, regular rhythm and normal heart sounds. No murmur heard. No BLE edema. Pulmonary/Chest: Effort normal and breath sounds normal. No respiratory distress.  Abdominal: Soft. There is no tenderness. Skin: three red dots on right inner leg, no oozing, about 5 mm in size Psychiatric: Patient has a normal mood and affect. behavior is normal. Judgment and thought content normal.  Recent Results (from the past 2160 hour(s))  POCT HgB A1C     Status: Abnormal   Collection Time: 01/11/17 11:42 AM  Result Value Ref Range   Hemoglobin A1C 13.2     Diabetic Foot Exam: Diabetic Foot Exam - Simple   Simple Foot Form Diabetic Foot exam was performed with the following findings:  Yes 01/11/2017 12:04 PM  Visual Inspection See comments:  Yes Sensation Testing Intact to  touch and monofilament testing bilaterally:  Yes Pulse Check Posterior Tibialis and Dorsalis pulse intact bilaterally:  Yes Comments Thick toenails, recently trimmed by podiatrist      PHQ2/9: Depression screen [facility] 2/9 06/28/2016 03/29/2016 11/03/2015 09/30/2015 08/23/2015  Decreased Interest 0 0 0 0 0  Down, Depressed, Hopeless 0 0 0 0 0  PHQ - 2 Score 0 0 0 0 0     Fall Risk: Fall Risk  06/28/2016 03/29/2016 11/03/2015 09/30/2015 08/23/2015  Falls in the past year? Yes Yes Yes Yes Yes  Comment - - accidentally patient off her step accidentally patient off her step accidentally patient off her step  Number falls in past yr: 2 or more 1 1 1 1   Injury with Fall? Yes No No No No  Follow up - Falls evaluation completed - - -     Assessment & Plan  1. Uncontrolled type 2 diabetes mellitus with diabetic nephropathy, unspecified whether long term insulin use ([place])  She has an appointment with Endocrinologist in 2 weeks, she has not been using any medication  - POCT HgB A1C - Insulin Degludec-Liraglutide (XULTOPHY) 100-3.6 UNIT-MG/ML SOPN; Inject 16-50 Units into the skin daily.  Dispense: 9 mL; Refill: 0 - Insulin Pen Needle (NOVOFINE) 30G X 8 MM MISC; Inject 10 each into the skin as needed.  Dispense: 100 each; Refill: 0 - Microalbumin / creatinine urine ratio  2. Dyslipidemia associated with type 2 diabetes mellitus (HCC)  - Insulin Degludec-Liraglutide (XULTOPHY) 100-3.6 UNIT-MG/ML SOPN; Inject 16-50 Units into the skin daily.  Dispense: 9 mL; Refill: 0 - Insulin Pen Needle (NOVOFINE) 30G X 8 MM MISC; Inject 10 each into the skin as needed.  Dispense: 100 each; Refill: 0 - Lipid panel  3. OSA (obstructive sleep apnea)  She uses oxygen, but not CPAP   4. Unstable angina (HCC)  Sees Dr Rockey Situ   5. COPD with asthma ([place])  Going to pulmonologist  6. Essential hypertension  - COMPLETE METABOLIC PANEL WITH GFR  7. Smoking  Discussed again importance of quitting smoking  8.  Migraine without aura and without status migrainosus, not intractable  - HYDROcodone-acetaminophen (NORCO) 10-325 MG tablet; Take 1 tablet by mouth every 6 (six) hours as needed. Take 1 tablet daily as needed for headaches  Dispense: 30 tablet; Refill: 0  9. Needs flu shot  - Flu Vaccine QUAD 36+ mos IM  10. Vitamin D deficiency  - VITAMIN D 25 Hydroxy (Vit-D Deficiency, Fractures)  11. Leukocytosis, unspecified type  - CBC with Differential/Platelet  12. B12 deficiency  - Vitamin B12

## 2017-01-11 NOTE — Patient Instructions (Signed)
Trigger Finger Trigger finger (stenosing tenosynovitis) is a condition that causes a finger to get stuck in a bent position. Each finger has a tough, cord-like tissue that connects muscle to bone (tendon), and each tendon is surrounded by a tunnel of tissue (tendon sheath). To move your finger, your tendon needs to slide freely through the sheath. Trigger finger happens when the tendon or the sheath thickens, making it difficult to move your finger. Trigger finger can affect any finger or a thumb. It may affect more than one finger. Mild cases may clear up with rest and medicine. Severe cases require more treatment. What are the causes? Trigger finger is caused by a thickened finger tendon or tendon sheath. The cause of this thickening is not known. What increases the risk? The following factors may make you more likely to develop this condition:  Doing activities that require a strong grip.  Having rheumatoid arthritis, gout, or diabetes.  Being 40-60 years old.  Being a woman.  What are the signs or symptoms? Symptoms of this condition include:  Pain when bending or straightening your finger.  Tenderness or swelling where your finger attaches to the palm of your hand.  A lump in the palm of your hand or on the inside of your finger.  Hearing a popping sound when you try to straighten your finger.  Feeling a popping, catching, or locking sensation when you try to straighten your finger.  Being unable to straighten your finger.  How is this diagnosed? This condition is diagnosed based on your symptoms and a physical exam. How is this treated? This condition may be treated by:  Resting your finger and avoiding activities that make symptoms worse.  Wearing a finger splint to keep your finger in a slightly bent position.  Taking NSAIDs to relieve pain and swelling.  Injecting medicine (steroids) into the tendon sheath to reduce swelling and irritation. Injections may need to be  repeated.  Having surgery to open the tendon sheath. This may be done if other treatments do not work and you cannot straighten your finger. You may need physical therapy after surgery.  Follow these instructions at home:  Use moist heat to help reduce pain and swelling as told by your health care provider.  Rest your finger and avoid activities that make pain worse. Return to normal activities as told by your health care provider.  If you have a splint, wear it as told by your health care provider.  Take over-the-counter and prescription medicines only as told by your health care provider.  Keep all follow-up visits as told by your health care provider. This is important. Contact a health care provider if:  Your symptoms are not improving with home care. Summary  Trigger finger (stenosing tenosynovitis) causes your finger to get stuck in a bent position, and it can make it difficult and painful to straighten your finger.  This condition develops when a finger tendon or tendon sheath thickens.  Treatment starts with resting, wearing a splint, and taking NSAIDs.  In severe cases, surgery to open the tendon sheath may be needed. This information is not intended to replace advice given to you by your health care provider. Make sure you discuss any questions you have with your health care provider. Document Released: 01/28/2004 Document Revised: 03/20/2016 Document Reviewed: 03/20/2016 Elsevier Interactive Patient Education  2017 [employer]  

## 2017-01-14 ENCOUNTER — Other Ambulatory Visit: Payer: Self-pay

## 2017-01-14 DIAGNOSIS — E1121 Type 2 diabetes mellitus with diabetic nephropathy: Secondary | ICD-10-CM

## 2017-01-14 DIAGNOSIS — E1165 Type 2 diabetes mellitus with hyperglycemia: Principal | ICD-10-CM

## 2017-01-14 LAB — VITAMIN B12: Vitamin B-12: 476 pg/mL (ref 200–1100)

## 2017-01-14 LAB — LIPID PANEL
CHOL/HDL RATIO: 5.2 (calc) — AB (ref <5.0)
CHOLESTEROL: 182 mg/dL (ref <200)
HDL: 35 mg/dL — ABNORMAL LOW (ref >50)
LDL Cholesterol (Calc): 115 mg/dL (calc) — ABNORMAL HIGH
NON-HDL CHOLESTEROL (CALC): 147 mg/dL — AB (ref <130)
Triglycerides: 204 mg/dL — ABNORMAL HIGH (ref <150)

## 2017-01-14 LAB — CBC WITH DIFFERENTIAL/PLATELET
BASOS ABS: 102 {cells}/uL (ref 0–200)
Basophils Relative: 0.8 %
EOS ABS: 229 {cells}/uL (ref 15–500)
Eosinophils Relative: 1.8 %
HCT: 47.7 % — ABNORMAL HIGH (ref 35.0–45.0)
HEMOGLOBIN: 16.2 g/dL — AB (ref 11.7–15.5)
Lymphs Abs: 4534 cells/uL — ABNORMAL HIGH (ref 850–3900)
MCH: 33.4 pg — AB (ref 27.0–33.0)
MCHC: 34 g/dL (ref 32.0–36.0)
MCV: 98.4 fL (ref 80.0–100.0)
MONOS PCT: 6.3 %
MPV: 10.8 fL (ref 7.5–12.5)
NEUTROS ABS: 7036 {cells}/uL (ref 1500–7800)
NEUTROS PCT: 55.4 %
Platelets: 243 10*3/uL (ref 140–400)
RBC: 4.85 10*6/uL (ref 3.80–5.10)
RDW: 11.6 % (ref 11.0–15.0)
Total Lymphocyte: 35.7 %
WBC mixed population: 800 cells/uL (ref 200–950)
WBC: 12.7 10*3/uL — ABNORMAL HIGH (ref 3.8–10.8)

## 2017-01-14 LAB — COMPLETE METABOLIC PANEL WITH GFR
AG Ratio: 1.1 (calc) (ref 1.0–2.5)
ALBUMIN MSPROF: 3.7 g/dL (ref 3.6–5.1)
ALT: 23 U/L (ref 6–29)
AST: 19 U/L (ref 10–35)
Alkaline phosphatase (APISO): 106 U/L (ref 33–130)
BILIRUBIN TOTAL: 0.4 mg/dL (ref 0.2–1.2)
BUN: 8 mg/dL (ref 7–25)
CO2: 28 mmol/L (ref 20–32)
Calcium: 9.5 mg/dL (ref 8.6–10.4)
Chloride: 95 mmol/L — ABNORMAL LOW (ref 98–110)
Creat: 0.75 mg/dL (ref 0.50–1.05)
GFR, EST AFRICAN AMERICAN: 102 mL/min/{1.73_m2} (ref >=60)
GFR, EST NON AFRICAN AMERICAN: 88 mL/min/{1.73_m2} (ref >=60)
Globulin: 3.4 g/dL (calc) (ref 1.9–3.7)
Glucose, Bld: 360 mg/dL — ABNORMAL HIGH (ref 65–99)
POTASSIUM: 4.1 mmol/L (ref 3.5–5.3)
Sodium: 132 mmol/L — ABNORMAL LOW (ref 135–146)
TOTAL PROTEIN: 7.1 g/dL (ref 6.1–8.1)

## 2017-01-14 LAB — MICROALBUMIN / CREATININE URINE RATIO
CREATININE, URINE: 50 mg/dL (ref 20–275)
Microalb Creat Ratio: 18 mcg/mg creat (ref <30)
Microalb, Ur: 0.9 mg/dL

## 2017-01-14 LAB — VITAMIN D 25 HYDROXY (VIT D DEFICIENCY, FRACTURES): VIT D 25 HYDROXY: 10 ng/mL — AB (ref 30–100)

## 2017-01-14 MED ORDER — BD ULTRA-FINE LANCETS MISC: 12 refills | Status: AC

## 2017-02-09 ENCOUNTER — Emergency Department
Admission: EM | Admit: 2017-02-09 | Discharge: 2017-02-09 | Disposition: A | Discharge status: Home / Self Care | Payer: Medicaid Other | Attending: Emergency Medicine | Admitting: Emergency Medicine

## 2017-02-09 ENCOUNTER — Encounter: Payer: Self-pay | Admitting: Emergency Medicine

## 2017-02-09 ENCOUNTER — Emergency Department: Payer: Medicaid Other

## 2017-02-09 DIAGNOSIS — Z7982 Long term (current) use of aspirin: Secondary | ICD-10-CM | POA: Insufficient documentation

## 2017-02-09 DIAGNOSIS — Y929 Unspecified place or not applicable: Secondary | ICD-10-CM | POA: Diagnosis not present

## 2017-02-09 DIAGNOSIS — Z7902 Long term (current) use of antithrombotics/antiplatelets: Secondary | ICD-10-CM | POA: Insufficient documentation

## 2017-02-09 DIAGNOSIS — S63642A Sprain of metacarpophalangeal joint of left thumb, initial encounter: Secondary | ICD-10-CM

## 2017-02-09 DIAGNOSIS — Y939 Activity, unspecified: Secondary | ICD-10-CM | POA: Diagnosis not present

## 2017-02-09 DIAGNOSIS — I110 Hypertensive heart disease with heart failure: Secondary | ICD-10-CM | POA: Insufficient documentation

## 2017-02-09 DIAGNOSIS — F17210 Nicotine dependence, cigarettes, uncomplicated: Secondary | ICD-10-CM | POA: Insufficient documentation

## 2017-02-09 DIAGNOSIS — W010XXA Fall on same level from slipping, tripping and stumbling without subsequent striking against object, initial encounter: Secondary | ICD-10-CM | POA: Diagnosis not present

## 2017-02-09 DIAGNOSIS — I509 Heart failure, unspecified: Secondary | ICD-10-CM | POA: Diagnosis not present

## 2017-02-09 DIAGNOSIS — I2510 Atherosclerotic heart disease of native coronary artery without angina pectoris: Secondary | ICD-10-CM | POA: Diagnosis not present

## 2017-02-09 DIAGNOSIS — S6992XA Unspecified injury of left wrist, hand and finger(s), initial encounter: Secondary | ICD-10-CM | POA: Diagnosis present

## 2017-02-09 DIAGNOSIS — S93492A Sprain of other ligament of left ankle, initial encounter: Secondary | ICD-10-CM

## 2017-02-09 DIAGNOSIS — Y999 Unspecified external cause status: Secondary | ICD-10-CM | POA: Insufficient documentation

## 2017-02-09 DIAGNOSIS — M25572 Pain in left ankle and joints of left foot: Secondary | ICD-10-CM | POA: Insufficient documentation

## 2017-02-09 MED ORDER — MELOXICAM 15 MG PO TABS: 15.0000 mg | ORAL_TABLET | Freq: Every day | ORAL | 0 refills | Status: DC

## 2017-02-09 NOTE — ED Provider Notes (Signed)
[facility] Emergency Department Provider Note  ____________________________________________  Time seen: Approximately 8:10 PM  I have reviewed the triage vital signs and the nursing notes.   HISTORY  Chief Complaint Hand Pain    HPI Barbara Bowen is a 61 y.o. female presents to emergency department complaining of left hand and left ankle pain status post fall 2 days prior. Patient reports that she tripped and fell landing on her left wrist as well as hurting her left ankle. Patient is able to ambulate on the left foot and ankle. She reports that she is having pain to the medial and anterior aspect of the left ankle. Patient reports that she hurts at the base of left thumb. No loss of range of motion to the thumb or any digit of the left hand.No other injury or complaint. Over-the-counter medications are not helping.   Past Medical History:  Diagnosis Date  . Anxiety   . CHF (congestive heart failure) ([place])   . COPD (chronic obstructive pulmonary disease) ([employer])   . Coronary artery disease    a. 09/2007 s/p PCI/DES to prox RCA;  b. 2012 Cath: nonobs dzs;  c. 01/2014 PCI: 95d (3.0x23 Xience [place]), RPDA 80 (PTCA), EF 60%;  d. 11/2014 Cath: LM anomalous, LAD/D1/D2 min irregs, LCX small, min irregs, OM2 small, RCA 20p ISR, 10d ISR, EF 65%.  . DM type 2 (diabetes mellitus, type 2) ([place])   . Enthesopathy of hip region   . Essential hypertension   . Headache   . Hx-TIA (transient ischemic attack)   . Hyperlipidemia   . Insomnia, unspecified   . Leukocytosis, unspecified   . Lipoma of other skin and subcutaneous tissue   . OA (osteoarthritis)   . Occlusion and stenosis of carotid artery without mention of cerebral infarction   . Palpitations   . Paroxysmal atrial fibrillation (HCC)    a. not appreciated on holter 09/2013;  b. CHA2DS2VASc = 5 (htn, DM, h/o TIA, vasc dzs, female) - not on [place].  Marland Kitchen Plantar fascial fibromatosis   . Proteinuria   . Stroke ([place])    . Tobacco use disorder    a. does not believe cigarettes contribute to her dyspnea/asthma.    Marland Kitchen Unspecified sleep apnea    a. Wears O2 via [place] @ [facility].  Marland Kitchen Unspecified tinnitus     Patient Active Problem List   Diagnosis Date Noted  . Dyslipidemia associated with type 2 diabetes mellitus ([place]) 06/28/2016  . Spinal stenosis 10/15/2015  . Bilateral tinnitus 08/23/2015  . B12 deficiency 03/31/2015  . OSA (obstructive sleep apnea) 03/31/2015  . 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 Intertrigo 02/23/2015  . Coronary artery disease   . Insomnia 12/29/2014  . Migraine without aura and without status migrainosus, not intractable 12/29/2014  . Leukocytosis 12/24/2014  . Macrocytic 12/24/2014  . Unstable angina ([place])   . Essential hypertension   . Paroxysmal atrial fibrillation (HCC)   . Diabetes mellitus with renal manifestations, uncontrolled ([place])   . History of MI (myocardial infarction) 10/30/2013  . Benign neoplasm of breast 09/12/2012  . Nocturnal hypoxemia due to obesity 09/09/2012  . COPD (chronic obstructive pulmonary disease) ([place]) 09/09/2012  . Smoking 03/27/2011  . Hyperlipidemia 10/28/2009  . CAD (coronary artery disease), native coronary artery 10/28/2009  . Shortness of breath 10/28/2009  . Angina pectoris ([place]) 10/28/2009    Past Surgical History:  Procedure Laterality Date  . ABDOMINAL HYSTERECTOMY    . APPENDECTOMY    .  BREAST SURGERY Right 06-09-2012   biopsy x2 9 oclock and 11 oclock  . CARDIAC CATHETERIZATION  2009   s/p right coronary artery drug-eluting stent  . CARDIAC CATHETERIZATION N/A 12/16/2014   Procedure: Left Heart Cath and Coronary Angiography;  Surgeon: Minna Merritts, MD;  Location: [place] [facility];  Service: Cardiovascular;  Laterality: N/A;  . CORONARY ANGIOPLASTY  2015  . exploratory thoracotomy    . MOLE REMOVAL      Prior to Admission medications   Medication Sig Start Date End Date Taking?  Authorizing Provider  albuterol (PROAIR HFA) 108 (90 Base) MCG/ACT inhaler Inhale 2 puffs into the lungs every 6 (six) hours as needed. 10/05/16   Steele Sizer, MD  aspirin 81 MG EC tablet Take 81 mg by mouth daily.     [provider]  BD ULTRA-FINE LANCETS lancets Use as instructed 01/14/17   Steele Sizer, MD  clopidogrel (PLAVIX) 75 MG tablet TAKE 1 TABLET (75 MG TOTAL) BY MOUTH DAILY. 08/24/16   Minna Merritts, MD  ezetimibe (ZETIA) 10 MG tablet Take 1 tablet (10 mg total) by mouth daily. 08/24/16   Minna Merritts, MD  glucose blood (ONE TOUCH ULTRA TEST) test strip Use as instructed 06/28/16   Steele Sizer, MD  HYDROcodone-acetaminophen (NORCO) 10-325 MG tablet Take 1 tablet by mouth every 6 (six) hours as needed. Take 1 tablet daily as needed for headaches 01/11/17   Steele Sizer, MD  Insulin Degludec-Liraglutide (XULTOPHY) 100-3.6 UNIT-MG/ML SOPN Inject 16-50 Units into the skin daily. 01/11/17   Steele Sizer, MD  Insulin Pen Needle (NOVOFINE) 30G X 8 MM MISC Inject 10 each into the skin as needed. 01/11/17   Steele Sizer, MD  ketoconazole (NIZORAL) 2 % cream Apply 1 application topically 2 (two) times daily. Mix with hydrocortison 10 otc ( patient will buy it otc ) 06/24/15   Sowles, Drue Stager, MD  meloxicam (MOBIC) 15 MG tablet Take 1 tablet (15 mg total) by mouth daily. 02/09/17   Candus Braud, Charline Bills, PA-C  nebivolol (BYSTOLIC) 10 MG tablet Take 1 tablet (10 mg total) by mouth daily. 08/24/16   Minna Merritts, MD  nitroGLYCERIN (NITRO-DUR) 0.2 mg/hr patch Place 1 patch (0.2 mg total) onto the skin daily. 01/04/15   Minna Merritts, MD  rosuvastatin (CRESTOR) 40 MG tablet Take 1 tablet (40 mg total) by mouth daily. 08/24/16   Minna Merritts, MD    Allergies Amoxicillin-pot clavulanate; Cefprozil; Eggs or egg-derived products; Esomeprazole magnesium; Montelukast sodium; Omeprazole; Penicillins; Prednisone; Spiriva [tiotropium bromide monohydrate]; and Advair diskus  [fluticasone-salmeterol]  Family History  Problem Relation Age of Onset  . Heart disease Father        CAD  . Breast cancer Maternal Aunt   . Coronary artery disease Mother   . Diabetes type II Mother     Social History Social History  Substance Use Topics  . Smoking status: Current Every Day Smoker    Packs/day: 2.00    Years: 37.00    Types: Cigarettes    Start date: 02/01/1978  . Smokeless tobacco: Never Used  . Alcohol use No     Review of Systems  Constitutional: No fever/chills Eyes: No visual changes. No discharge ENT: No upper respiratory complaints. Cardiovascular: no chest pain. Respiratory: no cough. No SOB. Gastrointestinal: No abdominal pain.  No nausea, no vomiting.   Musculoskeletal: positive for left thumb and left ankle pain Skin: Negative for rash, abrasions, lacerations, ecchymosis. Neurological: Negative for headaches, focal weakness  or numbness. 10-point ROS otherwise negative.  ____________________________________________   PHYSICAL EXAM:  VITAL SIGNS: ED Triage Vitals  Enc Vitals Group     BP 02/09/17 1836 107/74     Pulse Rate 02/09/17 1836 79     Resp 02/09/17 1836 15     Temp 02/09/17 1836 98.4 F (36.9 C)     Temp src --      SpO2 02/09/17 1836 96 %     Weight 02/09/17 1837 200 lb (90.7 kg)     Height 02/09/17 1837 5' (1.524 m)     Head Circumference --      Peak Flow --      Pain Score 02/09/17 1836 3     Pain Loc --      Pain Edu? --      Excl. in [place]? --      Constitutional: Alert and oriented. Well appearing and in no acute distress. Eyes: Conjunctivae are normal. PERRL. EOMI. Head: Atraumatic. Neck: No stridor.    Cardiovascular: Normal rate, regular rhythm. Normal S1 and S2.  Good peripheral circulation. Respiratory: Normal respiratory effort without tachypnea or retractions. Lungs CTAB. Good air entry to the bases with no decreased or absent breath sounds. Musculoskeletal: Full range of motion to all extremities. No  gross deformities appreciated.no appreciable edema, deformity,ecchymosis noted to the left hand. Full range of motion of all findings. Patient is tender to palpation over the MCP joint of the left thumb. No other tenderness to palpation. Cap refill intact all 5 digits. Sensation intact all 5 digits. No visible deformity or gross edema noted to the left ankle. Full range of motion. Patient is tender to palpation along the anterior talofibular ligament distribution. No palpable abnormality. Dorsalis pedis pulse intact. Sensation intact all 5 digits. Neurologic:  Normal speech and language. No gross focal neurologic deficits are appreciated.  Skin:  Skin is warm, dry and intact. No rash noted. Psychiatric: Mood and affect are normal. Speech and behavior are normal. Patient exhibits appropriate insight and judgement.   ____________________________________________   LABS (all labs ordered are listed, but only abnormal results are displayed)  Labs Reviewed - No data to display ____________________________________________  EKG   ____________________________________________  RADIOLOGY Diamantina Providence Ahmiyah Coil, personally viewed and evaluated these images (plain radiographs) as part of my medical decision making, as well as reviewing the written report by the radiologist.  Dg Ankle Complete Left  Result Date: 02/09/2017 CLINICAL DATA:  Recent fall with left ankle pain, initial encounter EXAM: LEFT ANKLE COMPLETE - 3+ VIEW COMPARISON:  None. FINDINGS: Soft tissue swelling is noted more prominent medially. No acute fracture or dislocation is noted. Mild calcaneal spurring is seen. IMPRESSION: Soft tissue swelling without acute bony abnormality. Electronically Signed   By: Inez Catalina M.D.   On: 02/09/2017 20:53   Dg Hand Complete Left  Result Date: 02/09/2017 CLINICAL DATA:  Fall 2 days ago with persistent hand pain, initial encounter EXAM: LEFT HAND - COMPLETE 3+ VIEW COMPARISON:  None. FINDINGS:  There is no evidence of fracture or dislocation. There is no evidence of arthropathy or other focal bone abnormality. Soft tissues are unremarkable. IMPRESSION: No acute abnormality noted. Electronically Signed   By: Inez Catalina M.D.   On: 02/09/2017 20:52    ____________________________________________    PROCEDURES  Procedure(s) performed:    Procedures    Medications - No data to display   ____________________________________________   INITIAL IMPRESSION / ASSESSMENT AND PLAN / ED COURSE  Pertinent  labs & imaging results that were available during my care of the patient were reviewed by me and considered in my medical decision making (see chart for details).  Review of the [place] CSRS was performed in accordance of the [place] prior to dispensing any controlled drugs.     Patient's diagnosis is consistent with sprain of the MCP joint of the left thumb as well as left ankle sprain. Differential included contusion versus strain versus fracture of the left thumb and left ankle. X-ray reveals no acute osseous abnormality. Exam is reassuring. Patient was given a removable Velcro wrist brace and Ace bandage for the left ankle.. Patient will be discharged home with prescriptions for meloxicam. Patient is to follow up with primary care as needed or otherwise directed. Patient is given ED precautions to return to the ED for any worsening or new symptoms.     ____________________________________________  FINAL CLINICAL IMPRESSION(S) / ED DIAGNOSES  Final diagnoses:  Sprain of metacarpophalangeal (MCP) joint of left thumb, initial encounter  Sprain of anterior talofibular ligament of left ankle, initial encounter      NEW MEDICATIONS STARTED DURING THIS VISIT:  New Prescriptions   MELOXICAM (MOBIC) 15 MG TABLET    Take 1 tablet (15 mg total) by mouth daily.        This chart was dictated using voice recognition software/Dragon. Despite best efforts to proofread, errors can  occur which can change the meaning. Any change was purely unintentional.    Darletta Moll, PA-C 02/09/17 2103    Delman Kitten, MD 02/10/17 [place]

## 2017-02-09 NOTE — ED Notes (Signed)
Pt discharged to home.  Family member driving.  Discharge instructions reviewed.  Verbalized understanding.  No questions or concerns at this time.  Teach back verified.  Pt in NAD.  No items left in ED.   

## 2017-02-09 NOTE — ED Triage Notes (Signed)
Patient presents to ED via POV from home. Patient states she had a fall 2 days. [place]. Patient c/o left hand and foot pain. Patient holding a book in her left hand.

## 2017-02-09 NOTE — ED Notes (Signed)
Pt states she fell a couple of days ago and hurt her L hand and L foot.  Pt states pain has gotten worse since she fell.  Pt states she took aspirin at home for pain and used epsom salt with no relief.  Pt states she also used ice.  Pt states she can walk and move hand fine.  Pt states she is just in pain.

## 2017-02-22 ENCOUNTER — Emergency Department
Admission: EM | Admit: 2017-02-22 | Discharge: 2017-02-23 | Disposition: A | Discharge status: Home / Self Care | Payer: Medicaid Other | Attending: Emergency Medicine | Admitting: Emergency Medicine

## 2017-02-22 ENCOUNTER — Emergency Department: Payer: Medicaid Other

## 2017-02-22 ENCOUNTER — Encounter: Payer: Self-pay | Admitting: Emergency Medicine

## 2017-02-22 DIAGNOSIS — Z7982 Long term (current) use of aspirin: Secondary | ICD-10-CM | POA: Insufficient documentation

## 2017-02-22 DIAGNOSIS — I509 Heart failure, unspecified: Secondary | ICD-10-CM | POA: Diagnosis not present

## 2017-02-22 DIAGNOSIS — I2510 Atherosclerotic heart disease of native coronary artery without angina pectoris: Secondary | ICD-10-CM | POA: Insufficient documentation

## 2017-02-22 DIAGNOSIS — Z8673 Personal history of transient ischemic attack (TIA), and cerebral infarction without residual deficits: Secondary | ICD-10-CM | POA: Insufficient documentation

## 2017-02-22 DIAGNOSIS — R079 Chest pain, unspecified: Secondary | ICD-10-CM

## 2017-02-22 DIAGNOSIS — E119 Type 2 diabetes mellitus without complications: Secondary | ICD-10-CM | POA: Diagnosis not present

## 2017-02-22 DIAGNOSIS — Z794 Long term (current) use of insulin: Secondary | ICD-10-CM | POA: Insufficient documentation

## 2017-02-22 DIAGNOSIS — Z79899 Other long term (current) drug therapy: Secondary | ICD-10-CM | POA: Diagnosis not present

## 2017-02-22 DIAGNOSIS — J449 Chronic obstructive pulmonary disease, unspecified: Secondary | ICD-10-CM | POA: Diagnosis not present

## 2017-02-22 DIAGNOSIS — I110 Hypertensive heart disease with heart failure: Secondary | ICD-10-CM | POA: Diagnosis not present

## 2017-02-22 DIAGNOSIS — E860 Dehydration: Secondary | ICD-10-CM

## 2017-02-22 DIAGNOSIS — Z791 Long term (current) use of non-steroidal anti-inflammatories (NSAID): Secondary | ICD-10-CM | POA: Insufficient documentation

## 2017-02-22 DIAGNOSIS — F17210 Nicotine dependence, cigarettes, uncomplicated: Secondary | ICD-10-CM | POA: Diagnosis not present

## 2017-02-22 DIAGNOSIS — I252 Old myocardial infarction: Secondary | ICD-10-CM | POA: Diagnosis not present

## 2017-02-22 DIAGNOSIS — Z7902 Long term (current) use of antithrombotics/antiplatelets: Secondary | ICD-10-CM | POA: Diagnosis not present

## 2017-02-22 LAB — CBC
HCT: 48.1 % — ABNORMAL HIGH (ref 35.0–47.0)
Hemoglobin: 16.6 g/dL — ABNORMAL HIGH (ref 12.0–16.0)
MCH: 34.2 pg — AB (ref 26.0–34.0)
MCHC: 34.4 g/dL (ref 32.0–36.0)
MCV: 99.3 fL (ref 80.0–100.0)
PLATELETS: 221 10*3/uL (ref 150–440)
RBC: 4.85 MIL/uL (ref 3.80–5.20)
RDW: 12.9 % (ref 11.5–14.5)
WBC: 14.2 10*3/uL — ABNORMAL HIGH (ref 3.6–11.0)

## 2017-02-22 LAB — BASIC METABOLIC PANEL
Anion gap: 9 (ref 5–15)
BUN: 9 mg/dL (ref 6–20)
CHLORIDE: 96 mmol/L — AB (ref 101–111)
CO2: 28 mmol/L (ref 22–32)
CREATININE: 0.7 mg/dL (ref 0.44–1.00)
Calcium: 9.5 mg/dL (ref 8.9–10.3)
GFR calc non Af Amer: >60 mL/min (ref >60)
GLUCOSE: 282 mg/dL — AB (ref 65–99)
Potassium: 3.7 mmol/L (ref 3.5–5.1)
Sodium: 133 mmol/L — ABNORMAL LOW (ref 135–145)

## 2017-02-22 LAB — TROPONIN I
Troponin I: <0.03 ng/mL (ref <0.03)
Troponin I: <0.03 ng/mL (ref <0.03)

## 2017-02-22 MED ORDER — ASPIRIN 81 MG PO CHEW: 324.0000 mg | CHEWABLE_TABLET | Freq: Once | ORAL | Status: DC

## 2017-02-22 MED ORDER — SODIUM CHLORIDE 0.9 % IV BOLUS (SEPSIS)
500.0000 mL | Freq: Once | INTRAVENOUS | Status: AC
  Administered 2017-02-22: 500 mL via INTRAVENOUS

## 2017-02-22 NOTE — ED Notes (Signed)
MD at bedside. 

## 2017-02-22 NOTE — ED Provider Notes (Signed)
[facility] Emergency Department Provider Note   ____________________________________________   First MD Initiated Contact with Patient 02/22/17 2315     (approximate)  I have reviewed the triage vital signs and the nursing notes.   HISTORY  Chief Complaint Chest Pain    HPI Barbara Bowen is a 61 y.o. female who presents to the ED from home with a chief complaint of chest pain.  Patient has a history of CAD who states she was yelling at her cat this evening when she developed central chest tightness.  Symptoms associated with nausea and shortness of breath.  Without intervention symptoms have completely resolved after 1 hour.  Denies associated diaphoresis, vomiting, dizziness, palpitations, abdominal pain, dysuria, diarrhea.  Denies recent travel, trauma or hormone use.  Nothing makes her symptoms better or worse.   Past Medical History:  Diagnosis Date  . Anxiety   . CHF (congestive heart failure) ([place])   . COPD (chronic obstructive pulmonary disease) ([place])   . Coronary artery disease    a. 09/2007 s/p PCI/DES to prox RCA;  b. 2012 Cath: nonobs dzs;  c. 01/2014 PCI: 95d (3.0x23 Xience [place]), RPDA 80 (PTCA), EF 60%;  d. 11/2014 Cath: LM anomalous, LAD/D1/D2 min irregs, LCX small, min irregs, OM2 small, RCA 20p ISR, 10d ISR, EF 65%.  . DM type 2 (diabetes mellitus, type 2) ([place])   . Enthesopathy of hip region   . Essential hypertension   . Headache   . Hx-TIA (transient ischemic attack)   . Hyperlipidemia   . Insomnia, unspecified   . Leukocytosis, unspecified   . Lipoma of other skin and subcutaneous tissue   . OA (osteoarthritis)   . Occlusion and stenosis of carotid artery without mention of cerebral infarction   . Palpitations   . Paroxysmal atrial fibrillation (HCC)    a. not appreciated on holter 09/2013;  b. CHA2DS2VASc = 5 (htn, DM, h/o TIA, vasc dzs, female) - not on [place].  Marland Kitchen Plantar fascial fibromatosis   . Proteinuria   . Stroke ([place])    . Tobacco use disorder    a. does not believe cigarettes contribute to her dyspnea/asthma.    Marland Kitchen Unspecified sleep apnea    a. Wears O2 via  @ HS.  Marland Kitchen Unspecified tinnitus     Patient Active Problem List   Diagnosis Date Noted  . Dyslipidemia associated with type 2 diabetes mellitus ([place]) 06/28/2016  . Spinal stenosis 10/15/2015  . Bilateral tinnitus 08/23/2015  . B12 deficiency 03/31/2015  . OSA (obstructive sleep apnea) 03/31/2015  . 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 Intertrigo 02/23/2015  . Coronary artery disease   . Insomnia 12/29/2014  . Migraine without aura and without status migrainosus, not intractable 12/29/2014  . Leukocytosis 12/24/2014  . Macrocytic 12/24/2014  . Unstable angina ([place])   . Essential hypertension   . Paroxysmal atrial fibrillation (HCC)   . Diabetes mellitus with renal manifestations, uncontrolled ([place])   . History of MI (myocardial infarction) 10/30/2013  . Benign neoplasm of breast 09/12/2012  . Nocturnal hypoxemia due to obesity 09/09/2012  . COPD (chronic obstructive pulmonary disease) ([place]) 09/09/2012  . Smoking 03/27/2011  . Hyperlipidemia 10/28/2009  . CAD (coronary artery disease), native coronary artery 10/28/2009  . Shortness of breath 10/28/2009  . Angina pectoris ([place]) 10/28/2009    Past Surgical History:  Procedure Laterality Date  . ABDOMINAL HYSTERECTOMY    . APPENDECTOMY    . BREAST SURGERY Right  06-09-2012   biopsy x2 9 oclock and 11 oclock  . CARDIAC CATHETERIZATION  2009   s/p right coronary artery drug-eluting stent  . CARDIAC CATHETERIZATION N/A 12/16/2014   Procedure: Left Heart Cath and Coronary Angiography;  Surgeon: Minna Merritts, MD;  Location: [facility];  Service: Cardiovascular;  Laterality: N/A;  . CORONARY ANGIOPLASTY  2015  . exploratory thoracotomy    . MOLE REMOVAL      Prior to Admission medications   Medication Sig Start Date End Date Taking?  Authorizing Provider  albuterol (PROAIR HFA) 108 (90 Base) MCG/ACT inhaler Inhale 2 puffs into the lungs every 6 (six) hours as needed. 10/05/16  Yes Steele Sizer, MD  aspirin 81 MG EC tablet Take 81 mg by mouth daily.    Yes [provider]  clopidogrel (PLAVIX) 75 MG tablet TAKE 1 TABLET (75 MG TOTAL) BY MOUTH DAILY. 08/24/16  Yes Minna Merritts, MD  ezetimibe (ZETIA) 10 MG tablet Take 1 tablet (10 mg total) by mouth daily. 08/24/16  Yes Gollan, Kathlene November, MD  HYDROcodone-acetaminophen (NORCO) 10-325 MG tablet Take 1 tablet by mouth every 6 (six) hours as needed. Take 1 tablet daily as needed for headaches 01/11/17  Yes Sowles, Drue Stager, MD  Insulin Degludec-Liraglutide (XULTOPHY) 100-3.6 UNIT-MG/ML SOPN Inject 16-50 Units into the skin daily. 01/11/17  Yes Sowles, Drue Stager, MD  meloxicam (MOBIC) 15 MG tablet Take 1 tablet (15 mg total) by mouth daily. 02/09/17  Yes Cuthriell, Charline Bills, PA-C  nebivolol (BYSTOLIC) 10 MG tablet Take 1 tablet (10 mg total) by mouth daily. 08/24/16  Yes Gollan, Kathlene November, MD  nitroGLYCERIN (NITROSTAT) 0.4 MG SL tablet Place 0.4 mg under the tongue every 5 (five) minutes as needed for chest pain.   Yes [provider]  rosuvastatin (CRESTOR) 40 MG tablet Take 1 tablet (40 mg total) by mouth daily. 08/24/16  Yes Minna Merritts, MD  BD ULTRA-FINE LANCETS lancets Use as instructed 01/14/17   Steele Sizer, MD  glucose blood (ONE TOUCH ULTRA TEST) test strip Use as instructed 06/28/16   Steele Sizer, MD  Insulin Pen Needle (NOVOFINE) 30G X 8 MM MISC Inject 10 each into the skin as needed. 01/11/17   Steele Sizer, MD  ketoconazole (NIZORAL) 2 % cream Apply 1 application topically 2 (two) times daily. Mix with hydrocortison 10 otc ( patient will buy it otc ) 06/24/15   Sowles, Drue Stager, MD  nitroGLYCERIN (NITRO-DUR) 0.2 mg/hr patch Place 1 patch (0.2 mg total) onto the skin daily. Patient not taking: Reported on 02/22/2017 01/04/15   Minna Merritts, MD     Allergies Amoxicillin-pot clavulanate; Cefprozil; Eggs or egg-derived products; Esomeprazole magnesium; Montelukast sodium; Omeprazole; Penicillins; Prednisone; Spiriva [tiotropium bromide monohydrate]; and Advair diskus [fluticasone-salmeterol]  Family History  Problem Relation Age of Onset  . Heart disease Father        CAD  . Breast cancer Maternal Aunt   . Coronary artery disease Mother   . Diabetes type II Mother     Social History Social History  Substance Use Topics  . Smoking status: Current Every Day Smoker    Packs/day: 2.00    Years: 37.00    Types: Cigarettes    Start date: 02/01/1978  . Smokeless tobacco: Never Used  . Alcohol use No    Review of Systems  Constitutional: No fever/chills. Eyes: No visual changes. ENT: No sore throat. Cardiovascular: Positive for chest pain. Respiratory: Positive for shortness of breath. Gastrointestinal: No abdominal pain.  Positive for nausea, no vomiting.  No diarrhea.  No constipation. Genitourinary: Negative for dysuria. Musculoskeletal: Negative for back pain. Skin: Negative for rash. Neurological: Negative for headaches, focal weakness or numbness.   ____________________________________________   PHYSICAL EXAM:  VITAL SIGNS: ED Triage Vitals  Enc Vitals Group     BP 02/22/17 1926 (!) 150/64     Pulse Rate 02/22/17 1926 81     Resp 02/22/17 1926 20     Temp 02/22/17 1926 98.7 F (37.1 C)     Temp Source 02/22/17 1926 Oral     SpO2 02/22/17 1926 94 %     Weight 02/22/17 1926 200 lb (90.7 kg)     Height 02/22/17 1926 5' 0.5" (1.537 m)     Head Circumference --      Peak Flow --      Pain Score 02/22/17 1925 4     Pain Loc --      Pain Edu? --      Excl. in [place]? --     Constitutional: Alert and oriented. Well appearing and in no acute distress. Eyes: Conjunctivae are normal. PERRL. EOMI. Head: Atraumatic. Nose: No congestion/rhinnorhea. Mouth/Throat: Mucous membranes are moist.  Oropharynx  non-erythematous. Neck: No stridor.  No carotid bruits. Cardiovascular: Normal rate, regular rhythm. Grossly normal heart sounds.  Good peripheral circulation. Respiratory: Normal respiratory effort.  No retractions. Lungs with scattered wheezing which clears with respiration. Gastrointestinal: Soft and nontender. No distention. No abdominal bruits. No CVA tenderness. Musculoskeletal: No lower extremity tenderness nor edema.  No joint effusions. Neurologic:  Normal speech and language. No gross focal neurologic deficits are appreciated. No gait instability. Skin:  Skin is warm, dry and intact. No rash noted. Psychiatric: Mood and affect are normal. Speech and behavior are normal.  ____________________________________________   LABS (all labs ordered are listed, but only abnormal results are displayed)  Labs Reviewed  BASIC METABOLIC PANEL - Abnormal; Notable for the following:       Result Value   Sodium 133 (*)    Chloride 96 (*)    Glucose, Bld 282 (*)    All other components within normal limits  CBC - Abnormal; Notable for the following:    WBC 14.2 (*)    Hemoglobin 16.6 (*)    HCT 48.1 (*)    MCH 34.2 (*)    All other components within normal limits  TROPONIN I  TROPONIN I   ____________________________________________  EKG  ED ECG REPORT I, SUNG,JADE J, the attending physician, personally viewed and interpreted this ECG.   Date: 02/22/2017  EKG Time: 1918  Rate: 79  Rhythm: normal EKG, normal sinus rhythm  Axis: LAD  Intervals:left anterior fascicular block  ST&T Change: T wave inversion in inferior leads No change since 08/27/2016  ____________________________________________  RADIOLOGY  Dg Chest 2 View  Result Date: 02/22/2017 CLINICAL DATA:  61 y/o  F; chest pain. EXAM: CHEST  2 VIEW COMPARISON:  05/18/2016 chest radiograph FINDINGS: Streaky opacities in the left-greater-than-right lung bases. Normal cardiac silhouette. Calcific atherosclerosis of the  aorta. No pleural effusion. Bones are unremarkable. IMPRESSION: Streaky opacities of lung bases may represent bronchitic changes and/or atelectasis. Electronically Signed   By: Kristine Garbe M.D.   On: 02/22/2017 19:56    ____________________________________________   PROCEDURES  Procedure(s) performed: None  Procedures  Critical Care performed: No  ____________________________________________   INITIAL IMPRESSION / ASSESSMENT AND PLAN / ED COURSE  As part of my medical decision making, I reviewed the  following data within the [place] History obtained from family, Nursing notes reviewed and incorporated, Labs reviewed, EKG interpreted, Radiograph reviewed and Notes from prior ED visits.   61 year old female with a history of CAD who presents with chest pain onset while yelling at her cat. Differential diagnosis includes, but is not limited to, ACS, aortic dissection, pulmonary embolism, cardiac tamponade, pneumothorax, pneumonia, pericarditis/myocarditis, GI-related causes including esophagitis/gastritis, and musculoskeletal chest wall pain.  Currently chest pain-free and eager for discharge.  Agreeable to stay for repeat troponin, IV hydration and sandwich tray.  ----------------------------------------- 11:28 PM on 02/22/2017 -----------------------------------------   OBSERVATION CARE: This patient is being placed under observation care for the following reasons: Chest pain with repeat testing to rule out ischemia    Clinical Course as of Feb 23 2  Sat Feb 23, 2017  0002 Patient resting in no acute distress.  Voices no complaints.  [JS]    Clinical Course User Index [JS] Paulette Blanch, MD    ----------------------------------------- 12:03 AM on 02/23/2017 -----------------------------------------   END OF OBSERVATION STATUS: After an appropriate period of observation, this patient is being discharged due to the following reason(s):  Repeat negative troponin, no chest pain.  Strict return precautions given.  Patient and family verbalize understanding and agrees with plan of care. ____________________________________________   FINAL CLINICAL IMPRESSION(S) / ED DIAGNOSES  Final diagnoses:  Chest pain, unspecified type  Dehydration      NEW MEDICATIONS STARTED DURING THIS VISIT:  New Prescriptions   No medications on file     Note:  This document was prepared using Dragon voice recognition software and may include unintentional dictation errors.    Paulette Blanch, MD 02/23/17 [phone]

## 2017-02-22 NOTE — ED Triage Notes (Signed)
Pt presents to ED with c/o squeezing chest pain with sob for the past hour. +nausea. Cardiac hx. No increased work of breathing or acute distress noted at this time. Pt states she was yelling at her kitten at the onset of her pain. States she hasn't felt right all day.

## 2017-02-23 NOTE — ED Notes (Signed)
Pt reports she had taken 324 mg ASA at home prior to coming into ED.

## 2017-02-23 NOTE — Discharge Instructions (Signed)
1.  Continue medications as directed by your doctor. 2.  Drink plenty of fluids daily. 3.  Return to the ER for worsening symptoms, persistent vomiting, difficulty breathing or other concerns.

## 2017-03-04 ENCOUNTER — Telehealth: Payer: Self-pay

## 2017-03-04 NOTE — Telephone Encounter (Signed)
[facility] was calling to check to see when patient's last office visit was and to also check to see if last note reflected documentation of her oxygen usage. It is time for patient to be re-evaluated for her oxygen and she needs good documentation to support her oxygen therapy. She will call patient to set up face to face appointment to discuss oxygen therapy and schedule an overnight oxygen titration.

## 2017-03-06 ENCOUNTER — Encounter: Payer: Self-pay | Admitting: Family Medicine

## 2017-03-06 ENCOUNTER — Ambulatory Visit: Payer: Medicaid Other | Admitting: Family Medicine

## 2017-03-06 VITALS — BP 130/70 | HR 84 | Resp 14 | Ht 60.5 in | Wt 195.9 lb

## 2017-03-06 DIAGNOSIS — F17200 Nicotine dependence, unspecified, uncomplicated: Secondary | ICD-10-CM

## 2017-03-06 DIAGNOSIS — G4736 Sleep related hypoventilation in conditions classified elsewhere: Secondary | ICD-10-CM | POA: Diagnosis not present

## 2017-03-06 DIAGNOSIS — J449 Chronic obstructive pulmonary disease, unspecified: Secondary | ICD-10-CM

## 2017-03-06 NOTE — Progress Notes (Signed)
Name: Barbara Bowen   MRN: [id number]    DOB: 08/03/1955   Date:03/06/2017       Progress Note  Subjective  Chief Complaint  Chief Complaint  Patient presents with  . Nocturnal hypoxemia    [facility] set up face to face. Needs good documentation to support her being on oxygen.    HPI  Nocturnal hypoxemia/COPD/Asthma: she is still smoking, on average smokes 2-2.5 packs daily, she has daily  wet cough and SOB with activity, she has OSA and only using oxygen for hypoxemia because she could not get the oxygen and CPAP to work together. She states when she sleeps without oxygen she wakes up more tired, dry mouth and sometimes wakes herself up from sleep snoring. She needs to get orders for oxygen, and we will try again to get CPAP working since symptoms are likely secondary to OSA  Patient Active Problem List   Diagnosis Date Noted  . Dyslipidemia associated with type 2 diabetes mellitus ([place]) 06/28/2016  . Spinal stenosis 10/15/2015  . Bilateral tinnitus 08/23/2015  . B12 deficiency 03/31/2015  . OSA (obstructive sleep apnea) 03/31/2015  . 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 Intertrigo 02/23/2015  . Coronary artery disease   . Insomnia 12/29/2014  . Migraine without aura and without status migrainosus, not intractable 12/29/2014  . Leukocytosis 12/24/2014  . Macrocytic 12/24/2014  . Unstable angina ([place])   . Essential hypertension   . Paroxysmal atrial fibrillation (HCC)   . Diabetes mellitus with renal manifestations, uncontrolled ([place])   . History of MI (myocardial infarction) 10/30/2013  . Benign neoplasm of breast 09/12/2012  . Nocturnal hypoxemia due to obesity 09/09/2012  . COPD (chronic obstructive pulmonary disease) ([place]) 09/09/2012  . Smoking 03/27/2011  . Hyperlipidemia 10/28/2009  . CAD (coronary artery disease), native coronary artery 10/28/2009  . Shortness of breath 10/28/2009  . Angina pectoris ([place]) 10/28/2009    Past  Surgical History:  Procedure Laterality Date  . ABDOMINAL HYSTERECTOMY    . APPENDECTOMY    . BREAST SURGERY Right 06-09-2012   biopsy x2 9 oclock and 11 oclock  . CARDIAC CATHETERIZATION  2009   s/p right coronary artery drug-eluting stent  . CORONARY ANGIOPLASTY  2015  . exploratory thoracotomy    . MOLE REMOVAL      Family History  Problem Relation Age of Onset  . Heart disease Father        CAD  . Breast cancer Maternal Aunt   . Coronary artery disease Mother   . Diabetes type II Mother     Social History   Socioeconomic History  . Marital status: Married    Spouse name: Not on file  . Number of children: Not on file  . Years of education: Not on file  . Highest education level: Not on file  Social Needs  . Financial resource strain: Not on file  . Food insecurity - worry: Not on file  . Food insecurity - inability: Not on file  . Transportation needs - medical: Not on file  . Transportation needs - non-medical: Not on file  Occupational History  . Occupation: unemployed    Employer: UNEMPLO  Tobacco Use  . Smoking status: Current Every Day Smoker    Packs/day: 2.00    Years: 37.00    Pack years: 74.00    Types: Cigarettes    Start date: 02/01/1978  . Smokeless tobacco: Never Used  Substance and Sexual Activity  . Alcohol  use: No    Alcohol/week: 0.0 oz  . Drug use: No  . Sexual activity: Not Currently  Other Topics Concern  . Not on file  Social History Narrative   Lives in [place] with husband.  Does not work.  Does not routinely exercise.     Current Outpatient Medications:  .  albuterol (PROAIR HFA) 108 (90 Base) MCG/ACT inhaler, Inhale 2 puffs into the lungs every 6 (six) hours as needed., Disp: 1 Inhaler, Rfl: 0 .  aspirin 81 MG EC tablet, Take 81 mg by mouth daily. , Disp: , Rfl:  .  BD ULTRA-FINE LANCETS lancets, Use as instructed, Disp: 100 each, Rfl: 12 .  clopidogrel (PLAVIX) 75 MG tablet, TAKE 1 TABLET (75 MG TOTAL) BY MOUTH DAILY., Disp:  90 tablet, Rfl: 3 .  ezetimibe (ZETIA) 10 MG tablet, Take 1 tablet (10 mg total) by mouth daily., Disp: 90 tablet, Rfl: 3 .  glucose blood (ONE TOUCH ULTRA TEST) test strip, Use as instructed, Disp: 100 each, Rfl: 12 .  HYDROcodone-acetaminophen (NORCO) 10-325 MG tablet, Take 1 tablet by mouth every 6 (six) hours as needed. Take 1 tablet daily as needed for headaches, Disp: 30 tablet, Rfl: 0 .  Insulin Degludec-Liraglutide (XULTOPHY) 100-3.6 UNIT-MG/ML SOPN, Inject 16-50 Units into the skin daily., Disp: 9 mL, Rfl: 0 .  Insulin Pen Needle (NOVOFINE) 30G X 8 MM MISC, Inject 10 each into the skin as needed., Disp: 100 each, Rfl: 0 .  ketoconazole (NIZORAL) 2 % cream, Apply 1 application topically 2 (two) times daily. Mix with hydrocortison 10 otc ( patient will buy it otc ), Disp: 60 g, Rfl: 2 .  meloxicam (MOBIC) 15 MG tablet, Take 1 tablet (15 mg total) by mouth daily., Disp: 30 tablet, Rfl: 0 .  nebivolol (BYSTOLIC) 10 MG tablet, Take 1 tablet (10 mg total) by mouth daily., Disp: 90 tablet, Rfl: 3 .  nitroGLYCERIN (NITRO-DUR) 0.2 mg/hr patch, Place 1 patch (0.2 mg total) onto the skin daily. (Patient not taking: Reported on 02/22/2017), Disp: 30 patch, Rfl: 6 .  nitroGLYCERIN (NITROSTAT) 0.4 MG SL tablet, Place 0.4 mg under the tongue every 5 (five) minutes as needed for chest pain., Disp: , Rfl:  .  rosuvastatin (CRESTOR) 40 MG tablet, Take 1 tablet (40 mg total) by mouth daily., Disp: 90 tablet, Rfl: 3  Allergies  Allergen Reactions  . Amoxicillin-Pot Clavulanate     Vomiting   . Cefprozil     Vomiting   . Eggs Or Egg-Derived Products     Hives   . Esomeprazole Magnesium     unknown  . Montelukast Sodium     unknown  . Omeprazole     unknown  . Penicillins     Vomiting blood  . Prednisone     Rapid heart beat & difficulty breathing  . Spiriva [Tiotropium Bromide Monohydrate]     HA  . Advair Diskus [Fluticasone-Salmeterol] Palpitations    Also happened with Symbicort      ROS  Ten systems reviewed and is negative except as mentioned in HPI   Objective  Vitals:   03/06/17 1129  BP: 130/70  Pulse: 84  Resp: 14  SpO2: 94%  Weight: 195 lb 14.4 oz (88.9 kg)  Height: 5' 0.5" (1.537 m)    Body mass index is 37.63 kg/m.  Physical Exam  Constitutional: Patient appears well-developed and well-nourished. Obese No distress.  HEENT: head atraumatic, normocephalic, pupils equal and reactive to light,  neck supple, throat within  normal limits Cardiovascular: Normal rate, regular rhythm and normal heart sounds.  No murmur heard. No BLE edema. Pulmonary/Chest: Effort normal and breath sounds normal. No respiratory distress.  Abdominal: Soft.  There is no tenderness. Psychiatric: Patient has a normal mood and affect. behavior is normal. Judgment and thought content normal.  Recent Results (from the past 2160 hour(s))  COMPLETE METABOLIC PANEL WITH GFR     Status: Abnormal   Collection Time: 01/11/17 12:00 AM  Result Value Ref Range   Glucose, Bld 360 (H) 65 - 99 mg/dL    Comment: .            Fasting reference interval . For someone without known diabetes, a glucose value >125 mg/dL indicates that they may have diabetes and this should be confirmed with a follow-up test. .    BUN 8 7 - 25 mg/dL   Creat 0.75 0.50 - 1.05 mg/dL    Comment: For patients >29 years of age, the reference limit for Creatinine is approximately 13% higher for people identified as African-American. .    GFR, Est Non African American 88 > OR = 60 mL/min/1.41m   GFR, Est African American 102 > OR = 60 mL/min/1.741m  BUN/Creatinine Ratio NOT APPLICABLE 6 - 22 (calc)   Sodium 132 (L) 135 - 146 mmol/L   Potassium 4.1 3.5 - 5.3 mmol/L   Chloride 95 (L) 98 - 110 mmol/L   CO2 28 20 - 32 mmol/L   Calcium 9.5 8.6 - 10.4 mg/dL   Total Protein 7.1 6.1 - 8.1 g/dL   Albumin 3.7 3.6 - 5.1 g/dL   Globulin 3.4 1.9 - 3.7 g/dL (calc)   AG Ratio 1.1 1.0 - 2.5 (calc)   Total  Bilirubin 0.4 0.2 - 1.2 mg/dL   Alkaline phosphatase (APISO) 106 33 - 130 U/L   AST 19 10 - 35 U/L   ALT 23 6 - 29 U/L  CBC with Differential/Platelet     Status: Abnormal   Collection Time: 01/11/17 12:00 AM  Result Value Ref Range   WBC 12.7 (H) 3.8 - 10.8 Thousand/uL   RBC 4.85 3.80 - 5.10 Million/uL   Hemoglobin 16.2 (H) 11.7 - 15.5 g/dL   HCT 47.7 (H) 35.0 - 45.0 %   MCV 98.4 80.0 - 100.0 fL   MCH 33.4 (H) 27.0 - 33.0 pg   MCHC 34.0 32.0 - 36.0 g/dL   RDW 11.6 11.0 - 15.0 %   Platelets 243 140 - 400 Thousand/uL   MPV 10.8 7.5 - 12.5 fL   Neutro Abs 7,036 1,500 - 7,800 cells/uL   Lymphs Abs 4,534 (H) 850 - 3,900 cells/uL   WBC mixed population 800 200 - 950 cells/uL   Eosinophils Absolute 229 15 - 500 cells/uL   Basophils Absolute 102 0 - 200 cells/uL   Neutrophils Relative % 55.4 %   Total Lymphocyte 35.7 %   Monocytes Relative 6.3 %   Eosinophils Relative 1.8 %   Basophils Relative 0.8 %  Lipid panel     Status: Abnormal   Collection Time: 01/11/17 12:00 AM  Result Value Ref Range   Cholesterol 182 <200 mg/dL   HDL 35 (L) >50 mg/dL   Triglycerides 204 (H) <150 mg/dL   LDL Cholesterol (Calc) 115 (H) mg/dL (calc)    Comment: Reference range: <100 . Desirable range <100 mg/dL for primary prevention;   <70 mg/dL for patients with CHD or diabetic patients  with > or = 2 CHD risk factors. .Marland Kitchen  LDL-C is now calculated using the Martin-Hopkins  calculation, which is a validated novel method providing  better accuracy than the Friedewald equation in the  estimation of LDL-C.  Cresenciano Genre et al. Annamaria Helling. 0998;338(25): 2061-2068  (http://education.Quest[facility].com/faq/FAQ164)    Total CHOL/HDL Ratio 5.2 (H) <5.0 (calc)   Non-HDL Cholesterol (Calc) 147 (H) <130 mg/dL (calc)    Comment: For patients with diabetes plus 1 major ASCVD risk  factor, treating to a non-HDL-C goal of <100 mg/dL  (LDL-C of <70 mg/dL) is considered a therapeutic  option.   Vitamin B12     Status:  None   Collection Time: 01/11/17 12:00 AM  Result Value Ref Range   Vitamin B-12 476 200 - 1,100 pg/mL  VITAMIN D 25 Hydroxy (Vit-D Deficiency, Fractures)     Status: Abnormal   Collection Time: 01/11/17 12:00 AM  Result Value Ref Range   Vit D, 25-Hydroxy 10 (L) 30 - 100 ng/mL    Comment: Vitamin D Status         25-OH Vitamin D: . Deficiency:                    <20 ng/mL Insufficiency:             20 - 29 ng/mL Optimal:                 > or = 30 ng/mL . For 25-OH Vitamin D testing on patients on  D2-supplementation and patients for whom quantitation  of D2 and D3 fractions is required, the QuestAssureD(TM) 25-OH VIT D, (D2,D3), LC/MS/MS is recommended: order  code [id number] (patients >12yr). . For more information on this test, go to: http://education.questdiagnostics.com/faq/FAQ163 (This link is being provided for  informational/educational purposes only.)   Microalbumin / creatinine urine ratio     Status: None   Collection Time: 01/11/17 12:00 AM  Result Value Ref Range   Creatinine, Urine 50 20 - 275 mg/dL   Microalb, Ur 0.9 mg/dL    Comment: Reference Range Not established    Microalb Creat Ratio 18 <30 mcg/mg creat    Comment: . The ADA defines abnormalities in albumin excretion as follows: .Marland KitchenCategory         Result (mcg/mg creatinine) . Normal                    <30 Microalbuminuria         30-299  Clinical albuminuria   > OR = 300 . The ADA recommends that at least two of three specimens collected within a 3-6 month period be abnormal before considering a patient to be within a diagnostic category.   POCT HgB A1C     Status: Abnormal   Collection Time: 01/11/17 11:42 AM  Result Value Ref Range   Hemoglobin A1C 167.3  Basic metabolic panel     Status: Abnormal   Collection Time: 02/22/17  7:30 PM  Result Value Ref Range   Sodium 133 (L) 135 - 145 mmol/L   Potassium 3.7 3.5 - 5.1 mmol/L   Chloride 96 (L) 101 - 111 mmol/L   CO2 28 22 - 32 mmol/L    Glucose, Bld 282 (H) 65 - 99 mg/dL   BUN 9 6 - 20 mg/dL   Creatinine, Ser 0.70 0.44 - 1.00 mg/dL   Calcium 9.5 8.9 - 10.3 mg/dL   GFR calc non Af Amer >60 >60 mL/min   GFR calc Af Amer >60 >60 mL/min  Comment: (NOTE) The eGFR has been calculated using the CKD EPI equation. This calculation has not been validated in all clinical situations. eGFR's persistently <60 mL/min signify possible Chronic Kidney Disease.    Anion gap 9 5 - 15  CBC     Status: Abnormal   Collection Time: 02/22/17  7:30 PM  Result Value Ref Range   WBC 14.2 (H) 3.6 - 11.0 K/uL   RBC 4.85 3.80 - 5.20 MIL/uL   Hemoglobin 16.6 (H) 12.0 - 16.0 g/dL   HCT 48.1 (H) 35.0 - 47.0 %   MCV 99.3 80.0 - 100.0 fL   MCH 34.2 (H) 26.0 - 34.0 pg   MCHC 34.4 32.0 - 36.0 g/dL   RDW 12.9 11.5 - 14.5 %   Platelets 221 150 - 440 K/uL  Troponin I     Status: None   Collection Time: 02/22/17  7:30 PM  Result Value Ref Range   Troponin I <0.03 <0.03 ng/mL  Troponin I     Status: None   Collection Time: 02/22/17 11:26 PM  Result Value Ref Range   Troponin I <0.03 <0.03 ng/mL     PHQ2/9: Depression screen [facility] 2/9 06/28/2016 03/29/2016 11/03/2015 09/30/2015 08/23/2015  Decreased Interest 0 0 0 0 0  Down, Depressed, Hopeless 0 0 0 0 0  PHQ - 2 Score 0 0 0 0 0     Fall Risk: Fall Risk  03/06/2017 06/28/2016 03/29/2016 11/03/2015 09/30/2015  Falls in the past year? No Yes Yes Yes Yes  Comment - - - accidentally patient off her step accidentally patient off her step  Number falls in past yr: - 2 or more '1 1 1  '$ Injury with Fall? - Yes No No No  Follow up - - Falls evaluation completed - -     Functional Status Survey: Is the patient deaf or have difficulty hearing?: No Does the patient have difficulty seeing, even when wearing glasses/contacts?: No Does the patient have difficulty concentrating, remembering, or making decisions?: No Does the patient have difficulty walking or climbing stairs?: No Does the patient have difficulty  dressing or bathing?: No Does the patient have difficulty doing errands alone such as visiting a doctor's office or shopping?: No   Assessment & Plan  1. Smoking  She is still smoking and explained importance of quitting  2. COPD with asthma [facility])  She will see pulmonologist in January  3. 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

## 2017-03-13 ENCOUNTER — Telehealth: Payer: Self-pay

## 2017-03-13 NOTE — Telephone Encounter (Signed)
Copied from [place]. Topic: Inquiry >> Mar 13, 2017  1:30 PM Corie Chiquito, [place] wrote: Reason for CRM:  [place] from [facility] called and would like to know if you all could fax over her chart notes for Feb 22, 2017. Patient need authorization to keep her oxygen.  Fax number [phone] or call her at [phone] ext.[place]

## 2017-03-15 NOTE — Telephone Encounter (Signed)
I tried to contact [place] with [facility] to see how I could be of assistance to her but when I dialed the number it said something totally different. I tried a few more times just to make sure that I had not dialed the wrong number and it said the same thing, so I was not able to get in contact with the lady that left the message.  I then looked online to see if I could find out who I think was calling which was [facility], but their office was closed for the holiday.   [place].  [place], [place] [address]  [place] Serviced: [place], [place], [place], [place], [place], [place], [place]  Services Provided: CONCENTRATOR BILEVEL EQUIPMENT AND SUPPLIES LIQUID SYSTEM (PORTABLE) GAS SYSTEM (PORTABLE) LIQUID SYSTEM (STATIONARY) GAS SYSTEM (STATIONARY) COMPRESSOR CPAP EQUIPMENT AND SUPPLIES NEBULIZER THERAPY

## 2017-03-18 ENCOUNTER — Ambulatory Visit: Payer: Medicaid Other | Admitting: Podiatry

## 2017-03-26 ENCOUNTER — Telehealth: Payer: Self-pay

## 2017-03-26 DIAGNOSIS — J449 Chronic obstructive pulmonary disease, unspecified: Secondary | ICD-10-CM

## 2017-03-26 DIAGNOSIS — G4736 Sleep related hypoventilation in conditions classified elsewhere: Secondary | ICD-10-CM

## 2017-03-26 DIAGNOSIS — G4733 Obstructive sleep apnea (adult) (pediatric): Secondary | ICD-10-CM

## 2017-03-26 NOTE — Telephone Encounter (Signed)
Copied from [place]. Topic: Inquiry >> Mar 13, 2017  1:30 PM Corie Chiquito, [place] wrote: Reason for CRM:  Barbara Bowen from [facility] called and would like to know if you all could fax over her chart notes for Feb 22, 2017. Patient need authorization to keep her oxygen.  Fax number [phone] or call her at [phone] ext.[phone] >> Mar 15, 2017  8:52 AM [place], [place] A, [place] wrote: I tried to contact Barbara Bowen with [facility] to see how I could be of assistance to her but when I dialed the number it said something totally different. I tried a few more times just to make sure that I had not dialed the wrong number and it said the same thing, so I was not able to get in contact with the lady that left the message.  I then looked online to see if I could find out who I think was calling which was [facility], but their office was closed for the holiday.   [place].  [place], [place] [address]  [phone] Fax:[phone]  Counties Serviced: [place], [place], [place], [place], [place],  [place], [place]  Services Provided: CONCENTRATOR BILEVEL EQUIPMENT AND SUPPLIES LIQUID SYSTEM (PORTABLE) GAS SYSTEM (PORTABLE) LIQUID SYSTEM (STATIONARY) GAS SYSTEM (STATIONARY) COMPRESSOR CPAP EQUIPMENT AND SUPPLIES NEBULIZER THERAPY   >> Mar 26, 2017  1:14 PM Burnis Medin, [place] wrote: Barbara Bowen from [facility] called back about getting chart notes from visits. She would like a call back or fax them over to [phone]    Records from ED visit on 02/22/17 as well as OV notes from 03/06/2017 faxed to number above.

## 2017-03-26 NOTE — Telephone Encounter (Signed)
I called this patient to see what referral it is that she is referring to and she stated that it was for a sleep study. There has not been any new referrals placed for a sleep study and when I informed her of this, she stated that this was supposed to be taking care before March 23, 2017.  I told her that I will put a message in for Dr. Ancil Boozer and see what she says and she said, "there is no see, I'm getting tired of this "B.S" and you she needs to do this today.   She then said I'm sorry not to get mad at you but this bet not cause me to lose my oxygen again, b/c I just got it back.

## 2017-03-26 NOTE — Telephone Encounter (Signed)
Copied from [place]. Topic: Referral - Status >> Mar 26, 2017  1:18 PM Hewitt Shorts wrote: Reason for CRM: pt is checking on status of referral she states that this should have been handled by the 1st  Best number [phone]

## 2017-03-27 ENCOUNTER — Other Ambulatory Visit: Payer: Self-pay | Admitting: Family Medicine

## 2017-03-27 DIAGNOSIS — G4733 Obstructive sleep apnea (adult) (pediatric): Secondary | ICD-10-CM

## 2017-03-27 DIAGNOSIS — Z9981 Dependence on supplemental oxygen: Secondary | ICD-10-CM

## 2017-03-27 NOTE — Telephone Encounter (Signed)
I entered an urgent request, she left the last sleep study, she is having problems using CPAP without oxygen

## 2017-03-29 ENCOUNTER — Telehealth: Payer: Self-pay

## 2017-03-29 ENCOUNTER — Telehealth: Payer: Self-pay | Admitting: Family Medicine

## 2017-03-29 NOTE — Telephone Encounter (Signed)
Copied from [place]. Topic: General - Other >> Mar 29, 2017  4:14 PM Yvette Rack wrote: Reason for CRM: [place] from [facility] ext [phone] calling to let us know that she got a fax on a patient that's no theres and she will fax it back on a Barbara Bowen/[place] but she is still waiting on the fax she sent out on [place] for a sleep study please fax paperwork as soon as possible   I am not sure of any paperwork for [place] but I know she has been scheduled with [facility] for 04/11/17.

## 2017-03-29 NOTE — Telephone Encounter (Signed)
Copied from [place]. Topic: Referral - Status >> Mar 26, 2017  1:18 PM Hewitt Shorts wrote: Reason for CRM: pt is checking on status of referral she states that this should have been handled by the 1st  Best number [phone] >> Mar 28, 2017  2:09 PM Yvette Rack wrote: Pt states that she still haven't heard anything about sleep study referral she states that she has been waiting to hear from someone since first of Nov

## 2017-03-29 NOTE — Telephone Encounter (Signed)
Copied from [place] [phone]. Topic: Quick Communication - See Telephone Encounter >> Mar 29, 2017 12:22 PM Robina Ade, Helene Kelp D wrote: CRM for notification. See Telephone encounter for: 03/29/17. Anderson Malta with [employer] called and said that a VO2 order was sent to them for patient. They can not do it because patient has medicaid. She can be reached at [phone] extension [phone].

## 2017-03-29 NOTE — Telephone Encounter (Signed)
The referral was previously sent electronically, but I will send it manually today to [place].

## 2017-03-29 NOTE — Telephone Encounter (Signed)
Copied from [place]. Topic: Referral - Status >> Mar 26, 2017  1:18 PM Hewitt Shorts wrote: Reason for CRM: pt is checking on status of referral she states that this should have been handled by the 1st  Best number [phone] >> Mar 28, 2017  2:09 PM Yvette Rack wrote: Pt states that she still haven't heard anything about sleep study referral she states that she has been waiting to hear from someone since first of Nov

## 2017-04-11 ENCOUNTER — Ambulatory Visit: Payer: Medicaid Other | Attending: Neurology

## 2017-04-11 DIAGNOSIS — G4733 Obstructive sleep apnea (adult) (pediatric): Secondary | ICD-10-CM | POA: Insufficient documentation

## 2017-04-11 DIAGNOSIS — R0683 Snoring: Secondary | ICD-10-CM | POA: Insufficient documentation

## 2017-04-12 ENCOUNTER — Ambulatory Visit: Payer: Medicaid Other | Admitting: Family Medicine

## 2017-04-12 ENCOUNTER — Encounter: Payer: Self-pay | Admitting: Family Medicine

## 2017-04-12 VITALS — BP 130/74 | HR 82 | Resp 16 | Ht 61.0 in | Wt 190.1 lb

## 2017-04-12 DIAGNOSIS — E1159 Type 2 diabetes mellitus with other circulatory complications: Secondary | ICD-10-CM | POA: Diagnosis not present

## 2017-04-12 DIAGNOSIS — G4736 Sleep related hypoventilation in conditions classified elsewhere: Secondary | ICD-10-CM | POA: Diagnosis not present

## 2017-04-12 DIAGNOSIS — J449 Chronic obstructive pulmonary disease, unspecified: Secondary | ICD-10-CM

## 2017-04-12 DIAGNOSIS — I209 Angina pectoris, unspecified: Secondary | ICD-10-CM

## 2017-04-12 DIAGNOSIS — Z9981 Dependence on supplemental oxygen: Secondary | ICD-10-CM | POA: Diagnosis not present

## 2017-04-12 DIAGNOSIS — G43009 Migraine without aura, not intractable, without status migrainosus: Secondary | ICD-10-CM | POA: Diagnosis not present

## 2017-04-12 LAB — POCT GLYCOSYLATED HEMOGLOBIN (HGB A1C): Hemoglobin A1C: 11.1

## 2017-04-12 MED ORDER — ERENUMAB-AOOE 70 MG/ML ~~LOC~~ SOAJ: 70.0000 mg | SUBCUTANEOUS | 2 refills | Status: DC

## 2017-04-12 MED ORDER — HYDROCODONE-ACETAMINOPHEN 10-325 MG PO TABS: 1.0000 | ORAL_TABLET | Freq: Four times a day (QID) | ORAL | 0 refills | Status: DC | PRN

## 2017-04-12 MED ORDER — ALBUTEROL SULFATE HFA 108 (90 BASE) MCG/ACT IN AERS: 2.0000 | INHALATION_SPRAY | Freq: Four times a day (QID) | RESPIRATORY_TRACT | 0 refills | Status: DC | PRN

## 2017-04-12 NOTE — Patient Instructions (Signed)
Research [employer]

## 2017-04-12 NOTE — Progress Notes (Signed)
Name: Barbara Bowen   MRN: [id number]    DOB: 1955-10-27   Date:04/12/2017       Progress Note  Subjective  Chief Complaint  Chief Complaint  Patient presents with  . Diabetes  . Hypertension  . COPD    HPI  Nocturnal hypoxemia/COPD/Asthma: she is still smoking, on average smokes 2-2.5 packs daily, she has daily  wet cough and SOB with activity, she has OSA and only using oxygen for hypoxemia because she could not get the oxygen and CPAP to work together. She states when she sleeps without oxygen she wakes up more tired, dry mouth and sometimes wakes herself up from sleep snoring. She needs to get orders for oxygen, and we will try again to get CPAP working since symptoms are likely secondary to OSA. She depends on nocturnal oxygen at this time. She had sleep study last night.   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 She was started on Metformin and Tanzeum in 2017 because hgbA1C spiked to above 8. She has been avoiding sweets since last visit,  we switched her to [place] back in December 2017  but she developed nausea and also had hypoglycemic episodes and stopped all medications, including statin therapy. She continues to skip meals, hgbA1C was above 13 back in September and we tried to re-start Xultophy but she was worried about hypoglycemia and never re-started medication, she has follow up with Endocrinologist next month, not currently on any medication and hgbA1C is down to 11.1%/  She is unable to exercise because of COPD/CAD. She denies polyphagia, polyuria but she has noticed  polydipsia. She has not been taking Crestor on a regular basis  but is taking Zetia, she has been on plavix and aspirin daily. Off ARB by choice  CAD: last cath done by Dr. Rockey Situ was in 2016 , stents still open,she takes Zetia and occasionally uses Crestor.  She states she uses NTG patch prn, and NTG orally prn  HTN: taking betablocker, but off Diovan ( stopped on her  own) Intermittent chest pain ( but seldom and mild) but palpitation is controlled with Bystolic. SOB secondary to COPD, but refuses to take maintenance medication, only using rescue inhaler.  .  Migraine headaches: She is taking care of her mother - she fell and she has been living with her in [place] She has been more stressed. Seen by neurologist but per patient can't tolerate any prophylactic medication, the only thing that works for her is hydrodocone prn, 30 pills to last 90 days. She tried Neurontin on her last visit but stopped on her own, states made her feel groggy. She has an average of 3 episodes per week. Pain is usually left frontal and radiates to occipital area, associated with nausea, occasional vomiting, photophobia and phonophobia.   Patient Active Problem List   Diagnosis Date Noted  . Dyslipidemia associated with type 2 diabetes mellitus ([place]) 06/28/2016  . Spinal stenosis 10/15/2015  . Bilateral tinnitus 08/23/2015  . B12 deficiency 03/31/2015  . OSA (obstructive sleep apnea) 03/31/2015  . 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 Intertrigo 02/23/2015  . Coronary artery disease   . Insomnia 12/29/2014  . Migraine without aura and without status migrainosus, not intractable 12/29/2014  . Leukocytosis 12/24/2014  . Macrocytic 12/24/2014  . Unstable angina ([place])   . Essential hypertension   . Paroxysmal atrial fibrillation (HCC)   . Diabetes mellitus with renal manifestations, uncontrolled ([place])   . History  of MI (myocardial infarction) 10/30/2013  . Benign neoplasm of breast 09/12/2012  . Nocturnal hypoxemia due to obesity 09/09/2012  . COPD (chronic obstructive pulmonary disease) ([place]) 09/09/2012  . Smoking 03/27/2011  . Hyperlipidemia 10/28/2009  . CAD (coronary artery disease), native coronary artery 10/28/2009  . Shortness of breath 10/28/2009  . Angina pectoris ([place]) 10/28/2009    Past Surgical History:  Procedure  Laterality Date  . ABDOMINAL HYSTERECTOMY    . APPENDECTOMY    . BREAST SURGERY Right 06-09-2012   biopsy x2 9 oclock and 11 oclock  . CARDIAC CATHETERIZATION  2009   s/p right coronary artery drug-eluting stent  . CARDIAC CATHETERIZATION N/A 12/16/2014   Procedure: Left Heart Cath and Coronary Angiography;  Surgeon: Minna Merritts, MD;  Location: [facility];  Service: Cardiovascular;  Laterality: N/A;  . CORONARY ANGIOPLASTY  2015  . exploratory thoracotomy    . MOLE REMOVAL      Family History  Problem Relation Age of Onset  . Heart disease Father        CAD  . Breast cancer Maternal Aunt   . Coronary artery disease Mother   . Diabetes type II Mother     Social History   Socioeconomic History  . Marital status: Married    Spouse name: Not on file  . Number of children: Not on file  . Years of education: Not on file  . Highest education level: Not on file  Social Needs  . Financial resource strain: Not on file  . Food insecurity - worry: Not on file  . Food insecurity - inability: Not on file  . Transportation needs - medical: Not on file  . Transportation needs - non-medical: Not on file  Occupational History  . Occupation: unemployed    Employer: [employer]EMPL[employer]  Tobacco Use  . Smoking status: Current Every Day Smoker    Packs/day: 2.00    Years: 37.00    Pack years: 74.00    Types: Cigarettes    Start date: 02/01/1978  . Smokeless tobacco: Never Used  Substance and Sexual Activity  . Alcohol use: No    Alcohol/week: 0.0 oz  . Drug use: No  . Sexual activity: Not Currently  Other Topics Concern  . Not on file  Social History Narrative   Lives in [place] with husband.  Does not work.  Does not routinely exercise.     Current Outpatient Medications:  .  albuterol (PROAIR HFA) 108 (90 Base) MCG/ACT inhaler, Inhale 2 puffs into the lungs every 6 (six) hours as needed., Disp: 1 Inhaler, Rfl: 0 .  aspirin 81 MG EC tablet, Take 81 mg by mouth daily. , Disp:  , Rfl:  .  BD ULTRA-FINE LANCETS lancets, Use as instructed, Disp: 100 each, Rfl: 12 .  clopidogrel (PLAVIX) 75 MG tablet, TAKE 1 TABLET (75 MG TOTAL) BY MOUTH DAILY., Disp: 90 tablet, Rfl: 3 .  ezetimibe (ZETIA) 10 MG tablet, Take 1 tablet (10 mg total) by mouth daily., Disp: 90 tablet, Rfl: 3 .  glucose blood (ONE TOUCH ULTRA TEST) test strip, Use as instructed, Disp: 100 each, Rfl: 12 .  HYDROcodone-acetaminophen (NORCO) 10-325 MG tablet, Take 1 tablet by mouth every 6 (six) hours as needed. Take 1 tablet daily as needed for headaches, Disp: 30 tablet, Rfl: 0 .  Insulin Pen Needle (NOVOFINE) 30G X 8 MM MISC, Inject 10 each into the skin as needed., Disp: 100 each, Rfl: 0 .  nebivolol (BYSTOLIC) 10 MG  tablet, Take 1 tablet (10 mg total) by mouth daily., Disp: 90 tablet, Rfl: 3 .  nitroGLYCERIN (NITRO-DUR) 0.2 mg/hr patch, Place 1 patch (0.2 mg total) onto the skin daily. (Patient not taking: Reported on 02/22/2017), Disp: 30 patch, Rfl: 6 .  nitroGLYCERIN (NITROSTAT) 0.4 MG SL tablet, Place 0.4 mg under the tongue every 5 (five) minutes as needed for chest pain., Disp: , Rfl:  .  rosuvastatin (CRESTOR) 40 MG tablet, Take 1 tablet (40 mg total) by mouth daily., Disp: 90 tablet, Rfl: 3  Allergies  Allergen Reactions  . Amoxicillin-Pot Clavulanate     Vomiting   . Cefprozil     Vomiting   . Eggs Or Egg-Derived Products     Hives   . Esomeprazole Magnesium     unknown  . Montelukast Sodium     unknown  . Omeprazole     unknown  . Penicillins     Vomiting blood  . Prednisone     Rapid heart beat & difficulty breathing  . Spiriva [Tiotropium Bromide Monohydrate]     HA  . Advair Diskus [Fluticasone-Salmeterol] Palpitations    Also happened with Symbicort     ROS  Constitutional: Negative for fever or weight change.  Respiratory: Negative for cough and shortness of breath.   Cardiovascular: Negative for chest pain or palpitations.  Gastrointestinal: Negative for abdominal pain,  no bowel changes.  Musculoskeletal: Negative for gait problem or joint swelling.  Skin: Negative for rash.  Neurological: Negative for dizziness or headache.  No other specific complaints in a complete review of systems (except as listed in HPI above).  Objective  Vitals:   04/12/17 1434  BP: 130/74  Pulse: 82  Resp: 16  SpO2: 96%  Weight: 190 lb 1.6 oz (86.2 kg)  Height: '5\' 1"'$  (1.549 m)    Body mass index is 35.92 kg/m.  Physical Exam  Constitutional: Patient appears well-developed and well-nourished. Obese No distress.  HEENT: head atraumatic, normocephalic, pupils equal and reactive to light,  neck supple, throat within normal limits Cardiovascular: Normal rate, regular rhythm and normal heart sounds.  No murmur heard. No BLE edema. Pulmonary/Chest: Effort normal and breath sounds normal. No respiratory distress. Abdominal: Soft.  There is no tenderness. Psychiatric: Patient has a normal mood and affect. behavior is normal. Judgment and thought content normal. Neurological: no focal findings.   Recent Results (from the past 2160 hour(s))  Basic metabolic panel     Status: Abnormal   Collection Time: 02/22/17  7:30 PM  Result Value Ref Range   Sodium 133 (L) 135 - 145 mmol/L   Potassium 3.7 3.5 - 5.1 mmol/L   Chloride 96 (L) 101 - 111 mmol/L   CO2 28 22 - 32 mmol/L   Glucose, Bld 282 (H) 65 - 99 mg/dL   BUN 9 6 - 20 mg/dL   Creatinine, Ser 0.70 0.44 - 1.00 mg/dL   Calcium 9.5 8.9 - 10.3 mg/dL   GFR calc non Af Amer >60 >60 mL/min   GFR calc Af Amer >60 >60 mL/min    Comment: (NOTE) The eGFR has been calculated using the CKD EPI equation. This calculation has not been validated in all clinical situations. eGFR's persistently <60 mL/min signify possible Chronic Kidney Disease.    Anion gap 9 5 - 15  CBC     Status: Abnormal   Collection Time: 02/22/17  7:30 PM  Result Value Ref Range   WBC 14.2 (H) 3.6 - 11.0 K/uL  RBC 4.85 3.80 - 5.20 MIL/uL   Hemoglobin  16.6 (H) 12.0 - 16.0 g/dL   HCT 48.1 (H) 35.0 - 47.0 %   MCV 99.3 80.0 - 100.0 fL   MCH 34.2 (H) 26.0 - 34.0 pg   MCHC 34.4 32.0 - 36.0 g/dL   RDW 12.9 11.5 - 14.5 %   Platelets 221 150 - 440 K/uL  Troponin I     Status: None   Collection Time: 02/22/17  7:30 PM  Result Value Ref Range   Troponin I <0.03 <0.03 ng/mL  Troponin I     Status: None   Collection Time: 02/22/17 11:26 PM  Result Value Ref Range   Troponin I <0.03 <0.03 ng/mL  POCT HgB A1C     Status: Abnormal   Collection Time: 04/12/17  2:49 PM  Result Value Ref Range   Hemoglobin A1C 11.1       PHQ2/9: Depression screen [facility] 2/9 06/28/2016 03/29/2016 11/03/2015 09/30/2015 08/23/2015  Decreased Interest 0 0 0 0 0  Down, Depressed, Hopeless 0 0 0 0 0  PHQ - 2 Score 0 0 0 0 0     Fall Risk: Fall Risk  04/12/2017 03/06/2017 06/28/2016 03/29/2016 11/03/2015  Falls in the past year? No No Yes Yes Yes  Comment - - - - accidentally patient off her step  Number falls in past yr: - - 2 or more 1 1  Injury with Fall? - - Yes No No  Follow up - - - Falls evaluation completed -     Functional Status Survey: Is the patient deaf or have difficulty hearing?: No Does the patient have difficulty seeing, even when wearing glasses/contacts?: No Does the patient have difficulty concentrating, remembering, or making decisions?: No Does the patient have difficulty walking or climbing stairs?: No Does the patient have difficulty dressing or bathing?: No Does the patient have difficulty doing errands alone such as visiting a doctor's office or shopping?: No    Assessment & Plan  1. Type 2 diabetes mellitus with other circulatory complications (HCC)  - POCT HgB A1C ,improving but did not start any medication  2. Chronic obstructive pulmonary disease, unspecified COPD type (HCC)  - albuterol (PROAIR HFA) 108 (90 Base) MCG/ACT inhaler; Inhale 2 puffs into the lungs every 6 (six) hours as needed.  Dispense: 1 Inhaler; Refill: 0  3.  Migraine without aura and without status migrainosus, not intractable  Discuss need to come off Norco, we will try Aimovig, she refused sample today, she wants to read about it first.  - HYDROcodone-acetaminophen (NORCO) 10-325 MG tablet; Take 1 tablet by mouth every 6 (six) hours as needed. Take 1 tablet daily as needed for headaches  Dispense: 30 tablet; Refill: 0 - Erenumab-aooe (AIMOVIG) 70 MG/ML SOAJ; Inject 70 mg into the skin every 14 (fourteen) days.  Dispense: 2 mL; Refill: 2  4. 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 Angina pectoris (HCC)  Sees Dr. Candis Musa, no recent episodes of chest pain, last one about 4 months ago   6. Dependence on nocturnal oxygen therapy  Using oxygen therapy at night, had another sleep study done last night.

## 2017-04-26 ENCOUNTER — Telehealth: Payer: Self-pay | Admitting: Cardiovascular Disease

## 2017-04-26 NOTE — Telephone Encounter (Signed)
S/w Selena at [facility] regarding PA request for ezetimibe. She does not see the request in her system and states that generic can be filled with no PA. Reference #: [id number] [facility]. Pt last picked up zetia in August. Still needs PA for generic. S/w Selena at [facility]. They do not have a PA requests and she suggests I ask [facility] to call [facility]. Reference# : [id number]. [facility] to [facility]. Janett Billow ran medication through and it has been approved w/no PA. Marland Kitchenl.  Attempted to notify pt. No answer, no VM.

## 2017-05-07 DIAGNOSIS — E785 Hyperlipidemia, unspecified: Secondary | ICD-10-CM

## 2017-05-07 DIAGNOSIS — E1169 Type 2 diabetes mellitus with other specified complication: Secondary | ICD-10-CM | POA: Insufficient documentation

## 2017-05-07 DIAGNOSIS — E559 Vitamin D deficiency, unspecified: Secondary | ICD-10-CM | POA: Insufficient documentation

## 2017-06-03 ENCOUNTER — Telehealth: Payer: Self-pay | Admitting: Cardiovascular Disease

## 2017-06-03 NOTE — Telephone Encounter (Signed)
Called [facility]. They said had faxed over PA request although we have not received yet. Confirmed fax number and they had a different number. They will refax the request to correct fax number.    Patient not home. S/w husband, ok per DPR and let him know we are in the process of working on the request.

## 2017-06-03 NOTE — Telephone Encounter (Signed)
[facility] calling stating [facility] [place] [facility] a PA done on Bystolic that patient has been taking for the last three months   Please advise.

## 2017-06-04 NOTE — Telephone Encounter (Signed)
S/w [facility] rep and submitted PA request for Bystolic. It is documented in telephone entry from 02/2011 that patient has tried and failed other beta blockers and diltiazem. The information given to representative who will send this to pharmacist for approval as patient has been on Bystolic since 8638.  [place] [phone].  Rep suggested to call back after 24 hours to check status of PA. [phone]. Awaiting approval.

## 2017-06-05 ENCOUNTER — Other Ambulatory Visit: Payer: Self-pay | Admitting: Family Medicine

## 2017-06-05 DIAGNOSIS — J449 Chronic obstructive pulmonary disease, unspecified: Secondary | ICD-10-CM

## 2017-06-05 NOTE — Telephone Encounter (Signed)
S/w [facility]. Patient's Bystolic was approved from 06/04/2017 to 05/30/2018. Auth # [id number]. Interaction ID# [id number].   Notified patient that Bystolic was approved.

## 2017-06-05 NOTE — Telephone Encounter (Signed)
[facility]ill request for general medication: [facility]air [facility]   Last office visit: 04/12/2018   Follow up on 07/12/2017

## 2017-06-23 ENCOUNTER — Other Ambulatory Visit: Payer: Self-pay

## 2017-06-23 ENCOUNTER — Emergency Department: Payer: Medicaid Other

## 2017-06-23 ENCOUNTER — Emergency Department
Admission: EM | Admit: 2017-06-23 | Discharge: 2017-06-23 | Disposition: A | Discharge status: Home / Self Care | Payer: Medicaid Other | Attending: Emergency Medicine | Admitting: Emergency Medicine

## 2017-06-23 ENCOUNTER — Encounter: Payer: Self-pay | Admitting: Emergency Medicine

## 2017-06-23 DIAGNOSIS — Z79899 Other long term (current) drug therapy: Secondary | ICD-10-CM | POA: Insufficient documentation

## 2017-06-23 DIAGNOSIS — E119 Type 2 diabetes mellitus without complications: Secondary | ICD-10-CM | POA: Diagnosis not present

## 2017-06-23 DIAGNOSIS — Z794 Long term (current) use of insulin: Secondary | ICD-10-CM | POA: Insufficient documentation

## 2017-06-23 DIAGNOSIS — Z8673 Personal history of transient ischemic attack (TIA), and cerebral infarction without residual deficits: Secondary | ICD-10-CM | POA: Insufficient documentation

## 2017-06-23 DIAGNOSIS — S60221A Contusion of right hand, initial encounter: Secondary | ICD-10-CM | POA: Diagnosis not present

## 2017-06-23 DIAGNOSIS — I252 Old myocardial infarction: Secondary | ICD-10-CM | POA: Diagnosis not present

## 2017-06-23 DIAGNOSIS — Y939 Activity, unspecified: Secondary | ICD-10-CM | POA: Diagnosis not present

## 2017-06-23 DIAGNOSIS — I509 Heart failure, unspecified: Secondary | ICD-10-CM | POA: Insufficient documentation

## 2017-06-23 DIAGNOSIS — Z7982 Long term (current) use of aspirin: Secondary | ICD-10-CM | POA: Insufficient documentation

## 2017-06-23 DIAGNOSIS — Y929 Unspecified place or not applicable: Secondary | ICD-10-CM | POA: Insufficient documentation

## 2017-06-23 DIAGNOSIS — Y33XXXA Other specified events, undetermined intent, initial encounter: Secondary | ICD-10-CM | POA: Diagnosis not present

## 2017-06-23 DIAGNOSIS — E785 Hyperlipidemia, unspecified: Secondary | ICD-10-CM | POA: Insufficient documentation

## 2017-06-23 DIAGNOSIS — R58 Hemorrhage, not elsewhere classified: Secondary | ICD-10-CM

## 2017-06-23 DIAGNOSIS — F17210 Nicotine dependence, cigarettes, uncomplicated: Secondary | ICD-10-CM | POA: Diagnosis not present

## 2017-06-23 DIAGNOSIS — I480 Paroxysmal atrial fibrillation: Secondary | ICD-10-CM | POA: Insufficient documentation

## 2017-06-23 DIAGNOSIS — Z7902 Long term (current) use of antithrombotics/antiplatelets: Secondary | ICD-10-CM | POA: Insufficient documentation

## 2017-06-23 DIAGNOSIS — I2510 Atherosclerotic heart disease of native coronary artery without angina pectoris: Secondary | ICD-10-CM | POA: Diagnosis not present

## 2017-06-23 DIAGNOSIS — Y998 Other external cause status: Secondary | ICD-10-CM | POA: Insufficient documentation

## 2017-06-23 DIAGNOSIS — I110 Hypertensive heart disease with heart failure: Secondary | ICD-10-CM | POA: Insufficient documentation

## 2017-06-23 DIAGNOSIS — J449 Chronic obstructive pulmonary disease, unspecified: Secondary | ICD-10-CM | POA: Insufficient documentation

## 2017-06-23 NOTE — ED Triage Notes (Signed)
Pt reports a few nights ago feeling a pop in her right hand, noticed bruising to right palm area, c/o dull pain.

## 2017-06-23 NOTE — ED Provider Notes (Signed)
[facility] Emergency Department Provider Note  ____________________________________________   First MD Initiated Contact with Patient 06/23/17 1018     (approximate)  I have reviewed the triage vital signs and the nursing notes.   HISTORY  Chief Complaint Hand Injury  HPI Barbara Bowen is a 62 y.o. female is here with complaint of right hand bruise without history of injury.  Patient states that several nights ago she felt a "pop" in her right hand.  Patient currently takes 4 aspirin a day due to cardiac reasons.  She was instructed to do so by her doctor.  He is unaware of any injury to her hand.  She denies any other areas or bleeding.  She rates her pain as a 2 out of 10.   Past Medical History:  Diagnosis Date  . Anxiety   . CHF (congestive heart failure) ([place])   . COPD (chronic obstructive pulmonary disease) ([place])   . Coronary artery disease    a. 09/2007 s/p PCI/DES to prox RCA;  b. 2012 Cath: nonobs dzs;  c. 01/2014 PCI: 95d (3.0x23 Xience [place]), RPDA 80 (PTCA), EF 60%;  d. 11/2014 Cath: LM anomalous, LAD/D1/D2 min irregs, LCX small, min irregs, OM2 small, RCA 20p ISR, 10d ISR, EF 65%.  . DM type 2 (diabetes mellitus, type 2) ([place])   . Enthesopathy of hip region   . Essential hypertension   . Headache   . Hx-TIA (transient ischemic attack)   . Hyperlipidemia   . Insomnia, unspecified   . Leukocytosis, unspecified   . Lipoma of other skin and subcutaneous tissue   . OA (osteoarthritis)   . Occlusion and stenosis of carotid artery without mention of cerebral infarction   . Palpitations   . Paroxysmal atrial fibrillation (HCC)    a. not appreciated on holter 09/2013;  b. CHA2DS2VASc = 5 (htn, DM, h/o TIA, vasc dzs, female) - not on [place].  Marland Kitchen Plantar fascial fibromatosis   . Proteinuria   . Stroke ([place])   . Tobacco use disorder    a. does not believe cigarettes contribute to her dyspnea/asthma.    Marland Kitchen Unspecified sleep apnea    a. Wears O2 via  [place] @ [facility].  Marland Kitchen Unspecified tinnitus     Patient Active Problem List   Diagnosis Date Noted  . Dependence on nocturnal oxygen therapy 04/12/2017  . Dyslipidemia associated with type 2 diabetes mellitus ([place]) 06/28/2016  . Spinal stenosis 10/15/2015  . Bilateral tinnitus 08/23/2015  . B12 deficiency 03/31/2015  . OSA (obstructive sleep apnea) 03/31/2015  . 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 Intertrigo 02/23/2015  . Coronary artery disease   . Insomnia 12/29/2014  . Migraine without aura and without status migrainosus, not intractable 12/29/2014  . Leukocytosis 12/24/2014  . Macrocytic 12/24/2014  . Unstable angina ([place])   . Essential hypertension   . Paroxysmal atrial fibrillation (HCC)   . Diabetes mellitus with renal manifestations, uncontrolled ([place])   . History of MI (myocardial infarction) 10/30/2013  . Benign neoplasm of breast 09/12/2012  . Nocturnal hypoxemia due to obesity 09/09/2012  . COPD (chronic obstructive pulmonary disease) ([place]) 09/09/2012  . Smoking 03/27/2011  . Hyperlipidemia 10/28/2009  . CAD (coronary artery disease), native coronary artery 10/28/2009  . Shortness of breath 10/28/2009  . Angina pectoris ([place]) 10/28/2009    Past Surgical History:  Procedure Laterality Date  . ABDOMINAL HYSTERECTOMY    . APPENDECTOMY    . BREAST SURGERY Right  06-09-2012   biopsy x2 9 oclock and 11 oclock  . CARDIAC CATHETERIZATION  2009   s/p right coronary artery drug-eluting stent  . CARDIAC CATHETERIZATION N/A 12/16/2014   Procedure: Left Heart Cath and Coronary Angiography;  Surgeon: Minna Merritts, MD;  Location: [facility];  Service: Cardiovascular;  Laterality: N/A;  . CORONARY ANGIOPLASTY  2015  . exploratory thoracotomy    . MOLE REMOVAL      Prior to Admission medications   Medication Sig Start Date End Date Taking? Authorizing Provider  albuterol (PROVENTIL HFA;VENTOLIN HFA) 108 (90 Base) MCG/ACT inhaler  TAKE 2 PUFFS BY MOUTH EVERY 6 HOURS AS NEEDED 06/05/17   Steele Sizer, MD  aspirin 81 MG EC tablet Take 81 mg by mouth daily.     [provider]  BD ULTRA-FINE LANCETS lancets Use as instructed 01/14/17   Steele Sizer, MD  clopidogrel (PLAVIX) 75 MG tablet TAKE 1 TABLET (75 MG TOTAL) BY MOUTH DAILY. 08/24/16   Minna Merritts, MD  Erenumab-aooe (AIMOVIG) 70 MG/ML SOAJ Inject 70 mg into the skin every 14 (fourteen) days. 04/12/17   Steele Sizer, MD  ezetimibe (ZETIA) 10 MG tablet Take 1 tablet (10 mg total) by mouth daily. 08/24/16   Minna Merritts, MD  glucose blood (ONE TOUCH ULTRA TEST) test strip Use as instructed 06/28/16   Steele Sizer, MD  Insulin Pen Needle (NOVOFINE) 30G X 8 MM MISC Inject 10 each into the skin as needed. 01/11/17   Steele Sizer, MD  nebivolol (BYSTOLIC) 10 MG tablet Take 1 tablet (10 mg total) by mouth daily. 08/24/16   Minna Merritts, MD  nitroGLYCERIN (NITRO-DUR) 0.2 mg/hr patch Place 1 patch (0.2 mg total) onto the skin daily. Patient not taking: Reported on 02/22/2017 01/04/15   Minna Merritts, MD  nitroGLYCERIN (NITROSTAT) 0.4 MG SL tablet Place 0.4 mg under the tongue every 5 (five) minutes as needed for chest pain.    [provider]  rosuvastatin (CRESTOR) 40 MG tablet Take 1 tablet (40 mg total) by mouth daily. 08/24/16   Minna Merritts, MD    Allergies Amoxicillin-pot clavulanate; Cefprozil; Eggs or egg-derived products; Esomeprazole magnesium; Montelukast sodium; Omeprazole; Penicillins; Prednisone; Spiriva [tiotropium bromide monohydrate]; and Advair diskus [fluticasone-salmeterol]  Family History  Problem Relation Age of Onset  . Heart disease Father        CAD  . Breast cancer Maternal Aunt   . Coronary artery disease Mother   . Diabetes type II Mother     Social History Social History   Tobacco Use  . Smoking status: Current Every Day Smoker    Packs/day: 2.00    Years: 37.00    Pack years: 74.00    Types:  Cigarettes    Start date: 02/01/1978  . Smokeless tobacco: Never Used  Substance Use Topics  . Alcohol use: No    Alcohol/week: 0.0 oz  . Drug use: No    Review of Systems Constitutional: No fever/chills Cardiovascular: Denies chest pain. Respiratory: Denies shortness of breath. Musculoskeletal: Negative for hand pain. Skin: Ecchymosis present on the right hand. Neurological: Negative for headaches, focal weakness or numbness. ____________________________________________   PHYSICAL EXAM:  VITAL SIGNS: ED Triage Vitals  Enc Vitals Group     BP 06/23/17 1002 130/74     Pulse Rate 06/23/17 1002 70     Resp 06/23/17 1002 18     Temp 06/23/17 1002 98.1 F (36.7 C)     Temp Source 06/23/17  1002 Oral     SpO2 06/23/17 1002 94 %     Weight 06/23/17 1003 200 lb (90.7 kg)     Height 06/23/17 1003 5' 0.5" (1.537 m)     Head Circumference --      Peak Flow --      Pain Score 06/23/17 1003 2     Pain Loc --      Pain Edu? --      Excl. in [place]? --     Constitutional: Alert and oriented. Well appearing and in no acute distress. Eyes: Conjunctivae are normal.  Head: Atraumatic. Neck: No stridor.   Cardiovascular: Normal rate, regular rhythm. Grossly normal heart sounds.  Good peripheral circulation. Respiratory: Normal respiratory effort.  No retractions. Lungs CTAB. Musculoskeletal: On examination of the hand there is no gross deformity however there is an ecchymotic area at the base of the hyper thenar eminence area.  Area is minimally tender to touch.  Patient motor sensory function is intact distal to this area and patient is able to touch her thumb to each finger without any difficulty.  Skin is otherwise intact.  No warmth is present. Neurologic:  Normal speech and language. No gross focal neurologic deficits are appreciated.  Skin:  Skin is warm, dry and intact.  Ecchymosis as noted above. Psychiatric: Mood and affect are normal. Speech and behavior are  normal.  ____________________________________________   LABS (all labs ordered are listed, but only abnormal results are displayed)  Labs Reviewed - No data to display  RADIOLOGY  ED MD interpretation:   Right hand x-ray is negative for acute bony abnormalities.  Official radiology report(s): Dg Hand Complete Right  Result Date: 06/23/2017 CLINICAL DATA:  Felt a pop in the right hand. Bruising of the right palm. EXAM: RIGHT HAND - COMPLETE 3+ VIEW COMPARISON:  None. FINDINGS: There is no evidence of fracture or dislocation. There is no evidence of arthropathy or other focal bone abnormality. Soft tissues are unremarkable. IMPRESSION: No acute osseous injury of the right hand. Electronically Signed   By: Kathreen Devoid   On: 06/23/2017 10:44    ____________________________________________   PROCEDURES  Procedure(s) performed: None  Procedures  Critical Care performed: No  ____________________________________________   INITIAL IMPRESSION / ASSESSMENT AND PLAN / ED COURSE Patient was made aware that most likely the ecchymosis is secondary to her aspirin ranging for her cardiac problems.  Patient is to ice and elevate her hand as needed.  She was placed in an Ace wrap for protection.  She is to follow-up with her PCP if any continued problems.  ____________________________________________   FINAL CLINICAL IMPRESSION(S) / ED DIAGNOSES  Final diagnoses:  Ecchymosis     ED Discharge Orders    None       Note:  This document was prepared using Dragon voice recognition software and may include unintentional dictation errors.    Johnn Hai, PA-C 06/23/17 1532    Schaevitz, Randall An, MD 06/27/17 [id number]

## 2017-06-23 NOTE — ED Notes (Signed)
See triage note  presents with bruising to palm of right hand  Min swelling noted  denies any injury  Good pulses

## 2017-06-23 NOTE — Discharge Instructions (Signed)
Ice and elevate right hand as needed for bruising.  Wear Ace wrap for protection.  Follow-up with your primary care provider if any continued problems or worsening concerns.

## 2017-06-23 NOTE — ED Notes (Signed)
See triage note.

## 2017-07-12 ENCOUNTER — Ambulatory Visit: Payer: Medicaid Other | Admitting: Family Medicine

## 2017-07-12 ENCOUNTER — Encounter: Payer: Self-pay | Admitting: Family Medicine

## 2017-07-12 VITALS — BP 120/62 | HR 75 | Resp 14 | Ht 61.0 in | Wt 186.0 lb

## 2017-07-12 DIAGNOSIS — G4733 Obstructive sleep apnea (adult) (pediatric): Secondary | ICD-10-CM | POA: Diagnosis not present

## 2017-07-12 DIAGNOSIS — E1165 Type 2 diabetes mellitus with hyperglycemia: Secondary | ICD-10-CM

## 2017-07-12 DIAGNOSIS — G4736 Sleep related hypoventilation in conditions classified elsewhere: Secondary | ICD-10-CM | POA: Diagnosis not present

## 2017-07-12 DIAGNOSIS — I209 Angina pectoris, unspecified: Secondary | ICD-10-CM | POA: Diagnosis not present

## 2017-07-12 DIAGNOSIS — E1129 Type 2 diabetes mellitus with other diabetic kidney complication: Secondary | ICD-10-CM

## 2017-07-12 DIAGNOSIS — G43009 Migraine without aura, not intractable, without status migrainosus: Secondary | ICD-10-CM | POA: Diagnosis not present

## 2017-07-12 DIAGNOSIS — Z1231 Encounter for screening mammogram for malignant neoplasm of breast: Secondary | ICD-10-CM | POA: Diagnosis not present

## 2017-07-12 DIAGNOSIS — Z1239 Encounter for other screening for malignant neoplasm of breast: Secondary | ICD-10-CM

## 2017-07-12 DIAGNOSIS — L02412 Cutaneous abscess of left axilla: Secondary | ICD-10-CM | POA: Diagnosis not present

## 2017-07-12 DIAGNOSIS — I480 Paroxysmal atrial fibrillation: Secondary | ICD-10-CM | POA: Diagnosis not present

## 2017-07-12 DIAGNOSIS — J449 Chronic obstructive pulmonary disease, unspecified: Secondary | ICD-10-CM

## 2017-07-12 DIAGNOSIS — IMO0002 Reserved for concepts with insufficient information to code with codable children: Secondary | ICD-10-CM

## 2017-07-12 LAB — POCT GLYCOSYLATED HEMOGLOBIN (HGB A1C)

## 2017-07-12 MED ORDER — DOXYCYCLINE HYCLATE 100 MG PO TABS: 100.0000 mg | ORAL_TABLET | Freq: Two times a day (BID) | ORAL | 0 refills | Status: DC

## 2017-07-12 NOTE — Progress Notes (Signed)
Name: Barbara Bowen   MRN: [id number]    DOB: [birth year]   Date:07/12/2017       Progress Note  Subjective  Chief Complaint  Chief Complaint  Patient presents with  . COPD  . Diabetes  . Hypertension  . Hyperlipidemia    HPI  Nocturnal hypoxemia/COPD/Asthma: she is still smoking, on average smokes 2-2.5 packs daily, she is not interested in quitting smoking. She has daily wet cough and SOB with activity, she has OSA  But not significant enough to qualify for CPAP. She has nocturnal  oxygen for hypoxemia. She states when she sleeps without oxygen she wakes up more tired, dry mouth and sometimes wakes herself up from sleep snoring. Last sleep study was done 03/2017.She refused to use any inhalers because of previous side effects of trush with Advair and palpitation with Spiriva.   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 She was started on Metformin and Tanzeum in 2017 because hgbA1C spiked to above 8. She has been avoiding sweets since last visit,we switched her to [place] back in December 2017  but she developed nausea and also had hypoglycemic episodes and stopped all medications, including statin therapy. HbA1C was above 13 back in September and we tried to re-start Xultophy but she was worried about hypoglycemia and never re-started medication, but  hgbA1C was down to 11.1% without any medication back in Dec. She is unable to exercise because of COPD/CAD. She denies polyphagia, polyuria or polydipsia. She saw Endocrinologist Dr. Honor Junes January 2015 and was given Bydureon and metformin, she has been Metformin but Bydureon was not approved by insurance, today hgbA1C is above 14%, explained that she needs to contact Dr. Honor Junes and follow diabetic diet.  She has not been taking Crestor on a regular basis  but is takingZetia, she has been on plavix and aspirin daily. Off ARB by choice  CAD: last cath done by Dr. Rockey Situ was in 2016 , stents were  still open,she takes Zetia and occasionally uses Crestor because she forgets to take it, taking aspirin as recommend .  She states she uses NTG patch prn,andNTG orallyprn She states last chest pain episode was a couple weeks ago, she used the patch and resolved within a few hours.   HTN: taking betablocker, but off Diovan ( stopped on her own) Intermittent chest pain( but seldom and mild)but palpitation is controlled with Bystolic. SOB secondary to COPD, but refuses to take maintenance medication, only using rescue inhaler.  .  Migraine headaches: She is taking care of her mother - she fell and she has been living with her in [place] She has been more stressed. Seen by neurologist but per patient can't tolerate any prophylactic medication, the only thing that works for her is hydrodocone prn, but we stopped medication on her last visit and symptoms unchanged. She tried Neurontin on her last visit but stopped on her own, states made her feel groggy. She has an average of 3 episodes per week. Pain is usually left frontal and radiates to occipital area, associated with nausea, occasional vomiting, photophobia and phonophobia.    Abscess: left axilla, started a couple of months ago, some drainage , but started to go down in size, would like antibiotic for it.   Patient Active Problem List   Diagnosis Date Noted  . Vitamin D deficiency, unspecified 05/07/2017  . Hyperlipidemia due to type 2 diabetes mellitus ([place]) 05/07/2017  . Dependence on nocturnal oxygen therapy 04/12/2017  . Dyslipidemia associated with  type 2 diabetes mellitus ([place]) 06/28/2016  . Spinal stenosis 10/15/2015  . Bilateral tinnitus 08/23/2015  . B12 deficiency 03/31/2015  . OSA (obstructive sleep apnea) 03/31/2015  . 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 Intertrigo 02/23/2015  . Insomnia 12/29/2014  . Migraine without aura and without status migrainosus, not intractable  12/29/2014  . Leukocytosis 12/24/2014  . Macrocytic 12/24/2014  . Essential hypertension   . Paroxysmal atrial fibrillation (HCC)   . Diabetes mellitus with renal manifestations, uncontrolled ([place])   . History of MI (myocardial infarction) 10/30/2013  . Benign neoplasm of breast 09/12/2012  . COPD (chronic obstructive pulmonary disease) ([place]) 09/09/2012  . Smoking 03/27/2011  . Hyperlipidemia 10/28/2009  . CAD (coronary artery disease), native coronary artery 10/28/2009  . Shortness of breath 10/28/2009  . Angina pectoris ([place]) 10/28/2009    Past Surgical History:  Procedure Laterality Date  . ABDOMINAL HYSTERECTOMY    . APPENDECTOMY    . BREAST SURGERY Right 06-09-2012   biopsy x2 9 oclock and 11 oclock  . CARDIAC CATHETERIZATION  2009   s/p right coronary artery drug-eluting stent  . CARDIAC CATHETERIZATION N/A 12/16/2014   Procedure: Left Heart Cath and Coronary Angiography;  Surgeon: Minna Merritts, MD;  Location: [facility];  Service: Cardiovascular;  Laterality: N/A;  . CORONARY ANGIOPLASTY  2015  . exploratory thoracotomy    . MOLE REMOVAL      Family History  Problem Relation Age of Onset  . Heart disease Father        CAD  . Breast cancer Maternal Aunt   . Coronary artery disease Mother   . Diabetes type II Mother     Social History   Socioeconomic History  . Marital status: Married    Spouse name: Not on file  . Number of children: Not on file  . Years of education: Not on file  . Highest education level: Not on file  Occupational History  . Occupation: unemployed    Employer: UNEMPL[employer]  Social Needs  . Financial resource strain: Not on file  . Food insecurity:    Worry: Not on file    Inability: Not on file  . Transportation needs:    Medical: Not on file    Non-medical: Not on file  Tobacco Use  . Smoking status: Current Every Day Smoker    Packs/day: 2.00    Years: 37.00    Pack years: 74.00    Types: Cigarettes    Start date:  02/01/1978  . Smokeless tobacco: Never Used  Substance and Sexual Activity  . Alcohol use: No    Alcohol/week: 0.0 oz  . Drug use: No  . Sexual activity: Not Currently  Lifestyle  . Physical activity:    Days per week: Not on file    Minutes per session: Not on file  . Stress: Not on file  Relationships  . Social connections:    Talks on phone: Not on file    Gets together: Not on file    Attends religious service: Not on file    Active member of club or organization: Not on file    Attends meetings of clubs or organizations: Not on file    Relationship status: Not on file  . Intimate partner violence:    Fear of current or ex partner: Not on file    Emotionally abused: Not on file    Physically abused: Not on file    Forced sexual activity:  Not on file  Other Topics Concern  . Not on file  Social History Narrative   Lives in [place] with husband.  Does not work.  Does not routinely exercise.     Current Outpatient Medications:  .  albuterol (PROVENTIL HFA;VENTOLIN HFA) 108 (90 Base) MCG/ACT inhaler, TAKE 2 PUFFS BY MOUTH EVERY 6 HOURS AS NEEDED, Disp: 8.5 Inhaler, Rfl: 0 .  aspirin 81 MG EC tablet, Take 81 mg by mouth daily. , Disp: , Rfl:  .  BD ULTRA-FINE LANCETS lancets, Use as instructed, Disp: 100 each, Rfl: 12 .  clopidogrel (PLAVIX) 75 MG tablet, TAKE 1 TABLET (75 MG TOTAL) BY MOUTH DAILY., Disp: 90 tablet, Rfl: 3 .  ezetimibe (ZETIA) 10 MG tablet, Take 1 tablet (10 mg total) by mouth daily., Disp: 90 tablet, Rfl: 3 .  glucose blood (ONE TOUCH ULTRA TEST) test strip, Use as instructed, Disp: 100 each, Rfl: 12 .  metFORMIN (GLUCOPHAGE-XR) 500 MG 24 hr tablet, Take 500 mg by mouth 2 (two) times daily., Disp: , Rfl: 6 .  rosuvastatin (CRESTOR) 40 MG tablet, Take 1 tablet (40 mg total) by mouth daily., Disp: 90 tablet, Rfl: 3 .  nitroGLYCERIN (NITRO-DUR) 0.2 mg/hr patch, Place 1 patch (0.2 mg total) onto the skin daily., Disp: 30 patch, Rfl: 6  Allergies  Allergen  Reactions  . Amoxicillin-Pot Clavulanate     Vomiting   . Cefprozil     Vomiting   . Eggs Or Egg-Derived Products     Hives   . Esomeprazole Magnesium     unknown  . Montelukast Sodium     unknown  . Omeprazole     unknown  . Penicillins     Vomiting blood  . Prednisone     Rapid heart beat & difficulty breathing  . Spiriva [Tiotropium Bromide Monohydrate]     HA  . Advair Diskus [Fluticasone-Salmeterol] Palpitations    Also happened with Symbicort     ROS  Constitutional: Negative for fever , positive for  weight change.  Respiratory: Negative for cough , positive for shortness of breath and wheezing .   Cardiovascular: Negative for chest pain , but has noticed some  Palpitations lately   Gastrointestinal: Negative for abdominal pain, no bowel changes.  Musculoskeletal: Negative for gait problem or joint swelling.  Skin: Negative for rash.  Neurological: Negative for dizziness or headache.  No other specific complaints in a complete review of systems (except as listed in HPI above).  Objective  Vitals:   07/12/17 1359  BP: 120/62  Pulse: 75  Resp: 14  SpO2: 96%  Weight: 186 lb (84.4 kg)  Height: 5\' 1"  (1.549 m)    Body mass index is 35.14 kg/m.  Physical Exam  Constitutional: Patient appears well-developed and well-nourished. Obese  No distress.  HEENT: head atraumatic, normocephalic, pupils equal and reactive to light,neck supple, throat within normal limits Cardiovascular: Normal rate, regular rhythm and normal heart sounds.  No murmur heard. No BLE edema. Pulmonary/Chest: Effort normal and breath sounds normal. No respiratory distress. Abdominal: Soft.  There is no tenderness. Skin: resolving abscess of left axilla, mild pain and erythema not drainage, previous scar, no problems on right axilla  Psychiatric: Patient has a normal mood and affect. behavior is normal. Judgment and thought content normal.  Recent Results (from the past 2160 hour(s))   POCT HgB A1C     Status: Abnormal   Collection Time: 07/12/17  2:15 PM  Result Value Ref Range   Hemoglobin  A1C >14.0%      PHQ2/9: Depression screen [facility] 2/9 07/12/2017 06/28/2016 03/29/2016 11/03/2015 09/30/2015  Decreased Interest 3 0 0 0 0  Down, Depressed, Hopeless 3 0 0 0 0  PHQ - 2 Score 6 0 0 0 0  Altered sleeping 3 - - - -  Tired, decreased energy 3 - - - -  Change in appetite 0 - - - -  Feeling bad or failure about yourself  2 - - - -  Trouble concentrating 3 - - - -  Moving slowly or fidgety/restless 3 - - - -  Suicidal thoughts 0 - - - -  PHQ-9 Score 20 - - - -  Difficult doing work/chores Extremely dIfficult - - - -    Fall Risk: Fall Risk  07/12/2017 04/12/2017 03/06/2017 06/28/2016 03/29/2016  Falls in the past year? No No No Yes Yes  Comment - - - - -  Number falls in past yr: - - - 2 or more 1  Injury with Fall? - - - Yes No  Follow up - - - - Falls evaluation completed     Functional Status Survey: Is the patient deaf or have difficulty hearing?: Yes Does the patient have difficulty seeing, even when wearing glasses/contacts?: No Does the patient have difficulty concentrating, remembering, or making decisions?: Yes Does the patient have difficulty walking or climbing stairs?: Yes Does the patient have difficulty dressing or bathing?: No Does the patient have difficulty doing errands alone such as visiting a doctor's office or shopping?: Yes   Assessment & Plan  1. Diabetes mellitus with renal manifestations, uncontrolled ([place])  - POCT HgB A1C - needs to follow up with Endocrinologist.   2. Breast cancer screening  - MM Digital Screening  3. 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 Paroxysmal atrial fibrillation (HCC)  Seeing Dr. Rockey Situ , refuses to take COPD medication because it happened first after started on Spiriva  5. OSA (obstructive sleep apnea)  Not on CPAP , last study Dec 2018 did not qualify  for CPAP   6. Angina pectoris ([place])  Doing well at this time   7. Abscess of left axilla  - doxycycline (VIBRA-TABS) 100 MG tablet; Take 1 tablet (100 mg total) by mouth 2 (two) times daily.  Dispense: 20 tablet; Refill: 0  8. Migraine without aura and without status migrainosus, not intractable

## 2017-08-26 ENCOUNTER — Ambulatory Visit: Payer: Medicaid Other | Admitting: Cardiovascular Disease

## 2017-08-26 ENCOUNTER — Other Ambulatory Visit: Payer: Self-pay | Admitting: Family Medicine

## 2017-08-26 ENCOUNTER — Telehealth: Payer: Self-pay

## 2017-08-26 DIAGNOSIS — M5416 Radiculopathy, lumbar region: Secondary | ICD-10-CM

## 2017-08-26 NOTE — Telephone Encounter (Signed)
Patient needs the referral for her back pain.

## 2017-08-26 NOTE — Telephone Encounter (Signed)
For migraines or back pain?

## 2017-08-26 NOTE — Telephone Encounter (Signed)
Copied from [place] [id number]. Topic: Referral - Request >> Aug 23, 2017  2:44 PM Pricilla Handler wrote: Reason for CRM: Patient called requesting if Dr. Ancil Boozer would refer the patient to a pain clinic. Please call patient at [phone].       Thank You!!!

## 2017-09-05 ENCOUNTER — Other Ambulatory Visit: Payer: Self-pay | Admitting: Cardiovascular Disease

## 2017-09-13 ENCOUNTER — Telehealth: Payer: Self-pay | Admitting: Cardiovascular Disease

## 2017-09-13 MED ORDER — NEBIVOLOL HCL 10 MG PO TABS: 10.0000 mg | ORAL_TABLET | Freq: Every day | ORAL | 1 refills | Status: DC

## 2017-09-13 NOTE — Telephone Encounter (Signed)
°*  STAT* If patient is at the pharmacy, call can be transferred to refill team.   1. Which medications need to be refilled? (please list name of each medication and dose if known) bystolic 10 mg po q day   2. Which pharmacy/location (including street and city if local pharmacy) is medication to be sent to?[facility] [address]   3. Do they need a 30 day or 90 day supply? 90   PER PATIENT PA IS NEEDED FOR THIS MED

## 2017-09-13 NOTE — Telephone Encounter (Signed)
It appears Bystolic was removed from list by Dola at Dr Ancil Boozer office on 07/12/17 with reason "patient reported not taking." Patient says she has not stopped taking. Patient took last pill this morning. She has 1 year f/u on 10/17/17 with Ignacia Bayley, NP. Advised I will send in enough to get her to upcoming appointment and then she will need to keep appointment for further refills. She verbalized understanding. She also said it would need another prior authorization. Once Rx sent will await prior auth from pharmacy.  Routing to Dr Rockey Situ to review and make sure Bystolic refill is appropriate for patient.

## 2017-09-13 NOTE — Telephone Encounter (Signed)
Bystolic is not on patients medication list.  Please advise if okay to refill.

## 2017-09-13 NOTE — Telephone Encounter (Signed)
Looks like she take bystolic 10 daily Ok to refill

## 2017-10-08 ENCOUNTER — Ambulatory Visit
Payer: Medicaid Other | Attending: Student in an Organized Health Care Education/Training Program | Admitting: Student in an Organized Health Care Education/Training Program

## 2017-10-08 ENCOUNTER — Encounter: Payer: Self-pay | Admitting: Student in an Organized Health Care Education/Training Program

## 2017-10-08 ENCOUNTER — Other Ambulatory Visit: Payer: Self-pay

## 2017-10-08 VITALS — BP 123/70 | HR 86 | Temp 98.9°F | Resp 16 | Ht 61.0 in | Wt 200.0 lb

## 2017-10-08 DIAGNOSIS — R0602 Shortness of breath: Secondary | ICD-10-CM | POA: Diagnosis not present

## 2017-10-08 DIAGNOSIS — M7918 Myalgia, other site: Secondary | ICD-10-CM | POA: Diagnosis not present

## 2017-10-08 DIAGNOSIS — E559 Vitamin D deficiency, unspecified: Secondary | ICD-10-CM

## 2017-10-08 DIAGNOSIS — I10 Essential (primary) hypertension: Secondary | ICD-10-CM | POA: Diagnosis not present

## 2017-10-08 DIAGNOSIS — L304 Erythema intertrigo: Secondary | ICD-10-CM | POA: Diagnosis not present

## 2017-10-08 DIAGNOSIS — G8929 Other chronic pain: Secondary | ICD-10-CM

## 2017-10-08 DIAGNOSIS — E538 Deficiency of other specified B group vitamins: Secondary | ICD-10-CM | POA: Diagnosis not present

## 2017-10-08 DIAGNOSIS — I480 Paroxysmal atrial fibrillation: Secondary | ICD-10-CM | POA: Diagnosis not present

## 2017-10-08 DIAGNOSIS — J449 Chronic obstructive pulmonary disease, unspecified: Secondary | ICD-10-CM | POA: Diagnosis not present

## 2017-10-08 DIAGNOSIS — I25119 Atherosclerotic heart disease of native coronary artery with unspecified angina pectoris: Secondary | ICD-10-CM | POA: Diagnosis not present

## 2017-10-08 DIAGNOSIS — D249 Benign neoplasm of unspecified breast: Secondary | ICD-10-CM | POA: Insufficient documentation

## 2017-10-08 DIAGNOSIS — M5441 Lumbago with sciatica, right side: Secondary | ICD-10-CM | POA: Diagnosis not present

## 2017-10-08 DIAGNOSIS — R0902 Hypoxemia: Secondary | ICD-10-CM | POA: Diagnosis not present

## 2017-10-08 DIAGNOSIS — G894 Chronic pain syndrome: Secondary | ICD-10-CM | POA: Diagnosis not present

## 2017-10-08 DIAGNOSIS — M4800 Spinal stenosis, site unspecified: Secondary | ICD-10-CM | POA: Diagnosis not present

## 2017-10-08 DIAGNOSIS — M549 Dorsalgia, unspecified: Secondary | ICD-10-CM | POA: Diagnosis present

## 2017-10-08 DIAGNOSIS — G43909 Migraine, unspecified, not intractable, without status migrainosus: Secondary | ICD-10-CM | POA: Insufficient documentation

## 2017-10-08 DIAGNOSIS — M5442 Lumbago with sciatica, left side: Secondary | ICD-10-CM

## 2017-10-08 DIAGNOSIS — I252 Old myocardial infarction: Secondary | ICD-10-CM | POA: Diagnosis not present

## 2017-10-08 DIAGNOSIS — G629 Polyneuropathy, unspecified: Secondary | ICD-10-CM

## 2017-10-08 DIAGNOSIS — Z792 Long term (current) use of antibiotics: Secondary | ICD-10-CM | POA: Insufficient documentation

## 2017-10-08 DIAGNOSIS — G4733 Obstructive sleep apnea (adult) (pediatric): Secondary | ICD-10-CM | POA: Insufficient documentation

## 2017-10-08 DIAGNOSIS — H9313 Tinnitus, bilateral: Secondary | ICD-10-CM | POA: Diagnosis not present

## 2017-10-08 DIAGNOSIS — E785 Hyperlipidemia, unspecified: Secondary | ICD-10-CM | POA: Insufficient documentation

## 2017-10-08 DIAGNOSIS — E1165 Type 2 diabetes mellitus with hyperglycemia: Secondary | ICD-10-CM | POA: Diagnosis not present

## 2017-10-08 DIAGNOSIS — Z791 Long term (current) use of non-steroidal anti-inflammatories (NSAID): Secondary | ICD-10-CM | POA: Insufficient documentation

## 2017-10-08 DIAGNOSIS — G4700 Insomnia, unspecified: Secondary | ICD-10-CM | POA: Insufficient documentation

## 2017-10-08 DIAGNOSIS — D72829 Elevated white blood cell count, unspecified: Secondary | ICD-10-CM | POA: Diagnosis not present

## 2017-10-08 DIAGNOSIS — F17211 Nicotine dependence, cigarettes, in remission: Secondary | ICD-10-CM | POA: Insufficient documentation

## 2017-10-08 DIAGNOSIS — Z90710 Acquired absence of both cervix and uterus: Secondary | ICD-10-CM | POA: Insufficient documentation

## 2017-10-08 DIAGNOSIS — Z7902 Long term (current) use of antithrombotics/antiplatelets: Secondary | ICD-10-CM | POA: Insufficient documentation

## 2017-10-08 DIAGNOSIS — Z9889 Other specified postprocedural states: Secondary | ICD-10-CM | POA: Insufficient documentation

## 2017-10-08 DIAGNOSIS — Z7982 Long term (current) use of aspirin: Secondary | ICD-10-CM | POA: Insufficient documentation

## 2017-10-08 DIAGNOSIS — Z79899 Other long term (current) drug therapy: Secondary | ICD-10-CM | POA: Insufficient documentation

## 2017-10-08 DIAGNOSIS — N289 Disorder of kidney and ureter, unspecified: Secondary | ICD-10-CM | POA: Diagnosis not present

## 2017-10-08 DIAGNOSIS — E1129 Type 2 diabetes mellitus with other diabetic kidney complication: Secondary | ICD-10-CM | POA: Diagnosis not present

## 2017-10-08 MED ORDER — CHOLECALCIFEROL 125 MCG (5000 UT) PO CAPS: 5000.0000 [IU] | ORAL_CAPSULE | Freq: Every day | ORAL | 2 refills | Status: AC

## 2017-10-08 MED ORDER — DICLOFENAC SODIUM 75 MG PO TBEC: 75.0000 mg | DELAYED_RELEASE_TABLET | Freq: Two times a day (BID) | ORAL | 0 refills | Status: DC

## 2017-10-08 MED ORDER — TIZANIDINE HCL 4 MG PO TABS: 4.0000 mg | ORAL_TABLET | Freq: Two times a day (BID) | ORAL | 1 refills | Status: DC | PRN

## 2017-10-08 NOTE — Progress Notes (Signed)
Patient's Name: Barbara Bowen  MRN: [id number]  Referring Provider: Steele Sizer, MD  DOB: [birth year]  PCP: Steele Sizer, MD  DOS: 10/08/2017  Note by: Gillis Santa, MD  Service setting: Ambulatory outpatient  Specialty: Interventional Pain Management  Location: [facility] (AMB) Pain Management Facility  Visit type: Initial Patient Evaluation  Patient type: New Patient   Primary Reason(s) for Visit: Encounter for initial evaluation of one or more chronic problems (new to examiner) potentially causing chronic pain, and posing a threat to normal musculoskeletal function. (Level of risk: High) CC: Back Pain (right)  HPI  Barbara Bowen is a 62 y.o. year old, female patient, who comes today to see [place] for the first time for an initial evaluation of her chronic pain. 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 Today she comes in for evaluation of her Back Pain (right)  Pain Assessment: Location:   Back Radiating: radiates to right hip and down right leg to knee in the front Onset: More than a month ago Duration: Chronic pain Quality: Sharp, Spasm, Constant Severity: 2 /10 (subjective, self-reported pain  score)  Note: Reported level is compatible with observation.                         When using our objective Pain Scale, levels between 6 and 10/10 are said to belong in an emergency room, as it progressively worsens from a 6/10, described as severely limiting, requiring emergency care not usually available at an outpatient pain management facility. At a 6/10 level, communication becomes difficult and requires great effort. Assistance to reach the emergency department may be required. Facial flushing and profuse sweating along with potentially dangerous increases in heart rate and blood pressure will be evident. Effect on ADL: Has to lean on buggy at store, cant do housework , cant stand in shower Timing: Constant Modifying factors: denies BP: 123/70  HR: 86  Onset and Duration: Gradual Cause of pain: [place] 15 years ago. Severity: Getting worse, NAS-11 at its worse: 4/10, NAS-11 at its best: 1/10, NAS-11 now: 3/10 and NAS-11 on the average: 3/10 Timing: Not influenced by the time of the day Aggravating Factors: Bending, Climbing, Kneeling, Lifiting, Motion, Prolonged sitting, Prolonged standing, Twisting, Walking, Walking uphill and Walking downhill Alleviating Factors: Hot packs, Lying down and Medications Associated Problems: Fatigue, Spasms, Tingling, Weakness and Pain that does not allow patient to sleep Quality of Pain: Annoying, Disabling, Nagging, Pressure-like, Sharp, Throbbing and Uncomfortable Previous Examinations or Tests: X-rays and Orthopedic evaluation Previous Treatments: Physical Therapy  The patient comes into the clinics today for the first time for a chronic pain management evaluation.   62 year old female with a chief complaint of axial low back pain that radiates to the right hip and down her right leg to her knee in the front.  This started approximately 15 years ago when the patient fell on ice.  Is gotten  worse over time.  The patient does have significant cardiovascular  disease and has multiple coronary stents in place and is on Plavix along with a full strength aspirin.  Patient also has a history of TIAs.  Patient also has a history of obstructive sleep apnea and COPD.  She has difficulty performing physical activities given her shortness of breath and her pain in her axial low back.  Patient denies any bladder or bowel dysfunction.  Patient smokes approximately 2 packs of cigarettes per day.  In the past, patient is tried aspirin, ibuprofen, Tylenol, gabapentin.  She states that these medications were not effective and notes that gabapentin made her groggy and immobile.  She is currently not on any opioid therapy.  Today I took the time to provide the patient with information regarding my pain practice. The patient was informed that my practice is divided into two sections: an interventional pain management section, as well as a completely separate and distinct medication management section. I explained that I have procedure days for my interventional therapies, and evaluation days for follow-ups and medication management. Because of the amount of documentation required during both, they are kept separated. This means that there is the possibility that she may be scheduled for a procedure on one day, and medication management the next. I have also informed her that because of staffing and facility limitations, I no longer take patients for medication management only. To illustrate the reasons for this, I gave the patient the example of surgeons, and how inappropriate it would be to refer a patient to his/her care, just to write for the post-surgical antibiotics on a surgery done by a different surgeon.   Because interventional pain management is my board-certified specialty, the patient was informed that joining my practice means that they are open to any and all interventional therapies. I made it clear that this does not mean that they will be forced to have any procedures  done. What this means is that I believe interventional therapies to be essential part of the diagnosis and proper management of chronic pain conditions. Therefore, patients not interested in these interventional alternatives will be better served under the care of a different practitioner.  The patient was also made aware of my Comprehensive Pain Management Safety Guidelines where by joining my practice, they limit all of their nerve blocks and joint injections to those done by our practice, for as long as we are retained to manage their care.   Historic Controlled Substance Pharmacotherapy Review  Currently not on any opioid therapy.  Last fill was 04/12/2017, hydrocodone 10 mill grams, quantity 30.  This was provided to her for headaches. Medications: The patient did not bring the medication(s) to the appointment, as requested in our "New Patient Package" Pharmacodynamics: Desired effects: Analgesia: The patient reports <50% benefit. Reported improvement in function: The patient reports medication allows her to accomplish basic ADLs. Clinically meaningful improvement in function (CMIF): Sustained CMIF goals met Perceived effectiveness: Described as relatively effective, allowing for increase in activities of daily living (ADL) Undesirable effects: Side-effects or Adverse reactions: None reported Historical Monitoring: The patient  reports that she does not use drugs. List of all UDS Test(s): No results found for: MDMA, COCAINSCRNUR, [place], [place], CANNABQUANT, [place], [place] List of other Serum/Urine Drug Screening Test(s):  No results found for: AMPHSCRSER, BARBSCRSER, BENZOSCRSER, COCAINSCRSER, COCAINSCRNUR, PCPSCRSER, PCPQUANT, THCSCRSER, THCU, CANNABQUANT, OPIATESCRSER, OXYSCRSER, PROPOXSCRSER, ETH Historical Background Evaluation: [place] PMP: Six (6) year initial data search conducted.  [place] Department of public safety, offender search: Editor, commissioning Information) Non-contributory Risk  Assessment Profile: Aberrant behavior: None observed or detected today Risk factors for fatal opioid overdose: None identified today Fatal overdose hazard ratio (HR): Calculation deferred Non-fatal overdose hazard ratio (HR): Calculation deferred Risk of opioid abuse or dependence: 0.7-3.0% with doses ? 36 MME/day and 6.1-26% with doses ? 120 MME/day. Substance use disorder (SUD) risk level: Moderate Opioid risk tool (ORT) (Total Score): 0 Opioid Risk Tool - 10/08/17 1112      Family History of Substance Abuse   Alcohol  Negative    Illegal Drugs  Negative    Rx Drugs  Negative      Personal History of Substance Abuse   Alcohol  Negative    Illegal Drugs  Negative    Rx Drugs  Negative      Age   Age between 55-45 years   No      Psychological Disease   Psychological Disease  Negative    Depression  Negative      Total Score   Opioid Risk Tool Scoring  0    Opioid Risk Interpretation  Low Risk      ORT Scoring interpretation table:  Score <3 = Low Risk for SUD  Score between 4-7 = Moderate Risk for SUD  Score >8 = High Risk for Opioid Abuse   PHQ-2 Depression Scale:  Total score: 0  PHQ-2 Scoring interpretation table: (Score and probability of major depressive disorder)  Score 0 = No depression  Score 1 = 15.4% Probability  Score 2 = 21.1% Probability  Score 3 = 38.4% Probability  Score 4 = 45.5% Probability  Score 5 = 56.4% Probability  Score 6 = 78.6% Probability   PHQ-9 Depression Scale:  Total score: 0  PHQ-9 Scoring interpretation table:  Score 0-4 = No depression  Score 5-9 = Mild depression  Score 10-14 = Moderate depression  Score 15-19 = Moderately severe depression  Score 20-27 = Severe depression (2.4 times higher risk of SUD and 2.89 times higher risk of overuse)   Pharmacologic Plan: No opioid analgesics.            Initial impression: Poor candidate for opioid analgesics.  Meds   Current Outpatient Medications:  .  albuterol (PROVENTIL  HFA;VENTOLIN HFA) 108 (90 Base) MCG/ACT inhaler, TAKE 2 PUFFS BY MOUTH EVERY 6 HOURS AS NEEDED, Disp: 8.5 Inhaler, Rfl: 0 .  aspirin 81 MG EC tablet, Take 81 mg by mouth 4 (four) times daily. , Disp: , Rfl:  .  BD ULTRA-FINE LANCETS lancets, Use as instructed, Disp: 100 each, Rfl: 12 .  clopidogrel (PLAVIX) 75 MG tablet, TAKE 1 TABLET (75 MG TOTAL) BY MOUTH DAILY., Disp: 90 tablet, Rfl: 3 .  ezetimibe (ZETIA) 10 MG tablet, Take 1 tablet (10 mg total) by mouth daily., Disp: 90 tablet, Rfl: 3 .  glucose blood (ONE TOUCH ULTRA TEST) test strip, Use as instructed, Disp: 100 each, Rfl: 12 .  liraglutide (VICTOZA) 18 MG/3ML SOPN, Inject into the skin., Disp: , Rfl:  .  metFORMIN (GLUCOPHAGE-XR) 500 MG 24 hr tablet, Take 500 mg by mouth 2 (two) times daily., Disp: , Rfl: 6 .  nebivolol (BYSTOLIC) 10 MG tablet, Take 1 tablet (10 mg total) by mouth daily. Please keep appointment for further refills. Thanks!, Disp: 30 tablet, Rfl: 1 .  nitroGLYCERIN (NITRO-DUR) 0.2 mg/hr patch, Place 1 patch (0.2 mg total) onto the skin daily., Disp: 30 patch, Rfl: 6 .  OXYGEN,  Inhale 2.5 L into the lungs as needed., Disp: , Rfl:  .  rosuvastatin (CRESTOR) 40 MG tablet, Take 1 tablet (40 mg total) by mouth daily., Disp: 90 tablet, Rfl: 3 .  Cholecalciferol 5000 units capsule, Take 1 capsule (5,000 Units total) by mouth daily., Disp: 60 capsule, Rfl: 2 .  diclofenac (VOLTAREN) 75 MG EC tablet, Take 1 tablet (75 mg total) by mouth 2 (two) times daily after a meal., Disp: 60 tablet, Rfl: 0 .  doxycycline (VIBRA-TABS) 100 MG tablet, Take 1 tablet (100 mg total) by mouth 2 (two) times daily. (Patient not taking: Reported on 10/08/2017), Disp: 20 tablet, Rfl: 0 .  tiZANidine (ZANAFLEX) 4 MG tablet, Take 1 tablet (4 mg total) by mouth 2 (two) times daily as needed for muscle spasms., Disp: 60 tablet, Rfl: 1  Imaging Review   Hand Imaging: Hand-R DG Complete:  Results for orders placed during the hospital encounter of 06/23/17   DG Hand Complete Right   Narrative CLINICAL DATA:  Felt a pop in the right hand. Bruising of the right palm.  EXAM: RIGHT HAND - COMPLETE 3+ VIEW  COMPARISON:  None.  FINDINGS: There is no evidence of fracture or dislocation. There is no evidence of arthropathy or other focal bone abnormality. Soft tissues are unremarkable.  IMPRESSION: No acute osseous injury of the right hand.   Electronically Signed   By: Kathreen Devoid   On: 06/23/2017 10:44    Hand-L DG Complete:  Results for orders placed during the hospital encounter of 02/09/17  DG Hand Complete Left   Narrative CLINICAL DATA:  Fall 2 days ago with persistent hand pain, initial encounter  EXAM: LEFT HAND - COMPLETE 3+ VIEW  COMPARISON:  None.  FINDINGS: There is no evidence of fracture or dislocation. There is no evidence of arthropathy or other focal bone abnormality. Soft tissues are unremarkable.  IMPRESSION: No acute abnormality noted.   Electronically Signed   By: Inez Catalina M.D.   On: 02/09/2017 20:52     Complexity Note: Imaging results reviewed. Results shared with Ms. Grygiel, using Layman's terms.                         ROS  Cardiovascular: Heart trouble, Abnormal heart rhythm, Daily Aspirin intake and Blood thinners:  Antiplatelet Pulmonary or Respiratory: Lung problems, Wheezing and difficulty taking a deep full breath (Asthma), Difficulty blowing air out (Emphysema) and Coughing up mucus (Bronchitis) Neurological: Stroke (Residual deficits or weakness: multiple TIA) Review of Past Neurological Studies:  Results for orders placed or performed during the hospital encounter of 03/19/11  MR Brain Wo Contrast   Narrative   *RADIOLOGY REPORT*  Clinical Data: Disturbance of salivary secretion  MRI HEAD WITHOUT CONTRAST  Technique:  Multiplanar, multiecho pulse sequences of the brain and surrounding structures were obtained according to standard protocol without intravenous  contrast.  Comparison: None.  Findings: Ventricle size is normal.  Negative for acute or chronic infarct.  Negative for demyelinating disease.  Negative for hemorrhage or mass.  No fluid collection is identified.  Pituitary is not enlarged.  Visualized parotid gland is normal bilaterally.  Paranasal sinuses are clear bilaterally.  IMPRESSION: Normal study.  Original Report Authenticated By: Truett Perna, M.D.   Psychological-Psychiatric: No reported psychological or psychiatric signs or symptoms such as difficulty sleeping, anxiety, depression, delusions or hallucinations (schizophrenial), mood swings (bipolar disorders) or suicidal ideations or attempts Gastrointestinal: Heartburn due to stomach pushing into lungs (  Hiatal hernia) Genitourinary: No reported renal or genitourinary signs or symptoms such as difficulty voiding or producing urine, peeing blood, non-functioning kidney, kidney stones, difficulty emptying the bladder, difficulty controlling the flow of urine, or chronic kidney disease Hematological: No reported hematological signs or symptoms such as prolonged bleeding, low or poor functioning platelets, bruising or bleeding easily, hereditary bleeding problems, low energy levels due to low hemoglobin or being anemic Endocrine: High blood sugar requiring insulin (IDDM) Rheumatologic: No reported rheumatological signs and symptoms such as fatigue, joint pain, tenderness, swelling, redness, heat, stiffness, decreased range of motion, with or without associated rash Musculoskeletal: Negative for myasthenia gravis, muscular dystrophy, multiple sclerosis or malignant hyperthermia Work History: Disabled  Allergies  Ms. Gorder is allergic to amoxicillin-pot clavulanate; cefprozil; esomeprazole magnesium; montelukast sodium; omeprazole; penicillins; prednisone; spiriva [tiotropium bromide monohydrate]; and fluticasone-salmeterol.  Laboratory Chemistry  Inflammation Markers (CRP:  Acute Phase) (ESR: Chronic Phase) Lab Results  Component Value Date   ESRSEDRATE 18 12/24/2014                         Rheumatology Markers No results found for: RF, ANA, LABURIC, URICUR, LYMEIGGIGMAB, LYMEABIGMQN, HLAB27                      Renal Function Markers Lab Results  Component Value Date   BUN 9 02/22/2017   CREATININE 0.70 62/83/1517   BCR NOT APPLICABLE 61/60/7371   GFRAA >60 02/22/2017   GFRNONAA >60 02/22/2017                             Hepatic Function Markers Lab Results  Component Value Date   AST 19 01/11/2017   ALT 23 01/11/2017   ALBUMIN 4.3 08/23/2015   ALKPHOS 120 (H) 08/23/2015                        Electrolytes Lab Results  Component Value Date   NA 133 (L) 02/22/2017   K 3.7 02/22/2017   CL 96 (L) 02/22/2017   CALCIUM 9.5 02/22/2017   MG 2.1 10/29/2013                        Neuropathy Markers Lab Results  Component Value Date   VITAMINB12 476 01/11/2017   FOLATE 11.5 12/24/2014   HGBA1C >14.0% 07/12/2017   HIV Non Reactive 03/11/2015                        Bone Pathology Markers Lab Results  Component Value Date   VD25OH 10 (L) 01/11/2017                         Coagulation Parameters Lab Results  Component Value Date   INR 0.9 12/14/2014   LABPROT 9.8 12/14/2014   APTT 42.2 (H) 10/28/2013   PLT 221 02/22/2017                        Cardiovascular Markers Lab Results  Component Value Date   CKTOTAL 81 10/29/2013   CKMB 2.7 10/29/2013   TROPONINI <0.03 02/22/2017   HGB 16.6 (H) 02/22/2017   HCT 48.1 (H) 02/22/2017  CA Markers No results found for: CEA, CA125, LABCA2                      Note: Lab results reviewed.  PFSH  Drug: Ms. Ackroyd  reports that she does not use drugs. Alcohol:  reports that she does not drink alcohol. Tobacco:  reports that she has been smoking cigarettes.  She started smoking about 39 years ago. She has a 74.00 pack-year smoking history. She has never used  smokeless tobacco. Medical:  has a past medical history of Anxiety, CHF (congestive heart failure) ([place]), COPD (chronic obstructive pulmonary disease) ([place]), Coronary artery disease, DM type 2 (diabetes mellitus, type 2) ([place]), Enthesopathy of hip region, Essential hypertension, Headache, TIA (transient ischemic attack), Hyperlipidemia, Insomnia, unspecified, Leukocytosis, unspecified, Lipoma of other skin and subcutaneous tissue, OA (osteoarthritis), Occlusion and stenosis of carotid artery without mention of cerebral infarction, Palpitations, Paroxysmal atrial fibrillation (HCC), Plantar fascial fibromatosis, Proteinuria, Stroke ([place]), Tobacco use disorder, Unspecified sleep apnea, and Unspecified tinnitus. Family: family history includes Breast cancer in her maternal aunt; Coronary artery disease in her mother; Diabetes type II in her mother; Heart disease in her father.  Past Surgical History:  Procedure Laterality Date  . ABDOMINAL HYSTERECTOMY    . APPENDECTOMY    . BREAST SURGERY Right 06-09-2012   biopsy x2 9 oclock and 11 oclock  . CARDIAC CATHETERIZATION  2009   s/p right coronary artery drug-eluting stent  . CARDIAC CATHETERIZATION N/A 12/16/2014   Procedure: Left Heart Cath and Coronary Angiography;  Surgeon: Minna Merritts, MD;  Location: [facility];  Service: Cardiovascular;  Laterality: N/A;  . CORONARY ANGIOPLASTY  2015  . exploratory thoracotomy    . MOLE REMOVAL     Active Ambulatory Problems    Diagnosis Date Noted  . Hyperlipidemia 10/28/2009  . CAD (coronary artery disease), native coronary artery 10/28/2009  . Shortness of breath 10/28/2009  . Angina pectoris ([place]) 10/28/2009  . Smoking 03/27/2011  . COPD (chronic obstructive pulmonary disease) ([place]) 09/09/2012  . Benign neoplasm of breast 09/12/2012  . Essential hypertension   . Paroxysmal atrial fibrillation (H[facility])   . Diabetes mellitus with renal manifestations, uncontrolled ([place])   . Leukocytosis  12/24/2014  . Macrocytic 12/24/2014  . History of MI (myocardial infarction) 10/30/2013  . Insomnia 12/29/2014  . Migraine without aura and without status migrainosus, not intractable 12/29/2014  . Intertrigo 02/23/2015  . B12 deficiency 03/31/2015  . OSA (obstructive sleep apnea) 03/31/2015  . 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 Bilateral tinnitus 08/23/2015  . Spinal stenosis 10/15/2015  . Dyslipidemia associated with type 2 diabetes mellitus ([place]) 06/28/2016  . Dependence on nocturnal oxygen therapy 04/12/2017  . Vitamin D deficiency, unspecified 05/07/2017  . Hyperlipidemia due to type 2 diabetes mellitus ([place]) 05/07/2017   Resolved Ambulatory Problems    Diagnosis Date Noted  . Palpitations 10/28/2009  . Lump or mass in breast 09/12/2012  . Well woman exam 02/23/2015   Past Medical History:  Diagnosis Date  . Anxiety   . CHF (congestive heart failure) ([place])   . COPD (chronic obstructive pulmonary disease) ([place])   . Coronary artery disease   . DM type 2 (diabetes mellitus, type 2) ([place])   . Enthesopathy of hip region   . Essential hypertension   . Headache   . Hx-TIA (transient ischemic attack)   . Hyperlipidemia   . Insomnia, unspecified   . Leukocytosis, unspecified   .  Lipoma of other skin and subcutaneous tissue   . OA (osteoarthritis)   . Occlusion and stenosis of carotid artery without mention of cerebral infarction   . Palpitations   . Paroxysmal atrial fibrillation (HCC)   . Plantar fascial fibromatosis   . Proteinuria   . Stroke ([place])   . Tobacco use disorder   . Unspecified sleep apnea   . Unspecified tinnitus    Constitutional Exam  General appearance: Well nourished, well developed, and well hydrated. In no apparent acute distress Vitals:   10/08/17 1102  BP: 123/70  Pulse: 86  Resp: 16  Temp: 98.9 F (37.2 C)  SpO2: 97%  Weight: 200 lb (90.7 kg)  Height: '5\' 1"'$  (1.549 m)   BMI Assessment: Estimated  body mass index is 37.79 kg/m as calculated from the following:   Height as of this encounter: '5\' 1"'$  (1.549 m).   Weight as of this encounter: 200 lb (90.7 kg).  BMI interpretation table: BMI level Category Range association with higher incidence of chronic pain  <18 kg/m2 Underweight   18.5-24.9 kg/m2 Ideal body weight   25-29.9 kg/m2 Overweight Increased incidence by 20%  30-34.9 kg/m2 Obese (Class I) Increased incidence by 68%  35-39.9 kg/m2 Severe obesity (Class II) Increased incidence by 136%  >40 kg/m2 Extreme obesity (Class III) Increased incidence by 254%   Patient's current BMI Ideal Body weight  Body mass index is 37.79 kg/m. Ideal body weight: 47.8 kg (105 lb 6.1 oz) Adjusted ideal body weight: 65 kg (143 lb 3.6 oz)   BMI Readings from Last 4 Encounters:  10/08/17 37.79 kg/m  07/12/17 35.14 kg/m  06/23/17 38.42 kg/m  04/12/17 35.92 kg/m   Wt Readings from Last 4 Encounters:  10/08/17 200 lb (90.7 kg)  07/12/17 186 lb (84.4 kg)  06/23/17 200 lb (90.7 kg)  04/12/17 190 lb 1.6 oz (86.2 kg)  Psych/Mental status: Alert, oriented x 3 (person, place, & time)       Eyes: PERLA Respiratory: No evidence of acute respiratory distress  Cervical Spine Area Exam  Skin & Axial Inspection: No masses, redness, edema, swelling, or associated skin lesions Alignment: Symmetrical Functional ROM: Unrestricted ROM      Stability: No instability detected Muscle Tone/Strength: Functionally intact. No obvious neuro-muscular anomalies detected. Sensory (Neurological): Unimpaired Palpation: No palpable anomalies              Upper Extremity (UE) Exam    Side: Right upper extremity  Side: Left upper extremity  Skin & Extremity Inspection: Skin color, temperature, and hair growth are WNL. No peripheral edema or cyanosis. No masses, redness, swelling, asymmetry, or associated skin lesions. No contractures.  Skin & Extremity Inspection: Skin color, temperature, and hair growth are WNL. No  peripheral edema or cyanosis. No masses, redness, swelling, asymmetry, or associated skin lesions. No contractures.  Functional ROM: Unrestricted ROM          Functional ROM: Unrestricted ROM          Muscle Tone/Strength: Functionally intact. No obvious neuro-muscular anomalies detected.  Muscle Tone/Strength: Functionally intact. No obvious neuro-muscular anomalies detected.  Sensory (Neurological): Unimpaired          Sensory (Neurological): Unimpaired          Palpation: No palpable anomalies              Palpation: No palpable anomalies              Provocative Test(s):  Phalen's test: deferred Tinel's test: deferred  Apley's scratch test (touch opposite shoulder):  Action 1 (Across chest): deferred Action 2 (Overhead): deferred Action 3 (LB reach): deferred   Provocative Test(s):  Phalen's test: deferred Tinel's test: deferred Apley's scratch test (touch opposite shoulder):  Action 1 (Across chest): deferred Action 2 (Overhead): deferred Action 3 (LB reach): deferred    Thoracic Spine Area Exam  Skin & Axial Inspection: No masses, redness, or swelling Alignment: Symmetrical Functional ROM: Unrestricted ROM Stability: No instability detected Muscle Tone/Strength: Functionally intact. No obvious neuro-muscular anomalies detected. Sensory (Neurological): Unimpaired Muscle strength & Tone: No palpable anomalies  Lumbar Spine Area Exam  Skin & Axial Inspection: No masses, redness, or swelling Alignment: Symmetrical Functional ROM: Decreased ROM       Stability: No instability detected Muscle Tone/Strength: Functionally intact. No obvious neuro-muscular anomalies detected. Sensory (Neurological): Musculoskeletal pain pattern Palpation: Complains of area being tender to palpation       Provocative Tests: Lumbar Hyperextension/rotation test: (+) bilaterally for facet joint pain. Lumbar quadrant test (Kemp's test): deferred today       Lumbar Lateral bending test: deferred today        Patrick's Maneuver: deferred today                   FABER test: (+) for bilateral S-I arthralgia Thigh-thrust test: deferred today       S-I compression test: deferred today       S-I distraction test: deferred today        Gait & Posture Assessment  Ambulation: Unassisted Gait: Relatively normal for age and body habitus Posture: WNL   Lower Extremity Exam    Side: Right lower extremity  Side: Left lower extremity  Stability: No instability observed          Stability: No instability observed          Skin & Extremity Inspection: Skin color, temperature, and hair growth are WNL. No peripheral edema or cyanosis. No masses, redness, swelling, asymmetry, or associated skin lesions. No contractures.  Skin & Extremity Inspection: Skin color, temperature, and hair growth are WNL. No peripheral edema or cyanosis. No masses, redness, swelling, asymmetry, or associated skin lesions. No contractures.  Functional ROM: Unrestricted ROM                  Functional ROM: Unrestricted ROM                  Muscle Tone/Strength: Functionally intact. No obvious neuro-muscular anomalies detected.  Muscle Tone/Strength: Functionally intact. No obvious neuro-muscular anomalies detected.  Sensory (Neurological): Unimpaired  Sensory (Neurological): Unimpaired  Palpation: No palpable anomalies  Palpation: No palpable anomalies   Assessment  Primary Diagnosis & Pertinent Problem List: The primary encounter diagnosis was Chronic pain syndrome. Diagnoses of Chronic bilateral low back pain with bilateral sciatica, Neuropathy, Myofascial pain dysfunction syndrome, and Vitamin D deficiency were also pertinent to this visit.  Visit Diagnosis (New problems to examiner): 1. Chronic pain syndrome   2. Chronic bilateral low back pain with bilateral sciatica   3. Neuropathy   4. Myofascial pain dysfunction syndrome   5. Vitamin D deficiency   General Recommendations: The pain condition that the patient suffers  from is best treated with a multidisciplinary approach that involves an increase in physical activity to prevent de-conditioning and worsening of the pain cycle, as well as psychological counseling (formal and/or informal) to address the co-morbid psychological affects of pain. Treatment will often involve judicious use of pain medications and interventional  procedures to decrease the pain, allowing the patient to participate in the physical activity that will ultimately produce long-lasting pain reductions. The goal of the multidisciplinary approach is to return the patient to a higher level of overall function and to restore their ability to perform activities of daily living.  62 year old female with a history of obesity, tobacco abuse, significant cardiovascular disease with multiple coronary stents on Plavix and full-strength aspirin, history of TIAs, who presents with chronic pain of her axial low back and right hip.  She states that this pain started after she fell on ice approximately 15 years ago.  She denies any bowel or bladder discomfort.  Patient is a type II diabetic with poorly controlled diabetes with an A1c of 12.5.  In regards to motor strength, she has full strength in her lower extremities.  Patellar reflex is 2+.  Patient does have vitamin D deficiency as noted on most recent lab work.  Patient also describes spasms of her bilateral legs when she has been on her feet for an extended period of time.  In regards to therapeutic options, I do not think that the patient will be a candidate for interventional therapies given that she has high risk to be off of her antiplatelet drugs ( aspirin and Plavix) in the context of multiple coronary stents, multiple TIAs.  I did counsel the patient on smoking cessation and explained how smoking could be contributing to her chronic pain as it makes it more difficult for her to exercise and walk without getting short of breath.  I informed the patient that we  would focus on physical therapy, psychotherapy, non-opioid-based pharmacotherapy.  Plan: -Diclofenac 75 mg twice daily for arthritic/muscular skeletal pain -Tizanidine 4 mg twice daily as needed for leg spasms -Vitamin D supplementation for low vitamin D level -We will focus on non-opioid-based pharmacologic management, physical therapy, psychotherapy -On Plavix and aspirin for coronary stents and multiple TIAs with poorly controlled diabetes.  Would recommend against any interventional therapies that would require the patient to be off of her antiplatelet drugs or would deliver steroids which could worsen the patient's serum blood sugars.  Note: Please be advised that as per protocol, today's visit has been an evaluation only. We have not taken over the patient's controlled substance management.  Ordered Lab-work, Procedure(s), Referral(s), & Consult(s): Orders Placed This Encounter  Procedures  . Compliance Drug Analysis, Ur   Pharmacotherapy (current): Medications ordered:  Meds ordered this encounter  Medications  . Cholecalciferol 5000 units capsule    Sig: Take 1 capsule (5,000 Units total) by mouth daily.    Dispense:  60 capsule    Refill:  2    Do not place medication on "Automatic Refill". Fill one day early if pharmacy is closed on scheduled refill date.  . diclofenac (VOLTAREN) 75 MG EC tablet    Sig: Take 1 tablet (75 mg total) by mouth 2 (two) times daily after a meal.    Dispense:  60 tablet    Refill:  0  . tiZANidine (ZANAFLEX) 4 MG tablet    Sig: Take 1 tablet (4 mg total) by mouth 2 (two) times daily as needed for muscle spasms.    Dispense:  60 tablet    Refill:  1   Medications administered during this visit: Christean Grief. [place] had no medications administered during this visit.   Pharmacological management options:  Opioid Analgesics: The patient was informed that there is no guarantee that she would be a candidate for  opioid analgesics. The decision will be  made following CDC guidelines. This decision will be based on the results of diagnostic studies, as well as Ms. Foody's risk profile.   Membrane stabilizer: Did not like how she felt with gabapentin.  Can consider Cymbalta, Lyrica, TCA  Muscle relaxant: To be determined at a later time  NSAID: To be determined at a later time  Other analgesic(s): To be determined at a later time   Interventional management options: Ms. Heumann was informed that there is no guarantee that she would be a candidate for interventional therapies. The decision will be based on the results of diagnostic studies, as well as Ms. Eplin's risk profile.  Procedure(s) under consideration:  Too high risk to be off of her antiplatelet medications   Provider-requested follow-up: Return in about 8 weeks (around 12/03/2017) for Medication Management.  Future Appointments  Date Time Provider [place]  10/17/2017  3:00 PM Theora Gianotti, NP [id number]  11/11/2017  1:40 PM Steele Sizer, MD [facility]  12/03/2017  9:30 AM Gillis Santa, MD [facility] None  01/13/2018  1:20 PM Steele Sizer, MD [facility]    Primary Care Physician: Steele Sizer, MD Location: [facility] Note by: Gillis Santa, M.D, Date: 10/08/2017; Time: 12:58 PM  There are no Patient Instructions on file for this visit.

## 2017-10-08 NOTE — Progress Notes (Signed)
Safety precautions to be maintained throughout the outpatient stay will include: orient to surroundings, keep bed in low position, maintain call bell within reach at all times, provide assistance with transfer out of bed and ambulation.  

## 2017-10-14 LAB — COMPLIANCE DRUG ANALYSIS, UR

## 2017-10-17 ENCOUNTER — Ambulatory Visit: Payer: Medicaid Other | Admitting: Nurse Practitioner

## 2017-10-17 ENCOUNTER — Encounter: Payer: Self-pay | Admitting: Nurse Practitioner

## 2017-10-17 VITALS — BP 120/76 | HR 69 | Ht 60.5 in | Wt 189.2 lb

## 2017-10-17 DIAGNOSIS — E785 Hyperlipidemia, unspecified: Secondary | ICD-10-CM | POA: Diagnosis not present

## 2017-10-17 DIAGNOSIS — I2510 Atherosclerotic heart disease of native coronary artery without angina pectoris: Secondary | ICD-10-CM

## 2017-10-17 DIAGNOSIS — I10 Essential (primary) hypertension: Secondary | ICD-10-CM

## 2017-10-17 MED ORDER — EZETIMIBE 10 MG PO TABS: 10.0000 mg | ORAL_TABLET | Freq: Every day | ORAL | 3 refills | Status: DC

## 2017-10-17 MED ORDER — ASPIRIN EC 81 MG PO TBEC: 81.0000 mg | DELAYED_RELEASE_TABLET | Freq: Every day | ORAL | 3 refills | Status: AC

## 2017-10-17 MED ORDER — ROSUVASTATIN CALCIUM 40 MG PO TABS: 40.0000 mg | ORAL_TABLET | Freq: Every day | ORAL | 3 refills | Status: DC

## 2017-10-17 MED ORDER — NITROGLYCERIN 0.2 MG/HR TD PT24: 0.2000 mg | MEDICATED_PATCH | Freq: Every day | TRANSDERMAL | 6 refills | Status: DC

## 2017-10-17 MED ORDER — CLOPIDOGREL BISULFATE 75 MG PO TABS
ORAL_TABLET | ORAL | 3 refills | Status: DC
Start: 2017-10-17 — End: 2018-11-05

## 2017-10-17 MED ORDER — NEBIVOLOL HCL 10 MG PO TABS: 10.0000 mg | ORAL_TABLET | Freq: Every day | ORAL | 6 refills | Status: DC

## 2017-10-17 MED ORDER — NITROGLYCERIN 0.4 MG SL SUBL: 0.4000 mg | SUBLINGUAL_TABLET | SUBLINGUAL | 3 refills | Status: DC | PRN

## 2017-10-17 NOTE — Progress Notes (Signed)
Office Visit    Patient Name: Barbara Bowen Date of Encounter: 10/17/2017  Primary Care Provider:  Steele Sizer, MD Primary Cardiologist:  Ida Rogue, MD  Chief Complaint    62 year old female with a history of CAD status post prior RCA and LAD stenting, hypertension, hyperlipidemia, poorly controlled diabetes, ongoing tobacco abuse, prior stroke/TIAs, paroxysmal atrial fibrillation, and COPD/asthma, who presents for follow-up.  Past Medical History    Past Medical History:  Diagnosis Date  . Anxiety   . CHF (congestive heart failure) ([place])   . COPD (chronic obstructive pulmonary disease) ([place])   . Coronary artery disease    a. 09/2007 s/p PCI/DES to prox RCA;  b. 2012 Cath: nonobs dzs;  c. 01/2014 PCI: 95d (3.0x23 Xience [place]), RPDA 80 (PTCA), EF 60%;  d. 11/2014 Cath: LM anomalous, LAD/D1/D2 min irregs, LCX small, min irregs, OM2 small, RCA 20p ISR, 10d ISR, EF 65%.  . DM type 2 (diabetes mellitus, type 2) ([place])   . Enthesopathy of hip region   . Essential hypertension   . Headache   . Hx-TIA (transient ischemic attack)   . Hyperlipidemia   . Insomnia, unspecified   . Leukocytosis, unspecified   . Lipoma of other skin and subcutaneous tissue   . OA (osteoarthritis)   . Occlusion and stenosis of carotid artery without mention of cerebral infarction   . Palpitations   . Paroxysmal atrial fibrillation ([facility])    a. not appreciated on holter 09/2013;  b. CHA2DS2VASc = 5 (htn, DM, h/o TIA, vasc dzs, female) - not on [place].  Marland Kitchen Plantar fascial fibromatosis   . Proteinuria   . Stroke ([place])   . Tobacco use disorder    a. does not believe cigarettes contribute to her dyspnea/asthma.    Marland Kitchen Unspecified sleep apnea    a. Wears O2 via [place] @ HS.  Marland Kitchen Unspecified tinnitus    Past Surgical History:  Procedure Laterality Date  . ABDOMINAL HYSTERECTOMY    . APPENDECTOMY    . BREAST SURGERY Right 06-09-2012   biopsy x2 9 oclock and 11 oclock  . CARDIAC CATHETERIZATION  2009   s/p  right coronary artery drug-eluting stent  . CARDIAC CATHETERIZATION N/A 12/16/2014   Procedure: Left Heart Cath and Coronary Angiography;  Surgeon: Minna Merritts, MD;  Location: [facility];  Service: Cardiovascular;  Laterality: N/A;  . CORONARY ANGIOPLASTY  2015  . exploratory thoracotomy    . MOLE REMOVAL      Allergies  Allergies  Allergen Reactions  . Amoxicillin-Pot Clavulanate     Vomiting   . Cefprozil     Vomiting   . Esomeprazole Magnesium     unknown  . Montelukast Sodium     unknown  . Omeprazole     unknown  . Penicillins     Vomiting blood  . Prednisone     Rapid heart beat & difficulty breathing  . Spiriva [Tiotropium Bromide Monohydrate]     HA  . Fluticasone-Salmeterol Palpitations    Also happened with Symbicort Also happened with Symbicort    History of Present Illness    62 year old female with the above complex past medical history including coronary artery disease status post drug-eluting stent placement to the RCA in 2009 followed by drug-eluting stent placement to the LAD in October 2015.  Last catheterization performed in August 2016 showed patent stents with nonobstructive disease and normal LV function.  Other history includes hypertension, hyperlipidemia, poorly controlled diabetes, obesity, paroxysmal  atrial fibrillation, ongoing tobacco abuse, COPD, and asthma.  She was last seen a year ago.  She reports chronic dyspnea on exertion in the setting of COPD and ongoing tobacco abuse, up to 2 packs/day.  She stays in her home most of her day and rarely gets outside.  She does not experience chest pain and denies PND, orthopnea, dizziness, syncope, edema, or early satiety.  She has lab work earlier this year  hemoglobin A1c of 12.5 in April.  Lipids at that time revealed a total cholesterol of 207, triglycerides 260, HDL 32, LDL 123.  She is on rosuvastatin and is supposed to be taking Zetia but now reports that she is not doing that.  Home  Medications    Prior to Admission medications   Medication Sig Start Date End Date Taking? Authorizing Provider  albuterol (PROVENTIL HFA;VENTOLIN HFA) 108 (90 Base) MCG/ACT inhaler TAKE 2 PUFFS BY MOUTH EVERY 6 HOURS AS NEEDED 06/05/17  Yes Sowles, Drue Stager, MD  BD ULTRA-FINE LANCETS lancets Use as instructed 01/14/17  Yes Steele Sizer, MD  Cholecalciferol 5000 units capsule Take 1 capsule (5,000 Units total) by mouth daily. 10/08/17 04/06/18 Yes Gillis Santa, MD  diclofenac (VOLTAREN) 75 MG EC tablet Take 1 tablet (75 mg total) by mouth 2 (two) times daily after a meal. 10/08/17  Yes Gillis Santa, MD  ezetimibe (ZETIA) 10 MG tablet Take 1 tablet (10 mg total) by mouth daily. 10/17/17  Yes Theora Gianotti, NP  glucose blood (ONE TOUCH ULTRA TEST) test strip Use as instructed 06/28/16  Yes Sowles, Drue Stager, MD  liraglutide (VICTOZA) 18 MG/3ML SOPN Inject into the skin. 07/24/17 07/24/18 Yes [provider]  metFORMIN (GLUCOPHAGE-XR) 500 MG 24 hr tablet Take 500 mg by mouth 2 (two) times daily. 06/06/17  Yes [provider]  nebivolol (BYSTOLIC) 10 MG tablet Take 1 tablet (10 mg total) by mouth daily. 10/17/17  Yes Theora Gianotti, NP  nitroGLYCERIN (NITRO-DUR) 0.2 mg/hr patch Place 1 patch (0.2 mg total) onto the skin daily. 10/17/17  Yes Theora Gianotti, NP  OXYGEN Inhale 2.5 L into the lungs as needed.   Yes [provider]  rosuvastatin (CRESTOR) 40 MG tablet Take 1 tablet (40 mg total) by mouth daily. 10/17/17  Yes Theora Gianotti, NP  tiZANidine (ZANAFLEX) 4 MG tablet Take 1 tablet (4 mg total) by mouth 2 (two) times daily as needed for muscle spasms. 10/08/17 10/08/18 Yes Gillis Santa, MD  aspirin EC 81 MG tablet Take 1 tablet (81 mg total) by mouth daily. 10/17/17   Theora Gianotti, NP  clopidogrel (PLAVIX) 75 MG tablet TAKE 1 TABLET (75 MG TOTAL) BY MOUTH DAILY. 10/17/17   Theora Gianotti, NP  nitroGLYCERIN (NITROSTAT)  0.4 MG SL tablet Place 1 tablet (0.4 mg total) under the tongue every 5 (five) minutes as needed. 10/17/17   Theora Gianotti, NP    Review of Systems    Chronic, stable dyspnea on exertion.  She denies chest pain, palpitations, PND, orthopnea, dizziness, syncope, edema, or early satiety.  All other systems reviewed and are otherwise negative except as noted above.  Physical Exam    VS:  BP 120/76 (BP Location: Left Arm, Patient Position: Sitting, Cuff Size: Normal)   Pulse 69   Ht 5' 0.5" (1.537 m)   Wt 189 lb 4 oz (85.8 kg)   BMI 36.35 kg/m  , BMI Body mass index is 36.35 kg/m. GEN: Well nourished, well developed, in no acute distress.  HEENT: normal.  Neck: Supple, no JVD, carotid bruits, or masses. Cardiac: RRR, no murmurs, rubs, or gallops. No clubbing, cyanosis, edema.  Radials/DP/PT 2+ and equal bilaterally.  Respiratory:  Respirations regular and unlabored, clear to auscultation bilaterally. GI: Soft, nontender, nondistended, BS + x 4. MS: no deformity or atrophy. Skin: warm and dry, no rash. Neuro:  Strength and sensation are intact. Psych: Normal affect.  Accessory Clinical Findings    ECG - regular sinus rhythm, 69, left axis deviation, left anterior fascicular block, prior septal infarct, no acute ST or T changes.  Assessment & Plan    1.  Coronary artery disease: Status post prior RCA and LAD stenting with patent stents on catheterization in 2016.  She has been doing relatively well without chest pain.  She is chronic, stable dyspnea on exertion in the setting of ongoing heavy tobacco abuse.  She has been taking 4 baby aspirin a day and have asked her to reduce this to just one baby aspirin daily.  She is also on Plavix, beta-blocker, nitro glycerin patch, and statin therapy.  I am refilling all of those today in addition to Plavix, which she is on chronically in the setting of prior TIAs and strokes.  2.  Essential hypertension: Stable.  3.  Hyperlipidemia:  Recent LDL of 123 on rosuvastatin only.  She was told a year ago to take Zetia but reports that she has not been taking this.  We will represcribed.  Follow-up lipids and LFTs in about 8 weeks.  4.  Ongoing tobacco abuse: Smoking 1 cigarette every hour and often as much as 2 packs a day as she says she is not uncommon to stay after 20 hours in a day.  She is not interested in quitting at all.   5.  Type 2 diabetes mellitus: Poorly controlled.  A1c was 12.5 in April.  This is followed by endocrinology.  6.  Prior history of stroke/TIA: On chronic Plavix.  She ran out of this month ago.  I will refill.  7.  Disposition: Follow-up lipids and LFTs in 8 weeks.  Follow-up in 1 year or sooner if necessary.   Murray Hodgkins, NP 10/17/2017, 4:57 PM

## 2017-10-17 NOTE — Patient Instructions (Signed)
Medication Instructions:  Your physician has recommended you make the following change in your medication:  1. DECREASE Aspirin down to 81 mg tablet once daily   Labwork: Labs to be done around Augsut 27th. Please take lab slips with you and check in at the [place] of the hospital. Make sure not to eat or drink anything after midnight prior and only small sip of water with pills.  Follow-Up: Your physician wants you to follow-up in: 1 year with Dr. Cathie Hoops will receive a reminder letter in the mail two months in advance. If you don't receive a letter, please call our office to schedule the follow-up appointment.  It was a pleasure seeing you today here in the office. Please do not hesitate to give [place] a call back if you have any further questions. [place], BSN     Any Other Special Instructions Will Be Listed Below (If Applicable).     If you need a refill on your cardiac medications before your next appointment, please call your pharmacy.

## 2017-11-11 ENCOUNTER — Encounter: Payer: Medicaid Other | Admitting: Family Medicine

## 2017-12-03 ENCOUNTER — Encounter: Payer: Medicaid Other | Admitting: Student in an Organized Health Care Education/Training Program

## 2017-12-05 ENCOUNTER — Other Ambulatory Visit: Payer: Self-pay

## 2017-12-05 ENCOUNTER — Encounter: Payer: Self-pay | Admitting: Student in an Organized Health Care Education/Training Program

## 2017-12-05 ENCOUNTER — Ambulatory Visit
Payer: Medicaid Other | Attending: Student in an Organized Health Care Education/Training Program | Admitting: Student in an Organized Health Care Education/Training Program

## 2017-12-05 VITALS — BP 116/73 | HR 78 | Temp 97.7°F | Ht 65.0 in | Wt 190.0 lb

## 2017-12-05 DIAGNOSIS — E559 Vitamin D deficiency, unspecified: Secondary | ICD-10-CM

## 2017-12-05 DIAGNOSIS — Z5181 Encounter for therapeutic drug level monitoring: Secondary | ICD-10-CM | POA: Insufficient documentation

## 2017-12-05 DIAGNOSIS — E538 Deficiency of other specified B group vitamins: Secondary | ICD-10-CM | POA: Diagnosis not present

## 2017-12-05 DIAGNOSIS — F17210 Nicotine dependence, cigarettes, uncomplicated: Secondary | ICD-10-CM | POA: Insufficient documentation

## 2017-12-05 DIAGNOSIS — Z955 Presence of coronary angioplasty implant and graft: Secondary | ICD-10-CM | POA: Diagnosis not present

## 2017-12-05 DIAGNOSIS — E1140 Type 2 diabetes mellitus with diabetic neuropathy, unspecified: Secondary | ICD-10-CM | POA: Insufficient documentation

## 2017-12-05 DIAGNOSIS — M7918 Myalgia, other site: Secondary | ICD-10-CM | POA: Diagnosis not present

## 2017-12-05 DIAGNOSIS — Z7982 Long term (current) use of aspirin: Secondary | ICD-10-CM | POA: Insufficient documentation

## 2017-12-05 DIAGNOSIS — M5441 Lumbago with sciatica, right side: Secondary | ICD-10-CM | POA: Insufficient documentation

## 2017-12-05 DIAGNOSIS — E669 Obesity, unspecified: Secondary | ICD-10-CM | POA: Insufficient documentation

## 2017-12-05 DIAGNOSIS — I2510 Atherosclerotic heart disease of native coronary artery without angina pectoris: Secondary | ICD-10-CM | POA: Diagnosis not present

## 2017-12-05 DIAGNOSIS — Z8673 Personal history of transient ischemic attack (TIA), and cerebral infarction without residual deficits: Secondary | ICD-10-CM | POA: Insufficient documentation

## 2017-12-05 DIAGNOSIS — G629 Polyneuropathy, unspecified: Secondary | ICD-10-CM | POA: Diagnosis not present

## 2017-12-05 DIAGNOSIS — G894 Chronic pain syndrome: Secondary | ICD-10-CM

## 2017-12-05 DIAGNOSIS — I110 Hypertensive heart disease with heart failure: Secondary | ICD-10-CM | POA: Diagnosis not present

## 2017-12-05 DIAGNOSIS — I480 Paroxysmal atrial fibrillation: Secondary | ICD-10-CM | POA: Insufficient documentation

## 2017-12-05 DIAGNOSIS — G4700 Insomnia, unspecified: Secondary | ICD-10-CM | POA: Diagnosis not present

## 2017-12-05 DIAGNOSIS — Z6831 Body mass index (BMI) 31.0-31.9, adult: Secondary | ICD-10-CM | POA: Diagnosis not present

## 2017-12-05 DIAGNOSIS — Z9981 Dependence on supplemental oxygen: Secondary | ICD-10-CM | POA: Diagnosis not present

## 2017-12-05 DIAGNOSIS — Z79899 Other long term (current) drug therapy: Secondary | ICD-10-CM | POA: Insufficient documentation

## 2017-12-05 DIAGNOSIS — I25119 Atherosclerotic heart disease of native coronary artery with unspecified angina pectoris: Secondary | ICD-10-CM | POA: Insufficient documentation

## 2017-12-05 DIAGNOSIS — M5442 Lumbago with sciatica, left side: Secondary | ICD-10-CM

## 2017-12-05 DIAGNOSIS — E1165 Type 2 diabetes mellitus with hyperglycemia: Secondary | ICD-10-CM | POA: Diagnosis not present

## 2017-12-05 DIAGNOSIS — I252 Old myocardial infarction: Secondary | ICD-10-CM | POA: Insufficient documentation

## 2017-12-05 DIAGNOSIS — Z8249 Family history of ischemic heart disease and other diseases of the circulatory system: Secondary | ICD-10-CM | POA: Insufficient documentation

## 2017-12-05 DIAGNOSIS — J449 Chronic obstructive pulmonary disease, unspecified: Secondary | ICD-10-CM | POA: Insufficient documentation

## 2017-12-05 DIAGNOSIS — G8929 Other chronic pain: Secondary | ICD-10-CM

## 2017-12-05 DIAGNOSIS — E785 Hyperlipidemia, unspecified: Secondary | ICD-10-CM | POA: Diagnosis not present

## 2017-12-05 DIAGNOSIS — F419 Anxiety disorder, unspecified: Secondary | ICD-10-CM | POA: Diagnosis not present

## 2017-12-05 DIAGNOSIS — Z888 Allergy status to other drugs, medicaments and biological substances status: Secondary | ICD-10-CM | POA: Insufficient documentation

## 2017-12-05 DIAGNOSIS — Z880 Allergy status to penicillin: Secondary | ICD-10-CM | POA: Insufficient documentation

## 2017-12-05 DIAGNOSIS — G4733 Obstructive sleep apnea (adult) (pediatric): Secondary | ICD-10-CM | POA: Insufficient documentation

## 2017-12-05 DIAGNOSIS — Z7902 Long term (current) use of antithrombotics/antiplatelets: Secondary | ICD-10-CM | POA: Insufficient documentation

## 2017-12-05 DIAGNOSIS — Z833 Family history of diabetes mellitus: Secondary | ICD-10-CM | POA: Insufficient documentation

## 2017-12-05 DIAGNOSIS — Z7984 Long term (current) use of oral hypoglycemic drugs: Secondary | ICD-10-CM | POA: Insufficient documentation

## 2017-12-05 DIAGNOSIS — I509 Heart failure, unspecified: Secondary | ICD-10-CM | POA: Insufficient documentation

## 2017-12-05 MED ORDER — NEBIVOLOL HCL 10 MG PO TABS: 10.0000 mg | ORAL_TABLET | Freq: Every day | ORAL | 6 refills | Status: DC

## 2017-12-05 MED ORDER — PREGABALIN 50 MG PO CAPS: ORAL_CAPSULE | ORAL | 0 refills | Status: DC

## 2017-12-05 NOTE — Progress Notes (Signed)
Patient's Name: Barbara Bowen  MRN: [id number]  Referring Provider: Steele Sizer, MD  DOB: [birth year]  PCP: Barbara Sizer, MD  DOS: 12/05/2017  Note by: Barbara Santa, MD  Service setting: Ambulatory outpatient  Specialty: Interventional Pain Management  Location: [facility] (AMB) Pain Management Facility    Patient type: Established   Primary Reason(s) for Visit: Encounter for prescription drug management. (Level of risk: moderate)  CC: Back Pain  HPI  Barbara Bowen is a 62 y.o. year old, female patient, who comes today for a medication management evaluation. 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 Her primarily concern today is the Back Pain  Pain Assessment: Location: Right Back Radiating: Pain radiaties down right hip and leg Onset: More than a month ago Duration: Chronic pain Quality: Sharp, Nagging, Discomfort, Constant Severity: 2 /10 (subjective, self-reported pain score)  Note: Reported level is compatible with observation.                         When using our objective Pain Scale, levels between 6 and 10/10 are said to belong in an emergency room, as it progressively worsens from a  6/10, described as severely limiting, requiring emergency care not usually available at an outpatient pain management facility. At a 6/10 level, communication becomes difficult and requires great effort. Assistance to reach the emergency department may be required. Facial flushing and profuse sweating along with potentially dangerous increases in heart rate and blood pressure will be evident. Effect on ADL: cant stand in the shower, no prolonged standing or walking Timing: Constant Modifying factors: muscle rub after getting out of the tub BP: 116/73  HR: 78  Barbara Bowen was last scheduled for an appointment on 10/08/2017 for medication management. During today's appointment we reviewed Barbara Bowen's chronic pain status, as well as her outpatient medication regimen.  Patient follows up for medication management.  States that tizanidine was resulting in dry mouth so she discontinued.  Only noticed mild benefit with diclofenac.  The patient  reports that she does not use drugs. Her body mass index is 31.62 kg/m.  Further details on both, my assessment(s), as well as the proposed treatment plan, please see below.   Laboratory Chemistry  Inflammation Markers (CRP: Acute Phase) (ESR: Chronic Phase) Lab Results  Component Value Date   ESRSEDRATE 18 12/24/2014                         Rheumatology Markers No results found for: RF, ANA, LABURIC, URICUR, LYMEIGGIGMAB, LYMEABIGMQN, HLAB27                      Renal Function Markers Lab Results  Component Value Date   BUN 9 02/22/2017   CREATININE 0.70 00/86/7619   BCR NOT APPLICABLE 50/93/2671   GFRAA >60 02/22/2017   GFRNONAA >60 02/22/2017  Hepatic Function Markers Lab Results  Component Value Date   AST 19 01/11/2017   ALT 23 01/11/2017   ALBUMIN 4.3 08/23/2015   ALKPHOS 120 (H) 08/23/2015                        Electrolytes Lab Results  Component Value Date   NA 133 (L) 02/22/2017   K 3.7  02/22/2017   CL 96 (L) 02/22/2017   CALCIUM 9.5 02/22/2017   MG 2.1 10/29/2013                        Neuropathy Markers Lab Results  Component Value Date   VITAMINB12 476 01/11/2017   FOLATE 11.5 12/24/2014   HGBA1C >14.0% 07/12/2017   HIV Non Reactive 03/11/2015                        Bone Pathology Markers Lab Results  Component Value Date   VD25OH 10 (L) 01/11/2017                         Coagulation Parameters Lab Results  Component Value Date   INR 0.9 12/14/2014   LABPROT 9.8 12/14/2014   APTT 42.2 (H) 10/28/2013   PLT 221 02/22/2017                        Cardiovascular Markers Lab Results  Component Value Date   CKTOTAL 81 10/29/2013   CKMB 2.7 10/29/2013   TROPONINI <0.03 02/22/2017   HGB 16.6 (H) 02/22/2017   HCT 48.1 (H) 02/22/2017                         CA Markers No results found for: CEA, CA125, LABCA2                      Note: Lab results reviewed.  Recent Diagnostic Imaging Results  DG Hand Complete Right CLINICAL DATA:  Felt a pop in the right hand. Bruising of the right palm.  EXAM: RIGHT HAND - COMPLETE 3+ VIEW  COMPARISON:  None.  FINDINGS: There is no evidence of fracture or dislocation. There is no evidence of arthropathy or other focal bone abnormality. Soft tissues are unremarkable.  IMPRESSION: No acute osseous injury of the right hand.  Electronically Signed   By: Barbara Bowen   On: 06/23/2017 10:44  Complexity Note: Imaging results reviewed. Results shared with Barbara Bowen, using Layman's terms.                         Meds   Current Outpatient Medications:  .  albuterol (PROVENTIL HFA;VENTOLIN HFA) 108 (90 Base) MCG/ACT inhaler, TAKE 2 PUFFS BY MOUTH EVERY 6 HOURS AS NEEDED, Disp: 8.5 Inhaler, Rfl: 0 .  aspirin EC 81 MG tablet, Take 1 tablet (81 mg total) by mouth daily., Disp: 90 tablet, Rfl: 3 .  BD ULTRA-FINE LANCETS lancets, Use as instructed, Disp: 100 each, Rfl: 12 .  Cholecalciferol 5000 units capsule,  Take 1 capsule (5,000 Units total) by mouth daily., Disp: 60 capsule, Rfl: 2 .  clopidogrel (PLAVIX) 75 MG tablet, TAKE 1 TABLET (75 MG TOTAL) BY MOUTH DAILY., Disp: 90 tablet, Rfl: 3 .  glucose blood (ONE TOUCH ULTRA TEST) test strip, Use as instructed, Disp: 100 each, Rfl: 12 .  liraglutide (VICTOZA) 18 MG/3ML SOPN, Inject into the skin., Disp: , Rfl:  .  metFORMIN (GLUCOPHAGE-XR) 500 MG 24 hr tablet, Take 500 mg by mouth 2 (two) times daily., Disp: , Rfl: 6 .  nitroGLYCERIN (NITRO-DUR) 0.2 mg/hr patch, Place 1 patch (0.2 mg total) onto the skin daily., Disp: 30 patch, Rfl: 6 .  nitroGLYCERIN (NITROSTAT) 0.4 MG SL tablet, Place 1 tablet (0.4 mg total) under the tongue every 5 (five) minutes as needed., Disp: 25 tablet, Rfl: 3 .  OXYGEN, Inhale 2.5 L into the lungs as needed., Disp: , Rfl:  .  rosuvastatin (CRESTOR) 40 MG tablet, Take 1 tablet (40 mg total) by mouth daily., Disp: 90 tablet, Rfl: 3 .  nebivolol (BYSTOLIC) 10 MG tablet, Take 1 tablet (10 mg total) by mouth daily., Disp: 30 tablet, Rfl: 6 .  pregabalin (LYRICA) 50 MG capsule, Take 1 capsule (50 mg total) by mouth daily for 14 days, THEN 1 capsule (50 mg total) 2 (two) times daily for 14 days, THEN 1 capsule (50 mg total) 3 (three) times daily for 14 days., Disp: 84 capsule, Rfl: 0  ROS  Constitutional: Denies any fever or chills Gastrointestinal: No reported hemesis, hematochezia, vomiting, or acute GI distress Musculoskeletal: Denies any acute onset joint swelling, redness, loss of ROM, or weakness Neurological: No reported episodes of acute onset apraxia, aphasia, dysarthria, agnosia, amnesia, paralysis, loss of coordination, or loss of consciousness  Allergies  Ms. Chick is allergic to amoxicillin-pot clavulanate; cefprozil; esomeprazole magnesium; montelukast sodium; omeprazole; penicillins; prednisone; spiriva [tiotropium bromide monohydrate]; and fluticasone-salmeterol.  PFSH  Drug: Ms. Toelle  reports that she does not  use drugs. Alcohol:  reports that she does not drink alcohol. Tobacco:  reports that she has been smoking cigarettes. She started smoking about 39 years ago. She has a 74.00 pack-year smoking history. She has never used smokeless tobacco. Medical:  has a past medical history of Anxiety, CHF (congestive heart failure) ([place]), COPD (chronic obstructive pulmonary disease) ([place]), Coronary artery disease, DM type 2 (diabetes mellitus, type 2) ([place]), Enthesopathy of hip region, Essential hypertension, Headache, TIA (transient ischemic attack), Hyperlipidemia, Insomnia, unspecified, Leukocytosis, unspecified, Lipoma of other skin and subcutaneous tissue, OA (osteoarthritis), Occlusion and stenosis of carotid artery without mention of cerebral infarction, Palpitations, Paroxysmal atrial fibrillation ([facility]), Plantar fascial fibromatosis, Proteinuria, Stroke ([place]), Tobacco use disorder, Unspecified sleep apnea, and Unspecified tinnitus. Surgical: Ms. Dirocco  has a past surgical history that includes Abdominal hysterectomy; Appendectomy; exploratory thoracotomy; Mole removal; Breast surgery (Right, 06-09-2012); Cardiac catheterization (2009); Coronary angioplasty (2015); and Cardiac catheterization (N/A, 12/16/2014). Family: family history includes Breast cancer in her maternal aunt; Coronary artery disease in her mother; Diabetes type II in her mother; Heart disease in her father.  Constitutional Exam  General appearance: Well nourished, well developed, and well hydrated. In no apparent acute distress Vitals:   12/05/17 1208  BP: 116/73  Pulse: 78  Temp: 97.7 F (36.5 C)  SpO2: 95%  Weight: 190 lb (86.2 kg)  Height: 5' 5" (1.651 m)   BMI Assessment: Estimated body mass index is 31.62 kg/m as calculated from the following:   Height as of this encounter: 5' 5" (1.651 m).   Weight as of this encounter: 190 lb (86.2 kg).  BMI interpretation table: BMI level Category Range association with higher incidence of  chronic pain  <18 kg/m2 Underweight   18.5-24.9 kg/m2 Ideal body weight   25-29.9 kg/m2 Overweight Increased incidence by 20%  30-34.9 kg/m2 Obese (Class I) Increased incidence  by 68%  35-39.9 kg/m2 Severe obesity (Class II) Increased incidence by 136%  >40 kg/m2 Extreme obesity (Class III) Increased incidence by 254%   Patient's current BMI Ideal Body weight  Body mass index is 31.62 kg/m. Ideal body weight: 57 kg (125 lb 10.6 oz) Adjusted ideal body weight: 68.7 kg (151 lb 6.4 oz)   BMI Readings from Last 4 Encounters:  12/05/17 31.62 kg/m  10/17/17 36.35 kg/m  10/08/17 37.79 kg/m  07/12/17 35.14 kg/m   Wt Readings from Last 4 Encounters:  12/05/17 190 lb (86.2 kg)  10/17/17 189 lb 4 oz (85.8 kg)  10/08/17 200 lb (90.7 kg)  07/12/17 186 lb (84.4 kg)  Psych/Mental status: Alert, oriented x 3 (person, place, & time)       Eyes: PERLA Respiratory: No evidence of acute respiratory distress  Cervical Spine Area Exam  Skin & Axial Inspection: No masses, redness, edema, swelling, or associated skin lesions Alignment: Symmetrical Functional ROM: Unrestricted ROM      Stability: No instability detected Muscle Tone/Strength: Functionally intact. No obvious neuro-muscular anomalies detected. Sensory (Neurological): Unimpaired Palpation: No palpable anomalies              Upper Extremity (UE) Exam    Side: Right upper extremity  Side: Left upper extremity  Skin & Extremity Inspection: Skin color, temperature, and hair growth are WNL. No peripheral edema or cyanosis. No masses, redness, swelling, asymmetry, or associated skin lesions. No contractures.  Skin & Extremity Inspection: Skin color, temperature, and hair growth are WNL. No peripheral edema or cyanosis. No masses, redness, swelling, asymmetry, or associated skin lesions. No contractures.  Functional ROM: Unrestricted ROM          Functional ROM: Unrestricted ROM          Muscle Tone/Strength: Functionally intact. No obvious  neuro-muscular anomalies detected.  Muscle Tone/Strength: Functionally intact. No obvious neuro-muscular anomalies detected.  Sensory (Neurological): Neuropathic pain pattern          Sensory (Neurological): Neuropathic pain pattern          Palpation: No palpable anomalies              Palpation: No palpable anomalies              Provocative Test(s):  Phalen's test: deferred Tinel's test: deferred Apley's scratch test (touch opposite shoulder):  Action 1 (Across chest): deferred Action 2 (Overhead): deferred Action 3 (LB reach): deferred   Provocative Test(s):  Phalen's test: deferred Tinel's test: deferred Apley's scratch test (touch opposite shoulder):  Action 1 (Across chest): deferred Action 2 (Overhead): deferred Action 3 (LB reach): deferred    Thoracic Spine Area Exam  Skin & Axial Inspection: No masses, redness, or swelling Alignment: Symmetrical Functional ROM: Unrestricted ROM Stability: No instability detected Muscle Tone/Strength: Functionally intact. No obvious neuro-muscular anomalies detected. Sensory (Neurological): Unimpaired Muscle strength & Tone: No palpable anomalies  Lumbar Spine Area Exam  Skin & Axial Inspection: No masses, redness, or swelling Alignment: Symmetrical Functional ROM: Unrestricted ROM       Stability: No instability detected Muscle Tone/Strength: Functionally intact. No obvious neuro-muscular anomalies detected. Sensory (Neurological): Unimpaired Palpation: No palpable anomalies       Provocative Tests: Hyperextension/rotation test: deferred today       Lumbar quadrant test (Kemp's test): deferred today       Lateral bending test: deferred today       Patrick's Maneuver: deferred today  FABER test: deferred today                   S-I anterior distraction/compression test: deferred today         S-I lateral compression test: deferred today         S-I Thigh-thrust test: deferred today         S-I Gaenslen's test:  deferred today          Gait & Posture Assessment  Ambulation: Unassisted Gait: Relatively normal for age and body habitus Posture: WNL   Lower Extremity Exam    Side: Right lower extremity  Side: Left lower extremity  Stability: No instability observed          Stability: No instability observed          Skin & Extremity Inspection: Skin color, temperature, and hair growth are WNL. No peripheral edema or cyanosis. No masses, redness, swelling, asymmetry, or associated skin lesions. No contractures.  Skin & Extremity Inspection: Skin color, temperature, and hair growth are WNL. No peripheral edema or cyanosis. No masses, redness, swelling, asymmetry, or associated skin lesions. No contractures.  Functional ROM: Unrestricted ROM                  Functional ROM: Unrestricted ROM                  Muscle Tone/Strength: Functionally intact. No obvious neuro-muscular anomalies detected.  Muscle Tone/Strength: Functionally intact. No obvious neuro-muscular anomalies detected.  Sensory (Neurological): Neuropathic pain pattern  Sensory (Neurological): Neuropathic pain pattern  Palpation: No palpable anomalies  Palpation: No palpable anomalies   Assessment  Primary Diagnosis & Pertinent Problem List: The primary encounter diagnosis was Chronic pain syndrome. Diagnoses of Chronic bilateral low back pain with bilateral sciatica, Neuropathy, Myofascial pain dysfunction syndrome, and Vitamin D deficiency were also pertinent to this visit.  Status Diagnosis  Persistent Persistent Persistent 1. Chronic pain syndrome   2. Chronic bilateral low back pain with bilateral sciatica   3. Neuropathy   4. Myofascial pain dysfunction syndrome   5. Vitamin D deficiency      General Recommendations: The pain condition that the patient suffers from is best treated with a multidisciplinary approach that involves an increase in physical activity to prevent de-conditioning and worsening of the pain cycle, as well as  psychological counseling (formal and/or informal) to address the co-morbid psychological affects of pain. Treatment will often involve judicious use of pain medications and interventional procedures to decrease the pain, allowing the patient to participate in the physical activity that will ultimately produce long-lasting pain reductions. The goal of the multidisciplinary approach is to return the patient to a higher level of overall function and to restore their ability to perform activities of daily living.  62 year old female with a history of obesity, tobacco abuse, significant cardiovascular disease with multiple coronary stents on Plavix and full-strength aspirin, history of TIAs, who presents with chronic pain of her axial low back and right hip.  She states that this pain started after she fell on ice approximately 15 years ago.  She denies any bowel or bladder discomfort.  Patient is a type II diabetic with poorly controlled diabetes with an A1c of 12.5.  In regards to motor strength, she has full strength in her lower extremities.  Patellar reflex is 2+.    In regards to therapeutic options, I do not think that the patient will be a candidate for interventional therapies given that she  has high risk to be off of her antiplatelet drugs ( aspirin and Plavix) in the context of multiple coronary stents, multiple TIAs.  I did counsel the patient on smoking cessation and explained how smoking could be contributing to her chronic pain as it makes it more difficult for her to exercise and walk without getting short of breath.  I informed the patient that we would focus on physical therapy, psychotherapy, non-opioid-based pharmacotherapy.  Patient follows up for medication management.  States that tizanidine was resulting in dry mouth so she discontinued.  Only noticed mild benefit with diclofenac.  Plan: -Discontinue Tizanidine and Diclofenac -Start Lyrica below -Vitamin D supplementation for low  vitamin D level -We will focus on non-opioid-based pharmacologic management, physical therapy, psychotherapy -On Plavix and aspirin for coronary stents and multiple TIAs with poorly controlled diabetes.  Would recommend against any interventional therapies that would require the patient to be off of her antiplatelet drugs or would deliver steroids which could worsen the patient's serum blood sugars.   Plan of Care  Pharmacotherapy (Medications Ordered): Meds ordered this encounter  Medications  . nebivolol (BYSTOLIC) 10 MG tablet    Sig: Take 1 tablet (10 mg total) by mouth daily.    Dispense:  30 tablet    Refill:  6    Please keep appointment for further refills. Thanks!  . pregabalin (LYRICA) 50 MG capsule    Sig: Take 1 capsule (50 mg total) by mouth daily for 14 days, THEN 1 capsule (50 mg total) 2 (two) times daily for 14 days, THEN 1 capsule (50 mg total) 3 (three) times daily for 14 days.    Dispense:  84 capsule    Refill:  0    Do not place this medication, or any other prescription from our practice, on "Automatic Refill". Patient may have prescription filled one day early if pharmacy is closed on scheduled refill date.    Provider-requested follow-up: Return in about 6 weeks (around 01/16/2018) for Medication Management.  Time Note: Greater than 50% of the 25 minute(s) of face-to-face time spent with Ms. Cerutti, was spent in counseling/coordination of care regarding: Ms. Frayne primary cause of pain, the treatment plan, treatment alternatives, medication side effects, the appropriate use of her medications, realistic expectations and the goals of pain management (increased in functionality).   Future Appointments  Date Time Provider [place]  01/13/2018  1:00 PM Barbara Santa, MD [facility] None  01/13/2018  1:20 PM Barbara Sizer, MD [facility]    Primary Care Physician: Barbara Sizer, MD Location: [facility] Note by: Barbara Bowen, M.D Date: 12/05/2017; Time: 1:16 PM  There are no Patient Instructions on file for this visit.

## 2017-12-05 NOTE — Progress Notes (Signed)
Nursing Pain Medication Assessment:  Safety precautions to be maintained throughout the outpatient stay will include: orient to surroundings, keep bed in low position, maintain call bell within reach at all times, provide assistance with transfer out of bed and ambulation.  Medication Inspection Compliance: Barbara Bowen did not comply with our request to bring her pills to be counted. She was reminded that bringing the medication bottles, even when empty, is a requirement.  Medication: None brought in. Pill/Patch Count: None available to be counted. Bottle Appearance: No container available. Did not bring bottle(s) to appointment. Filled Date: N/A Last Medication intake:  Today

## 2018-01-06 ENCOUNTER — Telehealth: Payer: Self-pay | Admitting: Student in an Organized Health Care Education/Training Program

## 2018-01-06 NOTE — Telephone Encounter (Signed)
Patient called pharmacy and her meds are still not prior authorized for her to pick up. Please check into this. The muscle relaxer, [facility], she has medicaid ins.

## 2018-01-06 NOTE — Telephone Encounter (Signed)
Attempted to reach patient and states that call can't be completed at this time.

## 2018-01-07 NOTE — Telephone Encounter (Signed)
Attempted again to call patient, unable to leave message.

## 2018-01-07 NOTE — Telephone Encounter (Signed)
Lyrica has been approved. Attempted to call patient, no answer, no voicemail.

## 2018-01-13 ENCOUNTER — Encounter: Payer: Self-pay | Admitting: Student in an Organized Health Care Education/Training Program

## 2018-01-13 ENCOUNTER — Other Ambulatory Visit: Payer: Self-pay

## 2018-01-13 ENCOUNTER — Ambulatory Visit: Payer: Medicaid Other | Admitting: Family Medicine

## 2018-01-13 ENCOUNTER — Ambulatory Visit
Payer: Medicaid Other | Attending: Student in an Organized Health Care Education/Training Program | Admitting: Student in an Organized Health Care Education/Training Program

## 2018-01-13 VITALS — BP 138/71 | HR 61 | Temp 98.3°F | Resp 16 | Ht 60.0 in | Wt 189.0 lb

## 2018-01-13 DIAGNOSIS — E785 Hyperlipidemia, unspecified: Secondary | ICD-10-CM | POA: Insufficient documentation

## 2018-01-13 DIAGNOSIS — Z8673 Personal history of transient ischemic attack (TIA), and cerebral infarction without residual deficits: Secondary | ICD-10-CM | POA: Diagnosis not present

## 2018-01-13 DIAGNOSIS — F17210 Nicotine dependence, cigarettes, uncomplicated: Secondary | ICD-10-CM | POA: Insufficient documentation

## 2018-01-13 DIAGNOSIS — E559 Vitamin D deficiency, unspecified: Secondary | ICD-10-CM

## 2018-01-13 DIAGNOSIS — Z7982 Long term (current) use of aspirin: Secondary | ICD-10-CM | POA: Insufficient documentation

## 2018-01-13 DIAGNOSIS — I252 Old myocardial infarction: Secondary | ICD-10-CM | POA: Insufficient documentation

## 2018-01-13 DIAGNOSIS — I509 Heart failure, unspecified: Secondary | ICD-10-CM | POA: Diagnosis not present

## 2018-01-13 DIAGNOSIS — G629 Polyneuropathy, unspecified: Secondary | ICD-10-CM

## 2018-01-13 DIAGNOSIS — E1140 Type 2 diabetes mellitus with diabetic neuropathy, unspecified: Secondary | ICD-10-CM | POA: Insufficient documentation

## 2018-01-13 DIAGNOSIS — Z9981 Dependence on supplemental oxygen: Secondary | ICD-10-CM | POA: Insufficient documentation

## 2018-01-13 DIAGNOSIS — E538 Deficiency of other specified B group vitamins: Secondary | ICD-10-CM | POA: Diagnosis not present

## 2018-01-13 DIAGNOSIS — J449 Chronic obstructive pulmonary disease, unspecified: Secondary | ICD-10-CM | POA: Insufficient documentation

## 2018-01-13 DIAGNOSIS — M5441 Lumbago with sciatica, right side: Secondary | ICD-10-CM

## 2018-01-13 DIAGNOSIS — G4733 Obstructive sleep apnea (adult) (pediatric): Secondary | ICD-10-CM | POA: Insufficient documentation

## 2018-01-13 DIAGNOSIS — Z833 Family history of diabetes mellitus: Secondary | ICD-10-CM | POA: Insufficient documentation

## 2018-01-13 DIAGNOSIS — M5442 Lumbago with sciatica, left side: Secondary | ICD-10-CM | POA: Diagnosis not present

## 2018-01-13 DIAGNOSIS — Z8249 Family history of ischemic heart disease and other diseases of the circulatory system: Secondary | ICD-10-CM | POA: Insufficient documentation

## 2018-01-13 DIAGNOSIS — Z880 Allergy status to penicillin: Secondary | ICD-10-CM | POA: Diagnosis not present

## 2018-01-13 DIAGNOSIS — H9313 Tinnitus, bilateral: Secondary | ICD-10-CM | POA: Diagnosis not present

## 2018-01-13 DIAGNOSIS — E1129 Type 2 diabetes mellitus with other diabetic kidney complication: Secondary | ICD-10-CM | POA: Insufficient documentation

## 2018-01-13 DIAGNOSIS — I110 Hypertensive heart disease with heart failure: Secondary | ICD-10-CM | POA: Diagnosis not present

## 2018-01-13 DIAGNOSIS — M7918 Myalgia, other site: Secondary | ICD-10-CM | POA: Diagnosis not present

## 2018-01-13 DIAGNOSIS — Z803 Family history of malignant neoplasm of breast: Secondary | ICD-10-CM | POA: Insufficient documentation

## 2018-01-13 DIAGNOSIS — G4700 Insomnia, unspecified: Secondary | ICD-10-CM | POA: Diagnosis not present

## 2018-01-13 DIAGNOSIS — F419 Anxiety disorder, unspecified: Secondary | ICD-10-CM | POA: Diagnosis not present

## 2018-01-13 DIAGNOSIS — G894 Chronic pain syndrome: Secondary | ICD-10-CM

## 2018-01-13 DIAGNOSIS — G8929 Other chronic pain: Secondary | ICD-10-CM

## 2018-01-13 DIAGNOSIS — Z7984 Long term (current) use of oral hypoglycemic drugs: Secondary | ICD-10-CM | POA: Insufficient documentation

## 2018-01-13 DIAGNOSIS — Z79899 Other long term (current) drug therapy: Secondary | ICD-10-CM | POA: Insufficient documentation

## 2018-01-13 DIAGNOSIS — D72829 Elevated white blood cell count, unspecified: Secondary | ICD-10-CM | POA: Diagnosis not present

## 2018-01-13 DIAGNOSIS — Z888 Allergy status to other drugs, medicaments and biological substances status: Secondary | ICD-10-CM | POA: Insufficient documentation

## 2018-01-13 DIAGNOSIS — I480 Paroxysmal atrial fibrillation: Secondary | ICD-10-CM | POA: Diagnosis not present

## 2018-01-13 DIAGNOSIS — M25551 Pain in right hip: Secondary | ICD-10-CM | POA: Diagnosis present

## 2018-01-13 DIAGNOSIS — I2510 Atherosclerotic heart disease of native coronary artery without angina pectoris: Secondary | ICD-10-CM | POA: Insufficient documentation

## 2018-01-13 MED ORDER — PREGABALIN 50 MG PO CAPS: ORAL_CAPSULE | ORAL | 0 refills | Status: DC

## 2018-01-13 NOTE — Patient Instructions (Signed)
You have been given prescription for Lyrica today.

## 2018-01-13 NOTE — Progress Notes (Signed)
Patient's Name: Barbara Bowen  MRN: [id number]  Referring Provider: Steele Sizer, MD  DOB: [birth year]  PCP: Steele Sizer, MD  DOS: 01/13/2018  Note by: Gillis Santa, MD  Service setting: Ambulatory outpatient  Specialty: Interventional Pain Management  Location: [facility] (AMB) Pain Management Facility    Patient type: Established   Primary Reason(s) for Visit: Evaluation of chronic illnesses with exacerbation, or progression (Level of risk: moderate) CC: Back Pain (lower) and Hip Pain (right)  HPI  Barbara Bowen is a 62 y.o. year old, female patient, who comes today for a follow-up evaluation. 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 Barbara Bowen was last seen on 01/06/2018. Her primarily concern today is the Back Pain (lower) and Hip Pain (right)  Pain Assessment: Location: Right Back Radiating: right hip Onset: More than a month ago Duration: Chronic pain Quality: Aching Severity: 4 /10 (subjective, self-reported pain score)  Note: Reported level is compatible with observation.                         When using our objective Pain Scale, levels between 6 and 10/10 are said to belong in an  emergency room, as it progressively worsens from a 6/10, described as severely limiting, requiring emergency care not usually available at an outpatient pain management facility. At a 6/10 level, communication becomes difficult and requires great effort. Assistance to reach the emergency department may be required. Facial flushing and profuse sweating along with potentially dangerous increases in heart rate and blood pressure will be evident. Effect on ADL: had to use a walker a couple of days. Trouble getting out of bed or up from sitting.  Timing: Constant Modifying factors: laying down or reclining BP: 138/71  HR: 61  Further details on both, my assessment(s), as well as the proposed treatment plan, please see below.  Patient was unable to get her Lyrica filled due to unclear reasons.  I recommended the patient go to another pharmacy since we have already completed prior authorization for this medication to have it filled.  Laboratory Chemistry  Inflammation Markers (CRP: Acute Phase) (ESR: Chronic Phase) Lab Results  Component Value Date   ESRSEDRATE 18 12/24/2014                         Rheumatology Markers No results found for: RF, ANA, LABURIC, URICUR, LYMEIGGIGMAB, LYMEABIGMQN, HLAB27                      Renal Function Markers Lab Results  Component Value Date   BUN 9 02/22/2017   CREATININE 0.70 12/45/8099   BCR NOT APPLICABLE 83/38/2505   GFRAA >60 02/22/2017   GFRNONAA >60 02/22/2017                             Hepatic  Function Markers Lab Results  Component Value Date   AST 19 01/11/2017   ALT 23 01/11/2017   ALBUMIN 4.3 08/23/2015   ALKPHOS 120 (H) 08/23/2015                        Electrolytes Lab Results  Component Value Date   NA 133 (L) 02/22/2017   K 3.7 02/22/2017   CL 96 (L) 02/22/2017   CALCIUM 9.5 02/22/2017   MG 2.1 10/29/2013                        Neuropathy Markers Lab Results  Component Value Date   VITAMINB12 476 01/11/2017   FOLATE  11.5 12/24/2014   HGBA1C >14.0% 07/12/2017   HIV Non Reactive 03/11/2015                        CNS Tests No results found for: COLORCSF, APPEARCSF, RBCCOUNTCSF, WBCCSF, POLYSCSF, LYMPHSCSF, EOSCSF, PROTEINCSF, GLUCCSF, JCVIRUS, CSFOLI, IGGCSF                      Bone Pathology Markers Lab Results  Component Value Date   VD25OH 10 (L) 01/11/2017                         Coagulation Parameters Lab Results  Component Value Date   INR 0.9 12/14/2014   LABPROT 9.8 12/14/2014   APTT 42.2 (H) 10/28/2013   PLT 221 02/22/2017                        Cardiovascular Markers Lab Results  Component Value Date   CKTOTAL 81 10/29/2013   CKMB 2.7 10/29/2013   TROPONINI <0.03 02/22/2017   HGB 16.6 (H) 02/22/2017   HCT 48.1 (H) 02/22/2017                         CA Markers No results found for: CEA, CA125, LABCA2                      Note: Lab results reviewed.  Recent Diagnostic Imaging Review   Ankle-L DG Complete:  Results for orders placed during the hospital encounter of 02/09/17  DG Ankle Complete Left   Narrative CLINICAL DATA:  Recent fall with left ankle pain, initial encounter  EXAM: LEFT ANKLE COMPLETE - 3+ VIEW  COMPARISON:  None.  FINDINGS: Soft tissue swelling is noted more prominent medially. No acute fracture or dislocation is noted. Mild calcaneal spurring is seen.  IMPRESSION: Soft tissue swelling without acute bony abnormality.   Electronically Signed   By: Inez Catalina M.D.   On: 02/09/2017 20:53     Foot Imaging: Foot-R DG Complete:  Results for orders placed in visit on 12/11/13  DG Foot Complete Right   Narrative Multiple views of right indicate well-developed arch with no indications  of stress fracture advanced arthritis or spur formation    Complexity Note: Imaging results reviewed. Results shared with Ms. Marner, using Layman's terms.                         Meds   Current Outpatient Medications:  .  albuterol (PROVENTIL  HFA;VENTOLIN HFA) 108 (90 Base) MCG/ACT inhaler, TAKE 2 PUFFS BY MOUTH EVERY 6 HOURS AS NEEDED, Disp: 8.5  Inhaler, Rfl: 0 .  aspirin EC 81 MG tablet, Take 1 tablet (81 mg total) by mouth daily. (Patient taking differently: Take 81 mg by mouth 4 (four) times daily. ), Disp: 90 tablet, Rfl: 3 .  BD ULTRA-FINE LANCETS lancets, Use as instructed, Disp: 100 each, Rfl: 12 .  Cholecalciferol 5000 units capsule, Take 1 capsule (5,000 Units total) by mouth daily., Disp: 60 capsule, Rfl: 2 .  clopidogrel (PLAVIX) 75 MG tablet, TAKE 1 TABLET (75 MG TOTAL) BY MOUTH DAILY., Disp: 90 tablet, Rfl: 3 .  glucose blood (ONE TOUCH ULTRA TEST) test strip, Use as instructed, Disp: 100 each, Rfl: 12 .  liraglutide (VICTOZA) 18 MG/3ML SOPN, Inject into the skin., Disp: , Rfl:  .  metFORMIN (GLUCOPHAGE-XR) 500 MG 24 hr tablet, Take 500 mg by mouth 2 (two) times daily., Disp: , Rfl: 6 .  nebivolol (BYSTOLIC) 10 MG tablet, Take 1 tablet (10 mg total) by mouth daily., Disp: 30 tablet, Rfl: 6 .  nitroGLYCERIN (NITRO-DUR) 0.2 mg/hr patch, Place 1 patch (0.2 mg total) onto the skin daily., Disp: 30 patch, Rfl: 6 .  nitroGLYCERIN (NITROSTAT) 0.4 MG SL tablet, Place 1 tablet (0.4 mg total) under the tongue every 5 (five) minutes as needed., Disp: 25 tablet, Rfl: 3 .  OXYGEN, Inhale 2.5 L into the lungs as needed., Disp: , Rfl:  .  pregabalin (LYRICA) 50 MG capsule, Take 1 capsule (50 mg total) by mouth daily for 14 days, THEN 1 capsule (50 mg total) 2 (two) times daily for 14 days, THEN 1 capsule (50 mg total) 3 (three) times daily for 14 days., Disp: 84 capsule, Rfl: 0 .  rosuvastatin (CRESTOR) 40 MG tablet, Take 1 tablet (40 mg total) by mouth daily., Disp: 90 tablet, Rfl: 3  ROS  Constitutional: Denies any fever or chills Gastrointestinal: No reported hemesis, hematochezia, vomiting, or acute GI distress Musculoskeletal: Denies any acute onset joint swelling, redness, loss of ROM, or weakness Neurological: No reported  episodes of acute onset apraxia, aphasia, dysarthria, agnosia, amnesia, paralysis, loss of coordination, or loss of consciousness  Allergies  Ms. Eaddy is allergic to amoxicillin-pot clavulanate; cefprozil; esomeprazole magnesium; montelukast sodium; omeprazole; penicillins; prednisone; spiriva [tiotropium bromide monohydrate]; and fluticasone-salmeterol.  PFSH  Drug: Ms. Loudin  reports that she does not use drugs. Alcohol:  reports that she does not drink alcohol. Tobacco:  reports that she has been smoking cigarettes. She started smoking about 39 years ago. She has a 74.00 pack-year smoking history. She has never used smokeless tobacco. Medical:  has a past medical history of Anxiety, CHF (congestive heart failure) ([place]), COPD (chronic obstructive pulmonary disease) ([place]), Coronary artery disease, DM type 2 (diabetes mellitus, type 2) ([place]), Enthesopathy of hip region, Essential hypertension, Headache, TIA (transient ischemic attack), Hyperlipidemia, Insomnia, unspecified, Leukocytosis, unspecified, Lipoma of other skin and subcutaneous tissue, OA (osteoarthritis), Occlusion and stenosis of carotid artery without mention of cerebral infarction, Palpitations, Paroxysmal atrial fibrillation ([facility]), Plantar fascial fibromatosis, Proteinuria, Stroke ([place]), Tobacco use disorder, Unspecified sleep apnea, and Unspecified tinnitus. Surgical: Ms. Cotterman  has a past surgical history that includes Abdominal hysterectomy; Appendectomy; exploratory thoracotomy; Mole removal; Breast surgery (Right, 06-09-2012); Cardiac catheterization (2009); Coronary angioplasty (2015); and Cardiac catheterization (N/A, 12/16/2014). Family: family history includes Breast cancer in her maternal aunt; Coronary artery disease in her mother; Diabetes type II in her mother; Heart disease in her father.  Constitutional Exam  General appearance: Well nourished, well developed, and well hydrated. In no apparent acute distress Vitals:  01/13/18 1310  BP: 138/71  Pulse: 61  Resp: 16  Temp: 98.3 F (36.8 C)  TempSrc: Oral  SpO2: 96%  Weight: 189 lb (85.7 kg)  Height: 5' (1.524 m)   BMI Assessment: Estimated body mass index is 36.91 kg/m as calculated from the following:   Height as of this encounter: 5' (1.524 m).   Weight as of this encounter: 189 lb (85.7 kg).  BMI interpretation table: BMI level Category Range association with higher incidence of chronic pain  <18 kg/m2 Underweight   18.5-24.9 kg/m2 Ideal body weight   25-29.9 kg/m2 Overweight Increased incidence by 20%  30-34.9 kg/m2 Obese (Class I) Increased incidence by 68%  35-39.9 kg/m2 Severe obesity (Class II) Increased incidence by 136%  >40 kg/m2 Extreme obesity (Class III) Increased incidence by 254%   Patient's current BMI Ideal Body weight  Body mass index is 36.91 kg/m. Ideal body weight: 45.5 kg (100 lb 4.9 oz) Adjusted ideal body weight: 61.6 kg (135 lb 12.6 oz)   BMI Readings from Last 4 Encounters:  01/13/18 36.91 kg/m  12/05/17 31.62 kg/m  10/17/17 36.35 kg/m  10/08/17 37.79 kg/m   Wt Readings from Last 4 Encounters:  01/13/18 189 lb (85.7 kg)  12/05/17 190 lb (86.2 kg)  10/17/17 189 lb 4 oz (85.8 kg)  10/08/17 200 lb (90.7 kg)  Psych/Mental status: Alert, oriented x 3 (person, place, & time)       Eyes: PERLA Respiratory: No evidence of acute respiratory distress  Cervical Spine Area Exam  Skin & Axial Inspection: No masses, redness, edema, swelling, or associated skin lesions Alignment: Symmetrical Functional ROM: Unrestricted ROM      Stability: No instability detected Muscle Tone/Strength: Functionally intact. No obvious neuro-muscular anomalies detected. Sensory (Neurological): Unimpaired Palpation: No palpable anomalies              Upper Extremity (UE) Exam    Side: Right upper extremity  Side: Left upper extremity  Skin & Extremity Inspection: Skin color, temperature, and hair growth are WNL. No peripheral  edema or cyanosis. No masses, redness, swelling, asymmetry, or associated skin lesions. No contractures.  Skin & Extremity Inspection: Skin color, temperature, and hair growth are WNL. No peripheral edema or cyanosis. No masses, redness, swelling, asymmetry, or associated skin lesions. No contractures.  Functional ROM: Unrestricted ROM          Functional ROM: Unrestricted ROM          Muscle Tone/Strength: Functionally intact. No obvious neuro-muscular anomalies detected.  Muscle Tone/Strength: Functionally intact. No obvious neuro-muscular anomalies detected.  Sensory (Neurological): Unimpaired          Sensory (Neurological): Unimpaired          Palpation: No palpable anomalies              Palpation: No palpable anomalies              Provocative Test(s):  Phalen's test: deferred Tinel's test: deferred Apley's scratch test (touch opposite shoulder):  Action 1 (Across chest): deferred Action 2 (Overhead): deferred Action 3 (LB reach): deferred   Provocative Test(s):  Phalen's test: deferred Tinel's test: deferred Apley's scratch test (touch opposite shoulder):  Action 1 (Across chest): deferred Action 2 (Overhead): deferred Action 3 (LB reach): deferred    Thoracic Spine Area Exam  Skin & Axial Inspection: No masses, redness, or swelling Alignment: Symmetrical Functional ROM: Unrestricted ROM Stability: No instability detected Muscle Tone/Strength: Functionally intact. No obvious neuro-muscular anomalies detected. Sensory (Neurological): Unimpaired  Muscle strength & Tone: No palpable anomalies  Lumbar Spine Area Exam  Skin & Axial Inspection: No masses, redness, or swelling Alignment: Symmetrical Functional ROM: Unrestricted ROM       Stability: No instability detected Muscle Tone/Strength: Functionally intact. No obvious neuro-muscular anomalies detected. Sensory (Neurological): Unimpaired Palpation: No palpable anomalies       Provocative Tests: Hyperextension/rotation  test: deferred today       Lumbar quadrant test (Kemp's test): deferred today       Lateral bending test: deferred today       Patrick's Maneuver: deferred today                   FABER test: deferred today                   S-I anterior distraction/compression test: deferred today         S-I lateral compression test: deferred today         S-I Thigh-thrust test: deferred today         S-I Gaenslen's test: deferred today          Gait & Posture Assessment  Ambulation: Unassisted Gait: Relatively normal for age and body habitus Posture: WNL   Lower Extremity Exam    Side: Right lower extremity  Side: Left lower extremity  Stability: No instability observed          Stability: No instability observed          Skin & Extremity Inspection: Skin color, temperature, and hair growth are WNL. No peripheral edema or cyanosis. No masses, redness, swelling, asymmetry, or associated skin lesions. No contractures.  Skin & Extremity Inspection: Skin color, temperature, and hair growth are WNL. No peripheral edema or cyanosis. No masses, redness, swelling, asymmetry, or associated skin lesions. No contractures.  Functional ROM: Unrestricted ROM                  Functional ROM: Unrestricted ROM                  Muscle Tone/Strength: Functionally intact. No obvious neuro-muscular anomalies detected.  Muscle Tone/Strength: Functionally intact. No obvious neuro-muscular anomalies detected.  Sensory (Neurological): Unimpaired  Sensory (Neurological): Unimpaired  Palpation: No palpable anomalies  Palpation: No palpable anomalies   Assessment  Primary Diagnosis & Pertinent Problem List: The primary encounter diagnosis was Chronic pain syndrome. Diagnoses of Chronic bilateral low back pain with bilateral sciatica, Neuropathy, Myofascial pain dysfunction syndrome, and Vitamin D deficiency were also pertinent to this visit.  Status Diagnosis  Persistent Persistent Persistent 1. Chronic pain syndrome   2.  Chronic bilateral low back pain with bilateral sciatica   3. Neuropathy   4. Myofascial pain dysfunction syndrome   5. Vitamin D deficiency      General Recommendations: The pain condition that the patient suffers from is best treated with a multidisciplinary approach that involves an increase in physical activity to prevent de-conditioning and worsening of the pain cycle, as well as psychological counseling (formal and/or informal) to address the co-morbid psychological affects of pain. Treatment will often involve judicious use of pain medications and interventional procedures to decrease the pain, allowing the patient to participate in the physical activity that will ultimately produce long-lasting pain reductions. The goal of the multidisciplinary approach is to return the patient to a higher level of overall function and to restore their ability to perform activities of daily living.  62 year old female with a history of obesity,  tobacco abuse, significant cardiovascular disease with multiple coronary stents on Plavix and full-strength aspirin, history of TIAs, who presents with chronic pain of her axial low back and right hip. She states that this pain started after she fell on ice approximately 15 years ago. She denies any bowel or bladder discomfort. Patient is a type II diabetic with poorly controlled diabetes with an A1c of 12.5.  In regards to therapeutic options, I do not think that the patient will be a candidate for interventional therapies given that she has high risk to be off of her antiplatelet drugs (aspirin and Plavix) in the context of multiple coronary stents, multiple TIAs. I did counsel the patient on smoking cessation and explained how smoking could be contributing to her chronic pain as it makes it more difficult for her to exercise and walk without getting short of breath.  Iinformed thepatient that we would focus on physical therapy, psychotherapy, non-opioid-based  pharmacotherapy.  Patient has tried tizanidine and diclofenac without any benefit in the past.  I will prescribe Lyrica for her again, recommend that she take it to another pharmacy other than [facility] to get it filled.  -On Plavix and aspirin for coronary stents and multiple TIAs with poorly controlled diabetes. Would recommend against any interventional therapies that would require the patient to be off of her antiplatelet drugs or would deliver steroids which could worsen the patient's serum blood sugars.  Plan of Care  Pharmacotherapy (Medications Ordered): Meds ordered this encounter  Medications  . pregabalin (LYRICA) 50 MG capsule    Sig: Take 1 capsule (50 mg total) by mouth daily for 14 days, THEN 1 capsule (50 mg total) 2 (two) times daily for 14 days, THEN 1 capsule (50 mg total) 3 (three) times daily for 14 days.    Dispense:  84 capsule    Refill:  0    Do not place this medication, or any other prescription from our practice, on "Automatic Refill". Patient may have prescription filled one day early if pharmacy is closed on scheduled refill date.   Provider-requested follow-up: Return in about 6 weeks (around 02/24/2018) for Medication Management.  Time Note: Greater than 50% of the 15 minute(s) of face-to-face time spent with Ms. Flamenco, was spent in counseling/coordination of care regarding: Ms. Haros primary cause of pain, the treatment plan, the risks and possible complications of proposed treatment, medication side effects, the appropriate use of her medications and realistic expectations.  Future Appointments  Date Time Provider [place]  02/24/2018  2:00 PM Gillis Santa, MD [facility] None    Primary Care Physician: Steele Sizer, MD Location: [facility] Note by: Gillis Santa, M.D Date: 01/13/2018; Time: 2:51 PM  Patient Instructions  You have been given prescription for Lyrica today.

## 2018-01-13 NOTE — Progress Notes (Signed)
Safety precautions to be maintained throughout the outpatient stay will include: orient to surroundings, keep bed in low position, maintain call bell within reach at all times, provide assistance with transfer out of bed and ambulation.  

## 2018-02-11 ENCOUNTER — Ambulatory Visit: Payer: Medicaid Other | Admitting: Nurse Practitioner

## 2018-02-11 ENCOUNTER — Encounter: Payer: Self-pay | Admitting: Nurse Practitioner

## 2018-02-11 VITALS — BP 120/80 | HR 90 | Temp 97.3°F | Resp 16 | Ht 60.5 in | Wt 190.0 lb

## 2018-02-11 DIAGNOSIS — M25512 Pain in left shoulder: Secondary | ICD-10-CM | POA: Diagnosis not present

## 2018-02-11 DIAGNOSIS — M79602 Pain in left arm: Secondary | ICD-10-CM | POA: Diagnosis not present

## 2018-02-11 DIAGNOSIS — Z23 Encounter for immunization: Secondary | ICD-10-CM

## 2018-02-11 DIAGNOSIS — Z720 Tobacco use: Secondary | ICD-10-CM | POA: Diagnosis not present

## 2018-02-11 MED ORDER — TIZANIDINE HCL 4 MG PO TABS: 4.0000 mg | ORAL_TABLET | Freq: Three times a day (TID) | ORAL | 0 refills | Status: DC | PRN

## 2018-02-11 NOTE — Progress Notes (Signed)
Name: Barbara Bowen   MRN: [id number]    DOB: [birth year]   Date:02/11/2018       Progress Note  Subjective  Chief Complaint  Chief Complaint  Patient presents with  . Arm Pain    left    HPI  Patient endorses left arm pain starts in the shoulder going all the way down to fingers. No position makes it better or worse. Pain is described as sharp, throbbing pain. Pain is constant. Has tried ice packs, heat, muscle rub- no relief with any of it. She takes muscle relaxer from the pain clinic- from chronic back and hip pain. Has been building furniture states initially thought pain was from that but has completed working on furniture for over a week.   Denies new weakness or paresthesias. Never had neck surgeries.  Unable to tolerate steroids   Patient Active Problem List   Diagnosis Date Noted  . Vitamin D deficiency, unspecified 05/07/2017  . Hyperlipidemia due to type 2 diabetes mellitus ([place]) 05/07/2017  . Dependence on nocturnal oxygen therapy 04/12/2017  . Dyslipidemia associated with type 2 diabetes mellitus ([place]) 06/28/2016  . Spinal stenosis 10/15/2015  . Bilateral tinnitus 08/23/2015  . B12 deficiency 03/31/2015  . OSA (obstructive sleep apnea) 03/31/2015  . 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 Intertrigo 02/23/2015  . Insomnia 12/29/2014  . Migraine without aura and without status migrainosus, not intractable 12/29/2014  . Leukocytosis 12/24/2014  . Macrocytic 12/24/2014  . Essential hypertension   . Paroxysmal atrial fibrillation (HCC)   . Diabetes mellitus with renal manifestations, uncontrolled ([place])   . History of MI (myocardial infarction) 10/30/2013  . Benign neoplasm of breast 09/12/2012  . COPD (chronic obstructive pulmonary disease) ([place]) 09/09/2012  . Smoking 03/27/2011  . Hyperlipidemia 10/28/2009  . CAD (coronary artery disease), native coronary artery 10/28/2009  . Shortness of breath 10/28/2009  . Angina  pectoris ([place]) 10/28/2009    Past Medical History:  Diagnosis Date  . Anxiety   . CHF (congestive heart failure) ([place])   . COPD (chronic obstructive pulmonary disease) ([place])   . Coronary artery disease    a. 09/2007 s/p PCI/DES to prox RCA;  b. 2012 Cath: nonobs dzs;  c. 01/2014 PCI: 95d (3.0x23 Xience [place]), RPDA 80 (PTCA), EF 60%;  d. 11/2014 Cath: LM anomalous, LAD/D1/D2 min irregs, LCX small, min irregs, OM2 small, RCA 20p ISR, 10d ISR, EF 65%.  . DM type 2 (diabetes mellitus, type 2) ([place])   . Enthesopathy of hip region   . Essential hypertension   . Headache   . Hx-TIA (transient ischemic attack)   . Hyperlipidemia   . Insomnia, unspecified   . Leukocytosis, unspecified   . Lipoma of other skin and subcutaneous tissue   . OA (osteoarthritis)   . Occlusion and stenosis of carotid artery without mention of cerebral infarction   . Palpitations   . Paroxysmal atrial fibrillation (HCC)    a. not appreciated on holter 09/2013;  b. CHA2DS2VASc = 5 (htn, DM, h/o TIA, vasc dzs, female) - not on [address].  Marland Kitchen Plantar fascial fibromatosis   . Proteinuria   . Stroke ([place])   . Tobacco use disorder    a. does not believe cigarettes contribute to her dyspnea/asthma.    Marland Kitchen Unspecified sleep apnea    a. Wears O2 via [place] @ HS.  Marland Kitchen Unspecified tinnitus     Past Surgical History:  Procedure Laterality Date  . ABDOMINAL HYSTERECTOMY    .  APPENDECTOMY    . BREAST SURGERY Right 06-09-2012   biopsy x2 9 oclock and 11 oclock  . CARDIAC CATHETERIZATION  2009   s/p right coronary artery drug-eluting stent  . CARDIAC CATHETERIZATION N/A 12/16/2014   Procedure: Left Heart Cath and Coronary Angiography;  Surgeon: Minna Merritts, MD;  Location: [facility];  Service: Cardiovascular;  Laterality: N/A;  . CORONARY ANGIOPLASTY  2015  . exploratory thoracotomy    . MOLE REMOVAL      Social History   Tobacco Use  . Smoking status: Current Every Day Smoker    Packs/day: 2.00    Years: 37.00     Pack years: 74.00    Types: Cigarettes    Start date: 02/01/1978  . Smokeless tobacco: Never Used  Substance Use Topics  . Alcohol use: No    Alcohol/week: 0.0 standard drinks     Current Outpatient Medications:  .  albuterol (PROVENTIL HFA;VENTOLIN HFA) 108 (90 Base) MCG/ACT inhaler, TAKE 2 PUFFS BY MOUTH EVERY 6 HOURS AS NEEDED, Disp: 8.5 Inhaler, Rfl: 0 .  aspirin EC 81 MG tablet, Take 1 tablet (81 mg total) by mouth daily. (Patient taking differently: Take 81 mg by mouth 4 (four) times daily. ), Disp: 90 tablet, Rfl: 3 .  BD ULTRA-FINE LANCETS lancets, Use as instructed, Disp: 100 each, Rfl: 12 .  Cholecalciferol 5000 units capsule, Take 1 capsule (5,000 Units total) by mouth daily., Disp: 60 capsule, Rfl: 2 .  clopidogrel (PLAVIX) 75 MG tablet, TAKE 1 TABLET (75 MG TOTAL) BY MOUTH DAILY., Disp: 90 tablet, Rfl: 3 .  glucose blood (ONE TOUCH ULTRA TEST) test strip, Use as instructed, Disp: 100 each, Rfl: 12 .  liraglutide (VICTOZA) 18 MG/3ML SOPN, Inject into the skin., Disp: , Rfl:  .  metFORMIN (GLUCOPHAGE-XR) 500 MG 24 hr tablet, Take 500 mg by mouth 2 (two) times daily., Disp: , Rfl: 6 .  nebivolol (BYSTOLIC) 10 MG tablet, Take 1 tablet (10 mg total) by mouth daily., Disp: 30 tablet, Rfl: 6 .  nitroGLYCERIN (NITRO-DUR) 0.2 mg/hr patch, Place 1 patch (0.2 mg total) onto the skin daily., Disp: 30 patch, Rfl: 6 .  nitroGLYCERIN (NITROSTAT) 0.4 MG SL tablet, Place 1 tablet (0.4 mg total) under the tongue every 5 (five) minutes as needed., Disp: 25 tablet, Rfl: 3 .  OXYGEN, Inhale 2.5 L into the lungs as needed., Disp: , Rfl:  .  pregabalin (LYRICA) 50 MG capsule, Take 1 capsule (50 mg total) by mouth daily for 14 days, THEN 1 capsule (50 mg total) 2 (two) times daily for 14 days, THEN 1 capsule (50 mg total) 3 (three) times daily for 14 days., Disp: 84 capsule, Rfl: 0 .  rosuvastatin (CRESTOR) 40 MG tablet, Take 1 tablet (40 mg total) by mouth daily., Disp: 90 tablet, Rfl: 3 .  tiZANidine  (ZANAFLEX) 4 MG tablet, Take 1 tablet (4 mg total) by mouth every 8 (eight) hours as needed for muscle spasms., Disp: 20 tablet, Rfl: 0  Allergies  Allergen Reactions  . Amoxicillin-Pot Clavulanate     Vomiting   . Cefprozil     Vomiting   . Esomeprazole Magnesium     unknown  . Montelukast Sodium     unknown  . Omeprazole     unknown  . Penicillins     Vomiting blood  . Prednisone     Rapid heart beat & difficulty breathing  . Spiriva [Tiotropium Bromide Monohydrate]     HA  .  Fluticasone-Salmeterol Palpitations    Also happened with Symbicort Also happened with Symbicort    ROS   No other specific complaints in a complete review of systems (except as listed in HPI above).  Objective  Vitals:   02/11/18 0954  BP: 120/80  Pulse: 90  Resp: 16  Temp: (!) 97.3 F (36.3 C)  TempSrc: Oral  SpO2: 97%  Weight: 190 lb (86.2 kg)  Height: 5' 0.5" (1.537 m)    Body mass index is 36.5 kg/m.  Nursing Note and Vital Signs reviewed.  Physical Exam     No results found for this or any previous visit (from the past 48 hour(s)).  Assessment & Plan  1. Left arm pain Discussed red flag signs, sounds like radiculopathy, patient is unable to tolerate steroids, taking aspirin and plavix discussed risks and benefits of nsaids, patient declined PT. Rest, ice - Ambulatory referral to Orthopedic Surgery - tiZANidine (ZANAFLEX) 4 MG tablet; Take 1 tablet (4 mg total) by mouth every 8 (eight) hours as needed for muscle spasms.  Dispense: 20 tablet; Refill: 0  2. Acute pain of left shoulder - Ambulatory referral to Orthopedic Surgery - tiZANidine (ZANAFLEX) 4 MG tablet; Take 1 tablet (4 mg total) by mouth every 8 (eight) hours as needed for muscle spasms.  Dispense: 20 tablet; Refill: 0  3. Flu vaccine need  - Flu Vaccine QUAD 36+ mos IM  4. Tobacco abuse Declined information, not ready to quit

## 2018-02-11 NOTE — Patient Instructions (Signed)
-   You should get a phone call within the next week to set up an orthopedic appointment - Continue ice, and heat. Can additionally take tylenol (follow instructions on the bottle)  - Take muscle relaxer prescribed as needed - If having weakness, numbness in arm please seek immediate medical attention - If we can help you quit smoking or cut down please let us know

## 2018-02-14 DIAGNOSIS — M5412 Radiculopathy, cervical region: Secondary | ICD-10-CM | POA: Insufficient documentation

## 2018-02-24 ENCOUNTER — Other Ambulatory Visit: Payer: Self-pay

## 2018-02-24 ENCOUNTER — Encounter: Payer: Self-pay | Admitting: Student in an Organized Health Care Education/Training Program

## 2018-02-24 ENCOUNTER — Ambulatory Visit
Payer: Medicaid Other | Attending: Student in an Organized Health Care Education/Training Program | Admitting: Student in an Organized Health Care Education/Training Program

## 2018-02-24 VITALS — BP 122/69 | HR 80 | Temp 98.5°F | Resp 18 | Ht 60.5 in | Wt 191.0 lb

## 2018-02-24 DIAGNOSIS — Z7982 Long term (current) use of aspirin: Secondary | ICD-10-CM | POA: Insufficient documentation

## 2018-02-24 DIAGNOSIS — M25551 Pain in right hip: Secondary | ICD-10-CM | POA: Diagnosis present

## 2018-02-24 DIAGNOSIS — J449 Chronic obstructive pulmonary disease, unspecified: Secondary | ICD-10-CM | POA: Insufficient documentation

## 2018-02-24 DIAGNOSIS — G8929 Other chronic pain: Secondary | ICD-10-CM

## 2018-02-24 DIAGNOSIS — M5441 Lumbago with sciatica, right side: Secondary | ICD-10-CM | POA: Diagnosis not present

## 2018-02-24 DIAGNOSIS — E785 Hyperlipidemia, unspecified: Secondary | ICD-10-CM | POA: Insufficient documentation

## 2018-02-24 DIAGNOSIS — Z7984 Long term (current) use of oral hypoglycemic drugs: Secondary | ICD-10-CM | POA: Diagnosis not present

## 2018-02-24 DIAGNOSIS — I509 Heart failure, unspecified: Secondary | ICD-10-CM | POA: Insufficient documentation

## 2018-02-24 DIAGNOSIS — F17210 Nicotine dependence, cigarettes, uncomplicated: Secondary | ICD-10-CM | POA: Diagnosis not present

## 2018-02-24 DIAGNOSIS — E1140 Type 2 diabetes mellitus with diabetic neuropathy, unspecified: Secondary | ICD-10-CM | POA: Diagnosis not present

## 2018-02-24 DIAGNOSIS — Z79899 Other long term (current) drug therapy: Secondary | ICD-10-CM | POA: Insufficient documentation

## 2018-02-24 DIAGNOSIS — Z880 Allergy status to penicillin: Secondary | ICD-10-CM | POA: Diagnosis not present

## 2018-02-24 DIAGNOSIS — M7918 Myalgia, other site: Secondary | ICD-10-CM

## 2018-02-24 DIAGNOSIS — G4733 Obstructive sleep apnea (adult) (pediatric): Secondary | ICD-10-CM | POA: Insufficient documentation

## 2018-02-24 DIAGNOSIS — Z8673 Personal history of transient ischemic attack (TIA), and cerebral infarction without residual deficits: Secondary | ICD-10-CM | POA: Diagnosis not present

## 2018-02-24 DIAGNOSIS — G629 Polyneuropathy, unspecified: Secondary | ICD-10-CM

## 2018-02-24 DIAGNOSIS — Z888 Allergy status to other drugs, medicaments and biological substances status: Secondary | ICD-10-CM | POA: Diagnosis not present

## 2018-02-24 DIAGNOSIS — M5442 Lumbago with sciatica, left side: Secondary | ICD-10-CM | POA: Insufficient documentation

## 2018-02-24 DIAGNOSIS — G894 Chronic pain syndrome: Secondary | ICD-10-CM | POA: Diagnosis not present

## 2018-02-24 DIAGNOSIS — I110 Hypertensive heart disease with heart failure: Secondary | ICD-10-CM | POA: Diagnosis not present

## 2018-02-24 DIAGNOSIS — M545 Low back pain: Secondary | ICD-10-CM | POA: Diagnosis present

## 2018-02-24 DIAGNOSIS — Z7902 Long term (current) use of antithrombotics/antiplatelets: Secondary | ICD-10-CM | POA: Diagnosis not present

## 2018-02-24 DIAGNOSIS — F419 Anxiety disorder, unspecified: Secondary | ICD-10-CM | POA: Diagnosis not present

## 2018-02-24 MED ORDER — PREGABALIN 50 MG PO CAPS: ORAL_CAPSULE | ORAL | 0 refills | Status: AC

## 2018-02-24 NOTE — Patient Instructions (Signed)
You have been given Rx for Lyrica.  Take 1 cap daily for 14 days.  Then Take 1 cap, 2 times a day, for 14 days.  Then, Take 1 cap, 3 times a day for 14 days.

## 2018-02-24 NOTE — Progress Notes (Signed)
Safety precautions to be maintained throughout the outpatient stay will include: orient to surroundings, keep bed in low position, maintain call bell within reach at all times, provide assistance with transfer out of bed and ambulation.  

## 2018-02-24 NOTE — Progress Notes (Signed)
Patient's Name: Barbara Bowen  MRN: [id number]  Referring Provider: Steele Sizer, MD  DOB: 1955-06-06  PCP: Steele Sizer, MD  DOS: 02/24/2018  Note by: Gillis Santa, MD  Service setting: Ambulatory outpatient  Specialty: Interventional Pain Management  Location: [facility] (AMB) Pain Management Facility    Patient type: Established   Primary Reason(s) for Visit: Evaluation of chronic illnesses with exacerbation, or progression (Level of risk: moderate) CC: Hip Pain (right) and Back Pain (lower, right)  HPI  Barbara Bowen is a 62 y.o. year old, female patient, who comes today for a follow-up evaluation. 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 Barbara Bowen was last seen on 01/13/2018. Her primarily concern today is the Hip Pain (right) and Back Pain (lower, right)  Pain Assessment: Location: Right Hip Radiating: denies Onset: More than a month ago Duration: Chronic pain Quality: Aching Severity: 4 /10 (subjective, self-reported pain score)  Note: Reported level is compatible with observation.                         When using our objective Pain Scale, levels between 6 and 10/10 are said to belong  in an emergency room, as it progressively worsens from a 6/10, described as severely limiting, requiring emergency care not usually available at an outpatient pain management facility. At a 6/10 level, communication becomes difficult and requires great effort. Assistance to reach the emergency department may be required. Facial flushing and profuse sweating along with potentially dangerous increases in heart rate and blood pressure will be evident. Effect on ADL: trouble standing from a sitting position Timing: Intermittent Modifying factors: sometimes laying down helps BP: 122/69  HR: 80  Further details on both, my assessment(s), as well as the proposed treatment plan, please see below.  Patient follows up today for medication management.  She states that she took Lyrica 100 mg daily for 1 week then increase to 100 mg twice a day.  These were not the instructions that I provided to the patient.  She is unclear why she took the medication this way.  She said that the medication resulted in cloudiness and mental fog.  I reminded the patient of the correct titration plan was written on a prescription and that we discussed previously which included Lyrica 50 mill grams nightly for 2 weeks then increase to 50 mill grams twice daily for 2 weeks and increase to 50 mill grams 3 times daily thereafter if no side effects.  Patient endorsed understanding and would like to try medication titration again.   Laboratory Chemistry  Inflammation Markers (CRP: Acute Phase) (ESR: Chronic Phase) Lab Results  Component Value Date   ESRSEDRATE 18 12/24/2014                         Rheumatology Markers No results found for: RF, ANA, [place], [place], [place], [place], HLAB27  Renal Function Markers Lab Results  Component Value Date   BUN 9 02/22/2017   CREATININE 0.70 49/67/5916   BCR NOT APPLICABLE 38/46/6599   GFRAA >60 02/22/2017   GFRNONAA >60 02/22/2017                              Hepatic Function Markers Lab Results  Component Value Date   AST 19 01/11/2017   ALT 23 01/11/2017   ALBUMIN 4.3 08/23/2015   ALKPHOS 120 (H) 08/23/2015                        Electrolytes Lab Results  Component Value Date   NA 133 (L) 02/22/2017   K 3.7 02/22/2017   CL 96 (L) 02/22/2017   CALCIUM 9.5 02/22/2017   MG 2.1 10/29/2013                        Neuropathy Markers Lab Results  Component Value Date   VITAMINB12 476 01/11/2017   FOLATE 11.5 12/24/2014   HGBA1C >14.0% 07/12/2017   HIV Non Reactive 03/11/2015                        CNS Tests No results found for: COLORCSF, APPEARCSF, RBCCOUNTCSF, WBCCSF, POLYSCSF, LYMPHSCSF, EOSCSF, PROTEINCSF, GLUCCSF, JCVIRUS, CSFOLI, IGGCSF                      Bone Pathology Markers Lab Results  Component Value Date   VD25OH 10 (L) 01/11/2017                         Coagulation Parameters Lab Results  Component Value Date   INR 0.9 12/14/2014   LABPROT 9.8 12/14/2014   APTT 42.2 (H) 10/28/2013   PLT 221 02/22/2017                        Cardiovascular Markers Lab Results  Component Value Date   CKTOTAL 81 10/29/2013   CKMB 2.7 10/29/2013   TROPONINI <0.03 02/22/2017   HGB 16.6 (H) 02/22/2017   HCT 48.1 (H) 02/22/2017                         CA Markers No results found for: CEA, CA125, LABCA2                      Note: Lab results reviewed.  Recent Diagnostic Imaging Review  Ankle-L DG Complete:  Results for orders placed during the hospital encounter of 02/09/17  DG Ankle Complete Left   Narrative CLINICAL DATA:  Recent fall with left ankle pain, initial encounter  EXAM: LEFT ANKLE COMPLETE - 3+ VIEW  COMPARISON:  None.  FINDINGS: Soft tissue swelling is noted more prominent medially. No acute fracture or dislocation is noted. Mild calcaneal spurring is seen.  IMPRESSION: Soft tissue swelling without acute bony abnormality.   Electronically Signed   By: Inez Catalina M.D.   On:  02/09/2017 20:53     Foot Imaging: Foot-R DG Complete:  Results for orders placed in visit on 12/11/13  DG Foot Complete Right   Narrative Multiple views of right indicate well-developed arch with no indications  of stress fracture advanced arthritis or spur formation    Hand Imaging: Hand-R DG  Complete:  Results for orders placed during the hospital encounter of 06/23/17  DG Hand Complete Right   Narrative CLINICAL DATA:  Felt a pop in the right hand. Bruising of the right palm.  EXAM: RIGHT HAND - COMPLETE 3+ VIEW  COMPARISON:  None.  FINDINGS: There is no evidence of fracture or dislocation. There is no evidence of arthropathy or other focal bone abnormality. Soft tissues are unremarkable.  IMPRESSION: No acute osseous injury of the right hand.   Electronically Signed   By: Kathreen Devoid   On: 06/23/2017 10:44    Hand-L DG Complete:  Results for orders placed during the hospital encounter of 02/09/17  DG Hand Complete Left   Narrative CLINICAL DATA:  Fall 2 days ago with persistent hand pain, initial encounter  EXAM: LEFT HAND - COMPLETE 3+ VIEW  COMPARISON:  None.  FINDINGS: There is no evidence of fracture or dislocation. There is no evidence of arthropathy or other focal bone abnormality. Soft tissues are unremarkable.  IMPRESSION: No acute abnormality noted.   Electronically Signed   By: Inez Catalina M.D.   On: 02/09/2017 20:52     Complexity Note: Imaging results reviewed. Results shared with Barbara Bowen, using Layman's terms.                         Meds   Current Outpatient Medications:  .  albuterol (PROVENTIL HFA;VENTOLIN HFA) 108 (90 Base) MCG/ACT inhaler, TAKE 2 PUFFS BY MOUTH EVERY 6 HOURS AS NEEDED, Disp: 8.5 Inhaler, Rfl: 0 .  aspirin EC 81 MG tablet, Take 1 tablet (81 mg total) by mouth daily., Disp: 90 tablet, Rfl: 3 .  BD ULTRA-FINE LANCETS lancets, Use as instructed, Disp: 100 each, Rfl: 12 .  Cholecalciferol 5000 units  capsule, Take 1 capsule (5,000 Units total) by mouth daily., Disp: 60 capsule, Rfl: 2 .  clopidogrel (PLAVIX) 75 MG tablet, TAKE 1 TABLET (75 MG TOTAL) BY MOUTH DAILY., Disp: 90 tablet, Rfl: 3 .  glucose blood (ONE TOUCH ULTRA TEST) test strip, Use as instructed, Disp: 100 each, Rfl: 12 .  liraglutide (VICTOZA) 18 MG/3ML SOPN, Inject into the skin once a week. , Disp: , Rfl:  .  metFORMIN (GLUCOPHAGE-XR) 500 MG 24 hr tablet, Take 1,500 mg by mouth daily before breakfast. , Disp: , Rfl: 6 .  nebivolol (BYSTOLIC) 10 MG tablet, Take 1 tablet (10 mg total) by mouth daily., Disp: 30 tablet, Rfl: 6 .  nitroGLYCERIN (NITRO-DUR) 0.2 mg/hr patch, Place 1 patch (0.2 mg total) onto the skin daily., Disp: 30 patch, Rfl: 6 .  nitroGLYCERIN (NITROSTAT) 0.4 MG SL tablet, Place 1 tablet (0.4 mg total) under the tongue every 5 (five) minutes as needed., Disp: 25 tablet, Rfl: 3 .  OXYGEN, Inhale 2.5 L into the lungs as needed., Disp: , Rfl:  .  pregabalin (LYRICA) 50 MG capsule, Take 1 capsule (50 mg total) by mouth daily for 14 days, THEN 1 capsule (50 mg total) 2 (two) times daily for 14 days, THEN 1 capsule (50 mg total) 3 (three) times daily for 14 days., Disp: 84 capsule, Rfl: 0 .  rosuvastatin (CRESTOR) 40 MG tablet, Take 1 tablet (40 mg total) by mouth daily., Disp: 90 tablet, Rfl: 3 .  tiZANidine (ZANAFLEX) 4 MG tablet, Take 1 tablet (4 mg total) by mouth every 8 (eight) hours as needed for muscle spasms., Disp: 20 tablet, Rfl: 0  ROS  Constitutional: Denies any fever or chills Gastrointestinal:  No reported hemesis, hematochezia, vomiting, or acute GI distress Musculoskeletal: Denies any acute onset joint swelling, redness, loss of ROM, or weakness Neurological: No reported episodes of acute onset apraxia, aphasia, dysarthria, agnosia, amnesia, paralysis, loss of coordination, or loss of consciousness  Allergies  Barbara Bowen is allergic to amoxicillin-pot clavulanate; cefprozil; esomeprazole magnesium;  montelukast sodium; omeprazole; penicillins; prednisone; spiriva [tiotropium bromide monohydrate]; and fluticasone-salmeterol.  PFSH  Drug: Barbara Bowen  reports that she does not use drugs. Alcohol:  reports that she does not drink alcohol. Tobacco:  reports that she has been smoking cigarettes. She started smoking about 40 years ago. She has a 74.00 pack-year smoking history. She has never used smokeless tobacco. Medical:  has a past medical history of Anxiety, CHF (congestive heart failure) ([place]), COPD (chronic obstructive pulmonary disease) ([place]), Coronary artery disease, DM type 2 (diabetes mellitus, type 2) ([place]), Enthesopathy of hip region, Essential hypertension, Headache, TIA (transient ischemic attack), Hyperlipidemia, Insomnia, unspecified, Leukocytosis, unspecified, Lipoma of other skin and subcutaneous tissue, OA (osteoarthritis), Occlusion and stenosis of carotid artery without mention of cerebral infarction, Palpitations, Paroxysmal atrial fibrillation ([facility]), Plantar fascial fibromatosis, Proteinuria, Stroke ([place]), Tobacco use disorder, Unspecified sleep apnea, and Unspecified tinnitus. Surgical: Barbara Bowen  has a past surgical history that includes Abdominal hysterectomy; Appendectomy; exploratory thoracotomy; Mole removal; Breast surgery (Right, 06-09-2012); Cardiac catheterization (2009); Coronary angioplasty (2015); and Cardiac catheterization (N/A, 12/16/2014). Family: family history includes Breast cancer in her maternal aunt; Coronary artery disease in her mother; Diabetes type II in her mother; Heart disease in her father.  Constitutional Exam  General appearance: Well nourished, well developed, and well hydrated. In no apparent acute distress Vitals:   02/24/18 1136  BP: 122/69  Pulse: 80  Resp: 18  Temp: 98.5 F (36.9 C)  TempSrc: Oral  SpO2: 96%  Weight: 191 lb (86.6 kg)  Height: 5' 0.5" (1.537 m)   BMI Assessment: Estimated body mass index is 36.69 kg/m as calculated  from the following:   Height as of this encounter: 5' 0.5" (1.537 m).   Weight as of this encounter: 191 lb (86.6 kg).  BMI interpretation table: BMI level Category Range association with higher incidence of chronic pain  <18 kg/m2 Underweight   18.5-24.9 kg/m2 Ideal body weight   25-29.9 kg/m2 Overweight Increased incidence by 20%  30-34.9 kg/m2 Obese (Class I) Increased incidence by 68%  35-39.9 kg/m2 Severe obesity (Class II) Increased incidence by 136%  >40 kg/m2 Extreme obesity (Class III) Increased incidence by 254%   Patient's current BMI Ideal Body weight  Body mass index is 36.69 kg/m. Ideal body weight: 46.6 kg (102 lb 13.5 oz) Adjusted ideal body weight: 62.6 kg (138 lb 1.7 oz)   BMI Readings from Last 4 Encounters:  02/24/18 36.69 kg/m  02/11/18 36.50 kg/m  01/13/18 36.91 kg/m  12/05/17 31.62 kg/m   Wt Readings from Last 4 Encounters:  02/24/18 191 lb (86.6 kg)  02/11/18 190 lb (86.2 kg)  01/13/18 189 lb (85.7 kg)  12/05/17 190 lb (86.2 kg)  Psych/Mental status: Alert, oriented x 3 (person, place, & time)       Eyes: PERLA Respiratory: No evidence of acute respiratory distress  Cervical Spine Area Exam  Skin & Axial Inspection: No masses, redness, edema, swelling, or associated skin lesions Alignment: Symmetrical Functional ROM: Unrestricted ROM      Stability: No instability detected Muscle Tone/Strength: Functionally intact. No obvious neuro-muscular anomalies detected. Sensory (Neurological): Unimpaired Palpation: No palpable anomalies  Upper Extremity (UE) Exam    Side: Right upper extremity  Side: Left upper extremity  Skin & Extremity Inspection: Skin color, temperature, and hair growth are WNL. No peripheral edema or cyanosis. No masses, redness, swelling, asymmetry, or associated skin lesions. No contractures.  Skin & Extremity Inspection: Skin color, temperature, and hair growth are WNL. No peripheral edema or cyanosis. No masses,  redness, swelling, asymmetry, or associated skin lesions. No contractures.  Functional ROM: Unrestricted ROM          Functional ROM: Unrestricted ROM          Muscle Tone/Strength: Functionally intact. No obvious neuro-muscular anomalies detected.  Muscle Tone/Strength: Functionally intact. No obvious neuro-muscular anomalies detected.  Sensory (Neurological): Unimpaired          Sensory (Neurological): Unimpaired          Palpation: No palpable anomalies              Palpation: No palpable anomalies              Provocative Test(s):  Phalen's test: deferred Tinel's test: deferred Apley's scratch test (touch opposite shoulder):  Action 1 (Across chest): deferred Action 2 (Overhead): deferred Action 3 (LB reach): deferred   Provocative Test(s):  Phalen's test: deferred Tinel's test: deferred Apley's scratch test (touch opposite shoulder):  Action 1 (Across chest): deferred Action 2 (Overhead): deferred Action 3 (LB reach): deferred    Thoracic Spine Area Exam  Skin & Axial Inspection: No masses, redness, or swelling Alignment: Symmetrical Functional ROM: Unrestricted ROM Stability: No instability detected Muscle Tone/Strength: Functionally intact. No obvious neuro-muscular anomalies detected. Sensory (Neurological): Unimpaired Muscle strength & Tone: No palpable anomalies  Lumbar Spine Area Exam  Skin & Axial Inspection: No masses, redness, or swelling Alignment: Symmetrical Functional ROM: Decreased ROM affecting both sides Stability: No instability detected Muscle Tone/Strength: Functionally intact. No obvious neuro-muscular anomalies detected. Sensory (Neurological): Musculoskeletal pain pattern Palpation: No palpable anomalies       Provocative Tests: Hyperextension/rotation test: (+) bilaterally for facet joint pain. Lumbar quadrant test (Kemp's test): deferred today       Lateral bending test: deferred today       Patrick's Maneuver: deferred today                    FABER test: deferred today                   S-I anterior distraction/compression test: deferred today         S-I lateral compression test: deferred today         S-I Thigh-thrust test: deferred today         S-I Gaenslen's test: deferred today          Gait & Posture Assessment  Ambulation: Unassisted Gait: Relatively normal for age and body habitus Posture: WNL   Lower Extremity Exam    Side: Right lower extremity  Side: Left lower extremity  Stability: No instability observed          Stability: No instability observed          Skin & Extremity Inspection: Skin color, temperature, and hair growth are WNL. No peripheral edema or cyanosis. No masses, redness, swelling, asymmetry, or associated skin lesions. No contractures.  Skin & Extremity Inspection: Skin color, temperature, and hair growth are WNL. No peripheral edema or cyanosis. No masses, redness, swelling, asymmetry, or associated skin lesions. No contractures.  Functional ROM: Unrestricted ROM  Functional ROM: Unrestricted ROM                  Muscle Tone/Strength: Functionally intact. No obvious neuro-muscular anomalies detected.  Muscle Tone/Strength: Functionally intact. No obvious neuro-muscular anomalies detected.  Sensory (Neurological): Neuropathic pain pattern  Sensory (Neurological): Neuropathic pain pattern  Palpation: No palpable anomalies  Palpation: No palpable anomalies   Assessment  Primary Diagnosis & Pertinent Problem List: The primary encounter diagnosis was Chronic pain syndrome. Diagnoses of Chronic bilateral low back pain with bilateral sciatica, Neuropathy, and Myofascial pain dysfunction syndrome were also pertinent to this visit.  Status Diagnosis  Persistent Persistent Persistent 1. Chronic pain syndrome   2. Chronic bilateral low back pain with bilateral sciatica   3. Neuropathy   4. Myofascial pain dysfunction syndrome      General Recommendations: The pain condition that the  patient suffers from is best treated with a multidisciplinary approach that involves an increase in physical activity to prevent de-conditioning and worsening of the pain cycle, as well as psychological counseling (formal and/or informal) to address the co-morbid psychological affects of pain. Treatment will often involve judicious use of pain medications and interventional procedures to decrease the pain, allowing the patient to participate in the physical activity that will ultimately produce long-lasting pain reductions. The goal of the multidisciplinary approach is to return the patient to a higher level of overall function and to restore their ability to perform activities of daily living.  62 year old female with a history of obesity, tobacco abuse, significant cardiovascular disease with multiple coronary stents on Plavix and full-strength aspirin, history of TIAs, who presents with chronic pain of her axial low back and right hip. She states that this pain started after she fell on ice approximately 15 years ago. She denies any bowel or bladder discomfort. Patient is a type II diabetic with poorly controlled diabetes with an A1c of 12.5.  In regards to therapeutic options, I do not think that the patient will be a candidate for interventional therapies given that she has high risk to be off of her antiplatelet drugs (aspirin and Plavix) in the context of multiple coronary stents, multiple TIAs. I did counsel the patient on smoking cessation and explained how smoking could be contributing to her chronic pain as it makes it more difficult for her to exercise and walk without getting short of breath.  Iinformed thepatient that we would focus on physical therapy, psychotherapy, non-opioid-based pharmacotherapy.  Patient follows up today for medication management.  She states that she took Lyrica 100 mg daily for 1 week then increased to 100 mg twice a day.  These were not the instructions that  I provided to the patient.  She is unclear why she took the medication this way.  She said that the medication resulted in cloudiness and mental fog.  I reminded the patient of the correct titration plan that was written on a prescription and that we discussed previously which included Lyrica 50 mill grams nightly for 2 weeks then increase to 50 mill grams twice daily for 2 weeks and increase to 50 mill grams 3 times daily thereafter if no side effects.  Patient endorsed understanding and would like to try medication titration again of Lyrica as below.  -On Plavix and aspirin for coronary stents and multiple TIAs with poorly controlled diabetes. Would recommend against any interventional therapies that would require the patient to be off of her antiplatelet drugs or would deliver steroids which could worsen the patient's serum  blood sugars.   Plan of Care  Pharmacotherapy (Medications Ordered): Meds ordered this encounter  Medications  . pregabalin (LYRICA) 50 MG capsule    Sig: Take 1 capsule (50 mg total) by mouth daily for 14 days, THEN 1 capsule (50 mg total) 2 (two) times daily for 14 days, THEN 1 capsule (50 mg total) 3 (three) times daily for 14 days.    Dispense:  84 capsule    Refill:  0    Do not place this medication, or any other prescription from our practice, on "Automatic Refill". Patient may have prescription filled one day early if pharmacy is closed on scheduled refill date.   Time Note: Greater than 50% of the 25 minute(s) of face-to-face time spent with Barbara Bowen, was spent in counseling/coordination of care regarding: Barbara Bowen primary cause of pain, the treatment plan, the risks and possible complications of proposed treatment, medication side effects, the appropriate use of her medications and realistic expectations.  Provider-requested follow-up: Return in about 6 weeks (around 04/07/2018) for Medication Management.  Future Appointments  Date Time Provider  [place]  04/03/2018  9:45 AM Gillis Santa, MD [facility] None    Primary Care Physician: Steele Sizer, MD Location: [facility] Note by: Gillis Santa, M.D Date: 02/24/2018; Time: 12:47 PM  Patient Instructions  You have been given Rx for Lyrica.  Take 1 cap daily for 14 days.  Then Take 1 cap, 2 times a day, for 14 days.  Then, Take 1 cap, 3 times a day for 14 days.

## 2018-04-03 ENCOUNTER — Encounter: Payer: Medicaid Other | Admitting: Student in an Organized Health Care Education/Training Program

## 2018-06-17 ENCOUNTER — Other Ambulatory Visit: Payer: Self-pay

## 2018-06-17 MED ORDER — NEBIVOLOL HCL 10 MG PO TABS: 10.0000 mg | ORAL_TABLET | Freq: Every day | ORAL | 3 refills | Status: DC

## 2018-06-20 ENCOUNTER — Telehealth: Payer: Self-pay

## 2018-06-20 NOTE — Telephone Encounter (Signed)
Pt would like Bystolic samples. 

## 2018-06-20 NOTE — Telephone Encounter (Signed)
Made patients spouse aware that samples are upfront for pick up.   Medication Samples have been provided to the patient.  Drug name: Bystolic       Strength: 10MG         Qty: 2 bottles  LOT: [id number]  Exp.Date: 01/2020  Janan Ridge 1:51 PM 06/20/2018

## 2018-07-01 ENCOUNTER — Other Ambulatory Visit: Payer: Self-pay

## 2018-07-01 NOTE — Telephone Encounter (Signed)
Please see message below possible PA needed.

## 2018-07-01 NOTE — Telephone Encounter (Signed)
Please call to discuss Bystolic, pharmacy states PA is needed.

## 2018-07-03 ENCOUNTER — Telehealth: Payer: Self-pay | Admitting: *Deleted

## 2018-07-03 NOTE — Telephone Encounter (Signed)
Placed Bystolic 10 mg samples upfront for pick up.

## 2018-07-03 NOTE — Telephone Encounter (Signed)
PA done through [employer] [phone]. PA has been submitted awaiting approval can take up to 24 hours for response. [id number] [place]#[id number]

## 2018-07-03 NOTE — Telephone Encounter (Signed)
Pt requiring PA for Bystolic 10 mg tablet. Pt

## 2018-07-03 NOTE — Telephone Encounter (Signed)
PA has been submitted through [place] [employer] awaiting approval. PA approval can take up to 24 hrs for response.

## 2018-07-03 NOTE — Telephone Encounter (Signed)
Patient calling to check on status of Prior Authorization for Bystolic Patient will be out of medication in 2 days  Please advise

## 2018-07-11 NOTE — Telephone Encounter (Signed)
Spoke with Butch Penny with [employer]. Bystolic was approved [id number] through 06/28/2019

## 2018-10-07 ENCOUNTER — Telehealth: Payer: Self-pay

## 2018-10-07 NOTE — Telephone Encounter (Signed)
Informed patient her last Flu shot was 02/11/2018 and will not need another one until after 01/2019.

## 2018-10-07 NOTE — Telephone Encounter (Signed)
Copied from [place] [phone]. Topic: General - Other >> Oct 07, 2018  2:13 PM Celene Kras A wrote: Reason for CRM: Pt called to request date for her last flu shot as well as schedule her next one. Please advise.

## 2018-10-16 ENCOUNTER — Other Ambulatory Visit: Payer: Self-pay

## 2018-10-16 ENCOUNTER — Ambulatory Visit (INDEPENDENT_AMBULATORY_CARE_PROVIDER_SITE_OTHER): Payer: Medicaid Other | Admitting: Family Medicine

## 2018-10-16 ENCOUNTER — Encounter: Payer: Self-pay | Admitting: Family Medicine

## 2018-10-16 DIAGNOSIS — Z636 Dependent relative needing care at home: Secondary | ICD-10-CM

## 2018-10-16 DIAGNOSIS — R454 Irritability and anger: Secondary | ICD-10-CM | POA: Diagnosis not present

## 2018-10-16 MED ORDER — HYDROXYZINE HCL 10 MG PO TABS: 10.0000 mg | ORAL_TABLET | Freq: Three times a day (TID) | ORAL | 0 refills | Status: AC | PRN

## 2018-10-16 NOTE — Progress Notes (Signed)
Name: Barbara Bowen   MRN: [id number]    DOB: 10/24/1955   Date:10/16/2018       Progress Note  Subjective  Chief Complaint  Chief Complaint  Patient presents with  . Referral    therapist  . Medication Refill    I connected with  Barbara Bowen on 10/16/18 at  1:20 PM EDT by telephone and verified that I am speaking with the correct person using two identifiers.   I discussed the limitations, risks, security and privacy concerns of performing an evaluation and management service by telephone and the availability of in person appointments. Staff also discussed with the patient that there may be a patient responsible charge related to this service. Patient Location: Home Provider Location: Home Additional Individuals present: None  HPI  Patient presents with request referral for counseling. She states that her husband is going through some health issues right now, and she is struggling with her own anger and irritability.  Her husband has lost 150lbs in the last 3 months, having issues with his liver, bowel incontinence, and issues with his stomach lining, and with COVID-19 Pandemic his care is "on hold".  She has not been able to afford home health care at this time.  She has not considered medication as an option, but says "it probably would not hurt".  Depression screen [facility] 2/9 10/16/2018 02/24/2018 01/13/2018 12/05/2017 10/08/2017  Decreased Interest 0 0 0 0 0  Down, Depressed, Hopeless 0 0 0 0 0  PHQ - 2 Score 0 0 0 0 0  Altered sleeping 0 - - - -  Tired, decreased energy 0 - - - -  Change in appetite 0 - - - -  Feeling bad or failure about yourself  0 - - - -  Trouble concentrating 0 - - - -  Moving slowly or fidgety/restless 0 - - - -  Suicidal thoughts 0 - - - -  PHQ-9 Score 0 - - - -  Difficult doing work/chores Not difficult at all - - - -   PHQ9 Score is negative.  Patient Active Problem List   Diagnosis Date Noted  . Vitamin D deficiency, unspecified 05/07/2017   . Hyperlipidemia due to type 2 diabetes mellitus ([place]) 05/07/2017  . Dependence on nocturnal oxygen therapy 04/12/2017  . Dyslipidemia associated with type 2 diabetes mellitus ([place]) 06/28/2016  . Spinal stenosis 10/15/2015  . Bilateral tinnitus 08/23/2015  . B12 deficiency 03/31/2015  . OSA (obstructive sleep apnea) 03/31/2015  . 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 Intertrigo 02/23/2015  . Insomnia 12/29/2014  . Migraine without aura and without status migrainosus, not intractable 12/29/2014  . Leukocytosis 12/24/2014  . Macrocytic 12/24/2014  . Essential hypertension   . Paroxysmal atrial fibrillation (HCC)   . Diabetes mellitus with renal manifestations, uncontrolled ([place])   . History of MI (myocardial infarction) 10/30/2013  . Benign neoplasm of breast 09/12/2012  . COPD (chronic obstructive pulmonary disease) ([place]) 09/09/2012  . Smoking 03/27/2011  . Hyperlipidemia 10/28/2009  . CAD (coronary artery disease), native coronary artery 10/28/2009  . Shortness of breath 10/28/2009  . Angina pectoris ([place]) 10/28/2009    Social History   Tobacco Use  . Smoking status: Current Every Day Smoker    Packs/day: 2.00    Years: 37.00    Pack years: 74.00    Types: Cigarettes    Start date: 02/01/1978  . Smokeless tobacco: Never Used  Substance Use Topics  .  Alcohol use: No    Alcohol/week: 0.0 standard drinks     Current Outpatient Medications:  .  albuterol (PROVENTIL HFA;VENTOLIN HFA) 108 (90 Base) MCG/ACT inhaler, TAKE 2 PUFFS BY MOUTH EVERY 6 HOURS AS NEEDED, Disp: 8.5 Inhaler, Rfl: 0 .  aspirin EC 81 MG tablet, Take 1 tablet (81 mg total) by mouth daily., Disp: 90 tablet, Rfl: 3 .  clopidogrel (PLAVIX) 75 MG tablet, TAKE 1 TABLET (75 MG TOTAL) BY MOUTH DAILY., Disp: 90 tablet, Rfl: 3 .  metFORMIN (GLUCOPHAGE-XR) 500 MG 24 hr tablet, Take 1,500 mg by mouth daily before breakfast. , Disp: , Rfl: 6 .  nebivolol (BYSTOLIC) 10 MG tablet,  Take 1 tablet (10 mg total) by mouth daily. Please call and schedule a follow up appointment for further refills [phone], Disp: 30 tablet, Rfl: 3 .  nitroGLYCERIN (NITRO-DUR) 0.2 mg/hr patch, Place 1 patch (0.2 mg total) onto the skin daily., Disp: 30 patch, Rfl: 6 .  nitroGLYCERIN (NITROSTAT) 0.4 MG SL tablet, Place 1 tablet (0.4 mg total) under the tongue every 5 (five) minutes as needed., Disp: 25 tablet, Rfl: 3 .  OXYGEN, Inhale 2.5 L into the lungs as needed., Disp: , Rfl:  .  rosuvastatin (CRESTOR) 40 MG tablet, Take 1 tablet (40 mg total) by mouth daily., Disp: 90 tablet, Rfl: 3 .  BD ULTRA-FINE LANCETS lancets, Use as instructed, Disp: 100 each, Rfl: 12 .  glucose blood (ONE TOUCH ULTRA TEST) test strip, Use as instructed, Disp: 100 each, Rfl: 12 .  hydrOXYzine (ATARAX/VISTARIL) 10 MG tablet, Take 1 tablet (10 mg total) by mouth every 8 (eight) hours as needed for anxiety., Disp: 30 tablet, Rfl: 0 .  liraglutide (VICTOZA) 18 MG/3ML SOPN, Inject into the skin once a week. , Disp: , Rfl:  .  pregabalin (LYRICA) 50 MG capsule, Take 1 capsule (50 mg total) by mouth daily for 14 days, THEN 1 capsule (50 mg total) 2 (two) times daily for 14 days, THEN 1 capsule (50 mg total) 3 (three) times daily for 14 days., Disp: 84 capsule, Rfl: 0 .  tiZANidine (ZANAFLEX) 4 MG tablet, Take 1 tablet (4 mg total) by mouth every 8 (eight) hours as needed for muscle spasms. (Patient not taking: Reported on 10/16/2018), Disp: 20 tablet, Rfl: 0  Allergies  Allergen Reactions  . Amoxicillin-Pot Clavulanate     Vomiting   . Cefprozil     Vomiting   . Esomeprazole Magnesium     unknown  . Montelukast Sodium     unknown  . Omeprazole     unknown  . Penicillins     Vomiting blood  . Prednisone     Rapid heart beat & difficulty breathing  . Spiriva [Tiotropium Bromide Monohydrate]     HA  . Fluticasone-Salmeterol Palpitations    Also happened with Symbicort Also happened with Symbicort    I  personally reviewed active problem list, medication list, allergies, lab results with the patient/caregiver today.  ROS  Constitutional: Negative for fever or weight change.  Respiratory: Negative for cough and shortness of breath.   Cardiovascular: Negative for chest pain or palpitations.  Gastrointestinal: Negative for abdominal pain, no bowel changes.  Musculoskeletal: Negative for gait problem or joint swelling.  Skin: Negative for rash.  Neurological: Negative for dizziness or headache.  No other specific complaints in a complete review of systems (except as listed in HPI above).   Objective  Virtual encounter, vitals not obtained.  There is no height or  weight on file to calculate BMI.  Nursing Note and Vital Signs reviewed.  Physical Exam . Pulmonary/Chest: Effort normal. No respiratory distress. Speaking in complete sentences Neurological: Pt is alert and oriented to person, place, and time. Speech is normal Psychiatric: Patient has a normal mood and affect. behavior is normal. Judgment and thought content normal.    No results found for this or any previous visit (from the past 72 hour(s)).  Assessment & Plan  1. Caregiver burden - Ambulatory referral to Chronic Care Management Services  2. Irritability and anger - Patient is struggling with frustration in caring for her husband, and feels irritable, would like counseling, does not want daily medication at this point.  Needs assistance in obtaining counseling, so referral is made. - hydrOXYzine (ATARAX/VISTARIL) 10 MG tablet; Take 1 tablet (10 mg total) by mouth every 8 (eight) hours as needed for anxiety.  Dispense: 30 tablet; Refill: 0 - Ambulatory referral to Psychology  -Red flags and when to present for emergency care or RTC including fever >101.75F, chest pain, shortness of breath, new/worsening/un-resolving symptoms, reviewed with patient at time of visit. Follow up and care instructions discussed and provided  in AVS. - I discussed the assessment and treatment plan with the patient. The patient was provided an opportunity to ask questions and all were answered. The patient agreed with the plan and demonstrated an understanding of the instructions.  - The patient was advised to call back or seek an in-person evaluation if the symptoms worsen or if the condition fails to improve as anticipated.  I provided 22 minutes of non-face-to-face time during this encounter.  Hubbard Hartshorn, FNP

## 2018-10-17 ENCOUNTER — Ambulatory Visit: Payer: Self-pay | Admitting: *Deleted

## 2018-10-17 DIAGNOSIS — R454 Irritability and anger: Secondary | ICD-10-CM

## 2018-10-17 DIAGNOSIS — Z636 Dependent relative needing care at home: Secondary | ICD-10-CM

## 2018-10-17 NOTE — Chronic Care Management (AMB) (Addendum)
   Care Management    10/18/2018 Name: Barbara Bowen MRN: [id number] DOB: 1955-07-27  Referred by: Steele Sizer, MD Reason for referral : Chronic Care Management (caregiver strain)   Barbara Bowen is a 63 y.o. year old female who is a primary care patient of Steele Sizer, MD. The care management team was consulted for assistance with chronic disease management and care coordination needs.   Review of patient status, including review of consultants reports, relevant laboratory and other test results, and collaboration with appropriate care team members and the patient's provider was performed as part of comprehensive patient evaluation and provision of care management services.     Goals Addressed   None       [name] was given information about Care Management services today including:  1. Care Management services includes personalized support from designated clinical staff supervised by her physician, including individualized plan of care and coordination with other care providers 2. 24/7 contact phone numbers for assistance for urgent and routine care needs. 3. The patient may stop case management services at any time by phone call to the office staff.  Patient agreed to services and verbal consent obtained.    Follow up plan: Telephone follow up appointment with care management team member scheduled for: 10/20/18  Elliot Gurney, LCSW Clinical Social Worker  [facility] Care Management [phone]        Elliot Gurney, [place] Worker  [facility] Care Management [phone]

## 2018-10-18 NOTE — Patient Instructions (Signed)
Thank you allowing the Chronic Care Management Team to be a part of your care! It was a pleasure speaking with you today!     Barbara Bowen was given information about Care Management services today including:  1. Care Management services includes personalized support from designated clinical staff supervised by her physician, including individualized plan of care and coordination with other care providers 2. 24/7 contact phone numbers for assistance for urgent and routine care needs. 3. The patient may stop case management services at any time by phone call to the office staff.    CCM (Chronic Care Management) Team   Trish Fountain RN, BSN Nurse Care Coordinator  [phone]  Ruben Reason PharmD  Clinical Pharmacist  [phone]   [place], LCSW Clinical Social Worker [phone]  Goals Addressed   None      The patient verbalized understanding of instructions provided today and declined a print copy of patient instruction materials.   Telephone follow up appointment with care management team member scheduled for:10/20/18

## 2018-10-20 ENCOUNTER — Ambulatory Visit: Payer: Self-pay | Admitting: *Deleted

## 2018-10-20 ENCOUNTER — Other Ambulatory Visit: Payer: Self-pay | Admitting: Family Medicine

## 2018-10-20 DIAGNOSIS — Z636 Dependent relative needing care at home: Secondary | ICD-10-CM

## 2018-10-20 DIAGNOSIS — R454 Irritability and anger: Secondary | ICD-10-CM

## 2018-10-20 NOTE — Patient Instructions (Signed)
Thank you allowing the Chronic Care Management Team to be a part of your care! It was a pleasure speaking with you today!  1. Please present  to your local emergency room if thoughts of self harm or harm to others surface. 2. Please call this social worker with any questions or concerns regarding referral for psychiatry  CCM (Chronic Care Management) Team   Trish Fountain RN, BSN Nurse Care Coordinator  [phone]  Ruben Reason PharmD  Clinical Pharmacist  [phone]   [place], [place] Social Worker [phone]  Goals Addressed            This Visit's Progress   . "I need a psychiatrist to help manage my anger" (pt-stated)       Current Barriers:  . Financial constraints . Mental Health Concerns   Clinical Social Work Clinical Goal(s):  Marland Kitchen Over the next 30 days, client will work with SW to address concerns related to her anger and irritability and firm desire to follow up with a psychiatrist  Interventions: . Patient interviewed and appropriate assessments performed . Provided supportive counseling and emotional support with regard to her anger and irritability, exploring self care options and safety plan to utilize if needed  . Discussed plans with patient for ongoing care management follow up and provided patient with direct contact information for care management team . Collaborated with primary care provider re: placing a referral to [place]  Patient Self Care Activities:  . Performs ADL's independently . Performs IADL's independently  Initial goal documentation         The patient verbalized understanding of instructions provided today and declined a print copy of patient instruction materials.   The care management team will reach out to the patient again over the next 14 days.

## 2018-10-20 NOTE — Chronic Care Management (AMB) (Signed)
  Chronic Care Management    Clinical Social Work General Note  10/20/2018 Name: Barbara Bowen MRN: [id number] DOB: [birth year]  Barbara Bowen is a 63 y.o. year old female who is a primary care patient of Steele Sizer, MD. The CCM was consulted to assist the patient with Mental Health Counseling and Resources.    Review of patient status, including review of consultants reports, relevant laboratory and other test results, and collaboration with appropriate care team members and the patient's provider was performed as part of comprehensive patient evaluation and provision of chronic care management services.    SDOH (Social Determinants of Health) screening performed today. See Care Plan Entry related to challenges with: Tobacco Use Stress Physical Activity  Goals Addressed            This Visit's Progress   . "I need a psychiatrist to help manage my anger" (pt-stated)       This social worker spoke to patient today by phone. Patient discussed increased anger and irritability related to her husbands medical illness and feeling that his medical needs are not being addressed. Per patient, she feels that her anger is reaching a point where she may not be able to control it and firmly desires to follow up with a psychiatrist. Patient denies thoughts of self harm or harm to others, but feels that she can be verbally aggressive towards the people around her. She would like to avoid this. Patient states that she vents to her son often and listens to music and plays war games as an outlet. Patient strongly encouraged to visit the emergency room if she develops thoughts of self harm or harm towards other.  Current Barriers:  . Financial constraints . Mental Health Concerns   Clinical Social Work Clinical Goal(s):  Marland Kitchen Over the next 30 days, client will work with SW to address concerns related to her anger and irritability and firm desire to follow up with a psychiatrist  Interventions: .  Patient interviewed and appropriate assessments performed . Provided supportive counseling and emotional support with regard to her anger and irritability, exploring self care options and safety plan to utilize if needed  . Discussed plans with patient for ongoing care management follow up and provided patient with direct contact information for care management team . Collaborated with primary care provider re: placing a referral to [place]  Patient Self Care Activities:  . Performs ADL's independently . Performs IADL's independently  Initial goal documentation         Follow Up Plan: SW will follow up with patient by phone over the next 2 weeks to follow up on referral to psychiatry       Elliot Gurney, [place] Worker  [facility] Care Management [phone]

## 2018-10-23 ENCOUNTER — Telehealth: Payer: Self-pay

## 2018-10-23 DIAGNOSIS — R454 Irritability and anger: Secondary | ICD-10-CM

## 2018-10-23 DIAGNOSIS — Z636 Dependent relative needing care at home: Secondary | ICD-10-CM

## 2018-10-23 NOTE — Telephone Encounter (Signed)
Copied from [place] [id number]. Topic: Referral - Request for Referral >> Oct 23, 2018 11:32 AM Erick Blinks wrote: Has patient seen PCP for this complaint? Yes.   *If NO, is insurance requiring patient see PCP for this issue before PCP can refer them? Referral for which specialty: Behavioral Preferred provider/office: Previously referred office does not have availability until next June. Reason for referral: Needs new referral   Best Contact: [phone] VM okay

## 2018-10-29 NOTE — Telephone Encounter (Signed)
Pt inquire about if there was any news on finding something with Behavior health / please advise

## 2018-10-29 NOTE — Telephone Encounter (Signed)
Referral has been changed to another location with hopes of getting patient in sooner.

## 2018-10-31 DIAGNOSIS — J449 Chronic obstructive pulmonary disease, unspecified: Secondary | ICD-10-CM | POA: Diagnosis not present

## 2018-11-03 ENCOUNTER — Other Ambulatory Visit: Payer: Self-pay

## 2018-11-03 ENCOUNTER — Ambulatory Visit (INDEPENDENT_AMBULATORY_CARE_PROVIDER_SITE_OTHER): Payer: Medicaid Other | Admitting: Family Medicine

## 2018-11-03 ENCOUNTER — Encounter: Payer: Self-pay | Admitting: Family Medicine

## 2018-11-03 DIAGNOSIS — I209 Angina pectoris, unspecified: Secondary | ICD-10-CM | POA: Diagnosis not present

## 2018-11-03 DIAGNOSIS — I10 Essential (primary) hypertension: Secondary | ICD-10-CM

## 2018-11-03 DIAGNOSIS — Z1211 Encounter for screening for malignant neoplasm of colon: Secondary | ICD-10-CM

## 2018-11-03 DIAGNOSIS — E1169 Type 2 diabetes mellitus with other specified complication: Secondary | ICD-10-CM

## 2018-11-03 DIAGNOSIS — E538 Deficiency of other specified B group vitamins: Secondary | ICD-10-CM | POA: Diagnosis not present

## 2018-11-03 DIAGNOSIS — R454 Irritability and anger: Secondary | ICD-10-CM | POA: Diagnosis not present

## 2018-11-03 DIAGNOSIS — J449 Chronic obstructive pulmonary disease, unspecified: Secondary | ICD-10-CM | POA: Diagnosis not present

## 2018-11-03 DIAGNOSIS — G4733 Obstructive sleep apnea (adult) (pediatric): Secondary | ICD-10-CM

## 2018-11-03 DIAGNOSIS — I480 Paroxysmal atrial fibrillation: Secondary | ICD-10-CM | POA: Diagnosis not present

## 2018-11-03 DIAGNOSIS — E785 Hyperlipidemia, unspecified: Secondary | ICD-10-CM

## 2018-11-03 DIAGNOSIS — F329 Major depressive disorder, single episode, unspecified: Secondary | ICD-10-CM

## 2018-11-03 DIAGNOSIS — E1159 Type 2 diabetes mellitus with other circulatory complications: Secondary | ICD-10-CM | POA: Diagnosis not present

## 2018-11-03 DIAGNOSIS — E559 Vitamin D deficiency, unspecified: Secondary | ICD-10-CM

## 2018-11-03 DIAGNOSIS — Z1239 Encounter for other screening for malignant neoplasm of breast: Secondary | ICD-10-CM

## 2018-11-03 MED ORDER — ESCITALOPRAM OXALATE 10 MG PO TABS: 10.0000 mg | ORAL_TABLET | Freq: Every day | ORAL | 0 refills | Status: DC

## 2018-11-03 NOTE — Progress Notes (Signed)
Name: Barbara Bowen   MRN: [id number]    DOB: Feb 07, 1956   Date:11/03/2018       Progress Note  Subjective  Chief Complaint  Chief Complaint  Patient presents with  . Diabetes  . Referral    She has not heard back about a referral for psychiatry.    I connected with  Barbara Bowen  on 11/03/18 at  8:00 AM EDT by a video enabled telemedicine application and verified that I am speaking with the correct person using two identifiers.  I discussed the limitations of evaluation and management by telemedicine and the availability of in person appointments. The patient expressed understanding and agreed to proceed. Staff also discussed with the patient that there may be a patient responsible charge related to this service. Patient Location: at home  Provider Location: [facility]   HPI  Anger: seen by Barbara Bowen in June, she was noticing more irritability and anger problems secondary to husband's health decline. She states she is also taking care of her mother in [place].phq 9 is positive today, discussed medication , she states hydroxyzine made her feel nervous and wants xanax, explained we can try lexapro   DMII: she has lost follow up with Dr. Honor Bowen, she has been out of Victoza for a few months, states still has metformin, she does not check her sugar at home. Explained importance of follow up or at least come by for A1C . She has dyslipidemia and is still taking Crestor  Chronic pain: she lost to follow up with Dr. Holley Bowen and is out of Lyrica  COPD: she is not on medication, still smoking and is not ready to quit, she states cough has been worse , occasionally has sputum production   Angina/dyslipidemia/Afib: she has not seen cardiologist in over 6 months, explained that she needs to follow up to not run out of refills. She is blaming lack of follow up on COVID-19 but reminded her she has not had a mammogram since 2013 and has not seen endocrinologist and  cardiologist since last year and needs regular follow ups   Patient Active Problem List   Diagnosis Date Noted  . Vitamin D deficiency, unspecified 05/07/2017  . Hyperlipidemia due to type 2 diabetes mellitus ([place]) 05/07/2017  . Dependence on nocturnal oxygen therapy 04/12/2017  . Dyslipidemia associated with type 2 diabetes mellitus ([place]) 06/28/2016  . Spinal stenosis 10/15/2015  . Bilateral tinnitus 08/23/2015  . B12 deficiency 03/31/2015  . OSA (obstructive sleep apnea) 03/31/2015  . 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 Intertrigo 02/23/2015  . Insomnia 12/29/2014  . Migraine without aura and without status migrainosus, not intractable 12/29/2014  . Leukocytosis 12/24/2014  . Macrocytic 12/24/2014  . Essential hypertension   . Paroxysmal atrial fibrillation (HCC)   . Diabetes mellitus with renal manifestations, uncontrolled ([place])   . History of MI (myocardial infarction) 10/30/2013  . Benign neoplasm of breast 09/12/2012  . COPD (chronic obstructive pulmonary disease) ([place]) 09/09/2012  . Smoking 03/27/2011  . Hyperlipidemia 10/28/2009  . CAD (coronary artery disease), native coronary artery 10/28/2009  . Shortness of breath 10/28/2009  . Angina pectoris ([place]) 10/28/2009    Past Surgical History:  Procedure Laterality Date  . ABDOMINAL HYSTERECTOMY    . APPENDECTOMY    . BREAST SURGERY Right 06-09-2012   biopsy x2 9 oclock and 11 oclock  . CARDIAC CATHETERIZATION  2009   s/p right coronary artery drug-eluting stent  . CARDIAC  CATHETERIZATION N/A 12/16/2014   Procedure: Left Heart Cath and Coronary Angiography;  Surgeon: Barbara Merritts, MD;  Location: [facility];  Service: Cardiovascular;  Laterality: N/A;  . CORONARY ANGIOPLASTY  2015  . exploratory thoracotomy    . MOLE REMOVAL      Family History  Problem Relation Age of Onset  . Heart disease Father        CAD  . Breast cancer Maternal Aunt   . Coronary artery  disease Mother   . Diabetes type II Mother     Social History   Socioeconomic History  . Marital status: Married    Spouse name: Not on file  . Number of children: Not on file  . Years of education: Not on file  . Highest education level: Not on file  Occupational History  . Occupation: unemployed    Employer: UNEMPLO  Social Needs  . Financial resource strain: Not on file  . Food insecurity    Worry: Not on file    Inability: Not on file  . Transportation needs    Medical: Not on file    Non-medical: Not on file  Tobacco Use  . Smoking status: Current Every Day Smoker    Packs/day: 2.00    Years: 37.00    Pack years: 74.00    Types: Cigarettes    Start date: 02/01/1978  . Smokeless tobacco: Never Used  Substance and Sexual Activity  . Alcohol use: No    Alcohol/week: 0.0 standard drinks  . Drug use: No  . Sexual activity: Not Currently  Lifestyle  . Physical activity    Days per week: 0 days    Minutes per session: 0 min  . Stress: Very much  Relationships  . Social connections    Talks on phone: More than three times a week    Gets together: Twice a week    Attends religious service: Never    Active member of club or organization: No    Attends meetings of clubs or organizations: Never    Relationship status: Married  . Intimate partner violence    Fear of current or ex partner: No    Emotionally abused: No    Physically abused: No    Forced sexual activity: No  Other Topics Concern  . Not on file  Social History Narrative   Lives in [place] with husband.  Does not work.  Does not routinely exercise.     Current Outpatient Medications:  .  albuterol (PROVENTIL HFA;VENTOLIN HFA) 108 (90 Base) MCG/ACT inhaler, TAKE 2 PUFFS BY MOUTH EVERY 6 HOURS AS NEEDED, Disp: 8.5 Inhaler, Rfl: 0 .  aspirin EC 81 MG tablet, Take 1 tablet (81 mg total) by mouth daily., Disp: 90 tablet, Rfl: 3 .  BD ULTRA-FINE LANCETS lancets, Use as instructed, Disp: 100 each, Rfl: 12  .  clopidogrel (PLAVIX) 75 MG tablet, TAKE 1 TABLET (75 MG TOTAL) BY MOUTH DAILY., Disp: 90 tablet, Rfl: 3 .  glucose blood (ONE TOUCH ULTRA TEST) test strip, Use as instructed, Disp: 100 each, Rfl: 12 .  hydrOXYzine (ATARAX/VISTARIL) 10 MG tablet, Take 1 tablet (10 mg total) by mouth every 8 (eight) hours as needed for anxiety., Disp: 30 tablet, Rfl: 0 .  metFORMIN (GLUCOPHAGE-XR) 500 MG 24 hr tablet, Take 1,500 mg by mouth daily before breakfast. , Disp: , Rfl: 6 .  nebivolol (BYSTOLIC) 10 MG tablet, Take 1 tablet (10 mg total) by mouth daily. Please call and schedule a  follow up appointment for further refills [phone], Disp: 30 tablet, Rfl: 3 .  nitroGLYCERIN (NITRO-DUR) 0.2 mg/hr patch, Place 1 patch (0.2 mg total) onto the skin daily., Disp: 30 patch, Rfl: 6 .  nitroGLYCERIN (NITROSTAT) 0.4 MG SL tablet, Place 1 tablet (0.4 mg total) under the tongue every 5 (five) minutes as needed., Disp: 25 tablet, Rfl: 3 .  OXYGEN, Inhale 2.5 L into the lungs as needed., Disp: , Rfl:  .  rosuvastatin (CRESTOR) 40 MG tablet, Take 1 tablet (40 mg total) by mouth daily., Disp: 90 tablet, Rfl: 3 .  liraglutide (VICTOZA) 18 MG/3ML SOPN, Inject into the skin once a week. , Disp: , Rfl:  .  pregabalin (LYRICA) 50 MG capsule, Take 1 capsule (50 mg total) by mouth daily for 14 days, THEN 1 capsule (50 mg total) 2 (two) times daily for 14 days, THEN 1 capsule (50 mg total) 3 (three) times daily for 14 days., Disp: 84 capsule, Rfl: 0  Allergies  Allergen Reactions  . Amoxicillin-Pot Clavulanate     Vomiting   . Cefprozil     Vomiting   . Esomeprazole Magnesium     unknown  . Montelukast Sodium     unknown  . Omeprazole     unknown  . Penicillins     Vomiting blood  . Prednisone     Rapid heart beat & difficulty breathing  . Spiriva [Tiotropium Bromide Monohydrate]     HA  . Fluticasone-Salmeterol Palpitations    Also happened with Symbicort Also happened with Symbicort    I personally  reviewed active problem list, medication list, allergies, family history, social history with the patient/caregiver today.   ROS  Ten systems reviewed and is negative except as mentioned in HPI   Objective  Virtual encounter, vitals not obtained.  There is no height or weight on file to calculate BMI.  Physical Exam  Awake, alert and oriented   PHQ2/9: Depression screen [facility] 2/9 11/03/2018 10/20/2018 10/16/2018 02/24/2018 01/13/2018  Decreased Interest 0 0 0 0 0  Down, Depressed, Hopeless 3 0 0 0 0  PHQ - 2 Score 3 0 0 0 0  Altered sleeping 3 - 0 - -  Tired, decreased energy 3 - 0 - -  Change in appetite 3 - 0 - -  Feeling bad or failure about yourself  0 - 0 - -  Trouble concentrating 0 - 0 - -  Moving slowly or fidgety/restless 0 - 0 - -  Suicidal thoughts 0 - 0 - -  PHQ-9 Score 12 - 0 - -  Difficult doing work/chores Extremely dIfficult - Not difficult at all - -   PHQ-2/9 Result is positive.    Fall Risk: Fall Risk  11/03/2018 10/16/2018 10/16/2018 02/24/2018 02/11/2018  Falls in the past year? 1 0 0 0 No  Comment - - - - -  Number falls in past yr: 1 0 0 - -  Injury with Fall? 0 0 0 - -  Comment - - - - -  Risk Factor Category  - - - - -  Risk for fall due to : - - - - -  Risk for fall due to: Comment - - - - -  Follow up - - - - -     Assessment & Plan   1. Type 2 diabetes mellitus with other circulatory complications (HCC)  - Hemoglobin A1c - Microalbumin / creatinine urine ratio  2. Angina pectoris ([place])  Taking medication, we  will check labs and explained importance of follow up with Cardiologist   3. Colon cancer screening  - Cologuard  4. Breast cancer screening  - MM 3D SCREEN BREAST BILATERAL; Future  5. Paroxysmal atrial fibrillation (HCC)  Denies palpitation, on plavix  6. COPD with asthma ([place])  Not ready to quit smoking, using oxygen, refuses inhalers because had a reaction to Spiriva  7. Dyslipidemia associated with type 2 diabetes  mellitus (HCC)  - Lipid panel - COMPLETE METABOLIC PANEL WITH GFR  8. Reactive depression  - escitalopram (LEXAPRO) 10 MG tablet; Take 1 tablet (10 mg total) by mouth daily.  Dispense: 30 tablet; Refill: 0  9. Irritability and anger  We will try lexapro, trying to move psychiatrist evaluation for a sooner date  43. OSA (obstructive sleep apnea)   11. Vitamin D deficiency  - VITAMIN D 25 Hydroxy (Vit-D Deficiency, Fractures)  12. B12 deficiency  - B12  13. Essential hypertension  - CBC with Differential/Platelet  I discussed the assessment and treatment plan with the patient. The patient was provided an opportunity to ask questions and all were answered. The patient agreed with the plan and demonstrated an understanding of the instructions.  The patient was advised to call back or seek an in-person evaluation if the symptoms worsen or if the condition fails to improve as anticipated.  I provided 25 minutes of non-face-to-face time during this encounter.

## 2018-11-04 ENCOUNTER — Telehealth: Payer: Self-pay | Admitting: Cardiovascular Disease

## 2018-11-04 ENCOUNTER — Encounter: Payer: Self-pay | Admitting: Physician Assistant

## 2018-11-04 ENCOUNTER — Other Ambulatory Visit: Payer: Self-pay | Admitting: *Deleted

## 2018-11-04 DIAGNOSIS — E1169 Type 2 diabetes mellitus with other specified complication: Secondary | ICD-10-CM | POA: Diagnosis not present

## 2018-11-04 DIAGNOSIS — E785 Hyperlipidemia, unspecified: Secondary | ICD-10-CM | POA: Diagnosis not present

## 2018-11-04 DIAGNOSIS — I10 Essential (primary) hypertension: Secondary | ICD-10-CM | POA: Diagnosis not present

## 2018-11-04 DIAGNOSIS — E1159 Type 2 diabetes mellitus with other circulatory complications: Secondary | ICD-10-CM | POA: Diagnosis not present

## 2018-11-04 DIAGNOSIS — E538 Deficiency of other specified B group vitamins: Secondary | ICD-10-CM | POA: Diagnosis not present

## 2018-11-04 DIAGNOSIS — E559 Vitamin D deficiency, unspecified: Secondary | ICD-10-CM | POA: Diagnosis not present

## 2018-11-04 MED ORDER — NEBIVOLOL HCL 10 MG PO TABS: 10.0000 mg | ORAL_TABLET | Freq: Every day | ORAL | 0 refills | Status: DC

## 2018-11-04 NOTE — Telephone Encounter (Signed)

## 2018-11-04 NOTE — Progress Notes (Signed)
Cardiology Office Note Date:  11/05/2018  Patient ID:  Barbara, Bowen Sep 02, 1955, MRN [id number] PCP:  Steele Sizer, MD  Cardiologist:  Dr. Rockey Situ, MD    Chief Complaint: Follow-up  History of Present Illness: Barbara Bowen is a 63 y.o. female with history of CAD status post LAD and RCA stenting, prior stroke/TIAs, reported PAF not appreciated on Holter from 09/2013 or any available EKG in Epic not on [facility], SVT noted in 04/2009, poorly controlled diabetes, hypertension, hyperlipidemia, COPD with ongoing tobacco abuse, asthma, obesity, chronic pain syndrome, and OSA on nocturnal oxygen who presents for follow-up of her CAD.  Patient previously underwent PCI/DES to the RCA in 2009 followed by PCI/DES to the LAD in 01/2014.  Last cardiac catheterization performed in 11/2014 showed patent stents with nonobstructive disease and normal LV systolic function.  Prior carotid artery ultrasound from 2015 showed normal bilateral carotid arteries with antegrade vertebral artery flow and normal subclavian artery hemodynamics.  She was last seen in 09/2017 and reported chronic dyspnea on exertion in the setting of COPD and ongoing tobacco abuse of up to 2 packs/day.  For the most part, she stayed in her house and rarely gets outside.  She denied any chest pain.  Patient was recently seen by PCP virtually on 11/03/2018 for routine follow-up.  It was noted the patient has been lost to follow-up with several specialists, was out of several medications, and continued to smoke.  She comes in doing well from a cardiac perspective.  No chest pain, shortness of breath, palpitations, dizziness, presyncope, or syncope or no lower extremity swelling, abdominal distention, orthopnea, PND, early satiety.  No falls, BRBPR, or melena.  Her main complaint is chronic right hip and knee pain stemming from a fall down icy steps years ago.  She continues to smoke 2 packs of cigarettes per day and is not interested in quitting.   Her weight is down 10 pounds from her last office visit.  She is trying to eat a healthier diet.  She frequently forgets to take her Crestor as she begins to "play war games."  She does not have any cardiac issues or concerns today.  Labs: 10/2018 - WBC 15.3, HGB 15.9, PLT 291, SCr 0.69, K+ 4.3, albumin 3.8, AST/ALT normal, LDL 113, TG 158, A1c 7.2 07/2017 - TSH 4.756   Past Medical History:  Diagnosis Date   Anxiety    COPD (chronic obstructive pulmonary disease) (HCC)    Coronary artery disease    a. 09/2007 s/p PCI/DES to prox RCA;  b. 2012 Cath: nonobs dzs;  c. 01/2014 PCI: 95d (3.0x23 Xience [place]), RPDA 80 (PTCA), EF 60%;  d. 11/2014 Cath: LM anomalous, LAD/D1/D2 min irregs, LCX small, min irregs, OM2 small, RCA 20p ISR, 10d ISR, EF 65%.   DM type 2 (diabetes mellitus, type 2) (HCC)    Enthesopathy of hip region    Essential hypertension    Headache    Hx-TIA (transient ischemic attack)    Hyperlipidemia    Insomnia, unspecified    Leukocytosis, unspecified    Lipoma of other skin and subcutaneous tissue    OA (osteoarthritis)    Palpitations    Paroxysmal atrial fibrillation ([place])    a. not appreciated on holter 09/2013;  b. CHA2DS2VASc = 5 (htn, DM, h/o TIA, vasc dzs, female) - not on [place].   Plantar fascial fibromatosis    Proteinuria    Stroke (HCC)    Tobacco use disorder    a.  does not believe cigarettes contribute to her dyspnea/asthma.     Unspecified sleep apnea    a. Wears O2 via [place] @ [facility].   Unspecified tinnitus     Past Surgical History:  Procedure Laterality Date   ABDOMINAL HYSTERECTOMY     APPENDECTOMY     BREAST SURGERY Right 06-09-2012   biopsy x2 9 oclock and 11 oclock   CARDIAC CATHETERIZATION  2009   s/p right coronary artery drug-eluting stent   CARDIAC CATHETERIZATION N/A 12/16/2014   Procedure: Left Heart Cath and Coronary Angiography;  Surgeon: Minna Merritts, MD;  Location: [facility];  Service: Cardiovascular;   Laterality: N/A;   CORONARY ANGIOPLASTY  2015   exploratory thoracotomy     MOLE REMOVAL      Current Meds  Medication Sig   albuterol (PROVENTIL HFA;VENTOLIN HFA) 108 (90 Base) MCG/ACT inhaler TAKE 2 PUFFS BY MOUTH EVERY 6 HOURS AS NEEDED   aspirin EC 81 MG tablet Take 1 tablet (81 mg total) by mouth daily. (Patient taking differently: Take 162 mg by mouth daily. )   BD ULTRA-FINE LANCETS lancets Use as instructed   clopidogrel (PLAVIX) 75 MG tablet TAKE 1 TABLET (75 MG TOTAL) BY MOUTH DAILY.   glucose blood (ONE TOUCH ULTRA TEST) test strip Use as instructed   metFORMIN (GLUCOPHAGE-XR) 500 MG 24 hr tablet Take 1,500 mg by mouth daily before breakfast.    nebivolol (BYSTOLIC) 10 MG tablet Take 1 tablet (10 mg total) by mouth daily. Please call and schedule a follow up appointment for further refills [phone]   nitroGLYCERIN (NITRO-DUR) 0.2 mg/hr patch Place 1 patch (0.2 mg total) onto the skin daily.   nitroGLYCERIN (NITROSTAT) 0.4 MG SL tablet Place 1 tablet (0.4 mg total) under the tongue every 5 (five) minutes as needed.   rosuvastatin (CRESTOR) 40 MG tablet Take 1 tablet (40 mg total) by mouth daily. (Patient taking differently: Take 40 mg by mouth daily. Takes "when remembers")    Allergies:   Amoxicillin-pot clavulanate, Cefprozil, Esomeprazole magnesium, Montelukast sodium, Omeprazole, Penicillins, Prednisone, Spiriva [tiotropium bromide monohydrate], and Fluticasone-salmeterol   Social History:  The patient  reports that she has been smoking cigarettes. She started smoking about 40 years ago. She has a 76.00 pack-year smoking history. She has never used smokeless tobacco. She reports that she does not drink alcohol or use drugs.   Family History:  The patient's family history includes Breast cancer in her maternal aunt; Coronary artery disease in her mother; Diabetes type II in her mother; Heart disease in her father.  ROS:   Review of Systems  Constitutional:  Negative for chills, diaphoresis, fever, malaise/fatigue and weight loss.  HENT: Negative for congestion.   Eyes: Negative for discharge and redness.  Respiratory: Negative for cough, hemoptysis, sputum production, shortness of breath and wheezing.   Cardiovascular: Negative for chest pain, palpitations, orthopnea, claudication, leg swelling and PND.  Gastrointestinal: Negative for abdominal pain, blood in stool, heartburn, melena, nausea and vomiting.  Genitourinary: Negative for hematuria.  Musculoskeletal: Positive for joint pain. Negative for falls and myalgias.       Chronic right hip and knee pain  Skin: Negative for rash.  Neurological: Negative for dizziness, tingling, tremors, sensory change, speech change, focal weakness, loss of consciousness and weakness.  Endo/Heme/Allergies: Does not bruise/bleed easily.  Psychiatric/Behavioral: Negative for substance abuse. The patient is not nervous/anxious.   All other systems reviewed and are negative.    PHYSICAL EXAM:  VS:  BP  100/60 (BP Location: Left Arm, Patient Position: Sitting, Cuff Size: Normal)    Pulse 69    Temp 98.3 F (36.8 C)    Ht 5' 0.5" (1.537 m)    Wt 179 lb (81.2 kg)    SpO2 94%    BMI 34.38 kg/m  BMI: Body mass index is 34.38 kg/m.  Physical Exam  Constitutional: She is oriented to person, place, and time. She appears well-developed and well-nourished.  HENT:  Head: Normocephalic and atraumatic.  Eyes: Right eye exhibits no discharge. Left eye exhibits no discharge.  Neck: Normal range of motion. No JVD present.  Cardiovascular: Normal rate, regular rhythm, S1 normal, S2 normal and normal heart sounds. Exam reveals no distant heart sounds, no friction rub, no midsystolic click and no opening snap.  No murmur heard. Pulses:      Dorsalis pedis pulses are 2+ on the right side and 2+ on the left side.       Posterior tibial pulses are 2+ on the right side and 2+ on the left side.  Pulmonary/Chest: Effort normal and  breath sounds normal. No respiratory distress. She has no decreased breath sounds. She has no wheezes. She has no rales. She exhibits no tenderness.  Abdominal: Soft. She exhibits no distension. There is no abdominal tenderness.  Musculoskeletal:        General: No edema.  Neurological: She is alert and oriented to person, place, and time.  Skin: Skin is warm and dry. No cyanosis. Nails show no clubbing.  Psychiatric: She has a normal mood and affect. Her speech is normal and behavior is normal. Judgment and thought content normal.     EKG:  Was ordered and interpreted by me today. Shows NSR, 69 bpm, left axis deviation, inferolateral T wave inversion, poor R wave progression along the precordial leads  Recent Labs: 11/04/2018: ALT 15; BUN 10; Creat 0.69; Hemoglobin 15.9; Platelets 291; Potassium 4.3; Sodium 137  11/04/2018: Cholesterol 177; HDL 37; LDL Cholesterol (Calc) 113; Total CHOL/HDL Ratio 4.8; Triglycerides 158   Estimated Creatinine Clearance: 71.4 mL/min (by C-G formula based on SCr of 0.69 mg/dL).   Wt Readings from Last 3 Encounters:  11/05/18 179 lb (81.2 kg)  02/24/18 191 lb (86.6 kg)  02/11/18 190 lb (86.2 kg)     Other studies reviewed: Additional studies/records reviewed today include: summarized above  ASSESSMENT AND PLAN:  1. CAD involving the native coronary arteries without angina: She is doing well from a cardiac perspective without any symptoms concerning for angina. Remains on ASA/Plavix given prior stroke.  Continue Bystolic and Crestor with addition of Zetia as outlined below.  Aggressive risk factor modification and secondary prevention including complete smoking cessation, optimal BP and lipid control, and weight loss.  Her lateral T wave inversion on EKG does appear new however she is completely asymptomatic.  Given this, we will defer ischemic evaluation at this time.  2. Paroxysmal SVT: Noted on 12-lead EKG from 04/2009. No further episodes on file.  No  palpitations.  Continue Bystolic.   3. Reported Afib: No evidence on Holter or EKGs in Epic following chart biopsy.  She denies any palpitations.  Not on long-term full dose oral anticoagulation without documented evidence of A. fib.  4. Prior stroke/TIA: No residual deficits. Remains on ASA/Plavix. Recommend optimal BP and lipid control with smoking cessation. Recent CBC with stable HGB. Follow up with PCP.   5. DM: Most recent A1c improved to 7.2. Per PCP.   6. HTN: Blood pressure  is well controlled today as above.  Continue Bystolic.  7. HLD: LDL from yesterday of 113 with a goal LDL of < 70.  Unfortunately, she frequently forgets to take her Crestor as she starts to "play war games."  Continue Crestor 40 mg daily.  Add Zetia 10 mg daily.  Recheck fasting lipid and liver function in 2 months.  If LDL remains above goal at that time, and if she has been more compliant with her statin therapy we should consider PCSK9 inhibitor versus bempedoic acid.  Though, compliance with oral medication would need to be documented first.  8. COPD with ongoing tobacco abuse: Stable, without acute exacerbation.  She continues to smoke 2 packs daily and is not interested in discussing quitting at this time.  Complete cessation is strongly recommended.  9. Chronic leukocytosis/elevated HGB: No obvious sign of infection. Per PCP.  Disposition: F/u with Dr. Rockey Situ or an APP in 12 months, sooner if needed.  Current medicines are reviewed at length with the patient today.  The patient did not have any concerns regarding medicines.  Signed, Christell Faith, PA-C 11/05/2018 9:13 AM     [address] [place], [place] [address] [phone]

## 2018-11-05 ENCOUNTER — Encounter: Payer: Self-pay | Admitting: Physician Assistant

## 2018-11-05 ENCOUNTER — Other Ambulatory Visit: Payer: Self-pay

## 2018-11-05 ENCOUNTER — Other Ambulatory Visit: Payer: Self-pay | Admitting: Family Medicine

## 2018-11-05 ENCOUNTER — Ambulatory Visit (INDEPENDENT_AMBULATORY_CARE_PROVIDER_SITE_OTHER): Payer: Medicaid Other | Admitting: Physician Assistant

## 2018-11-05 VITALS — BP 100/60 | HR 69 | Temp 98.3°F | Ht 60.5 in | Wt 179.0 lb

## 2018-11-05 DIAGNOSIS — I10 Essential (primary) hypertension: Secondary | ICD-10-CM | POA: Diagnosis not present

## 2018-11-05 DIAGNOSIS — I2510 Atherosclerotic heart disease of native coronary artery without angina pectoris: Secondary | ICD-10-CM | POA: Diagnosis not present

## 2018-11-05 DIAGNOSIS — E1165 Type 2 diabetes mellitus with hyperglycemia: Secondary | ICD-10-CM | POA: Diagnosis not present

## 2018-11-05 DIAGNOSIS — D72829 Elevated white blood cell count, unspecified: Secondary | ICD-10-CM

## 2018-11-05 DIAGNOSIS — I471 Supraventricular tachycardia: Secondary | ICD-10-CM | POA: Diagnosis not present

## 2018-11-05 DIAGNOSIS — Z720 Tobacco use: Secondary | ICD-10-CM

## 2018-11-05 DIAGNOSIS — E785 Hyperlipidemia, unspecified: Secondary | ICD-10-CM | POA: Diagnosis not present

## 2018-11-05 LAB — CBC WITH DIFFERENTIAL/PLATELET
Absolute Monocytes: 1178 cells/uL — ABNORMAL HIGH (ref 200–950)
Basophils Absolute: 107 cells/uL (ref 0–200)
Basophils Relative: 0.7 %
Eosinophils Absolute: 337 cells/uL (ref 15–500)
Eosinophils Relative: 2.2 %
HCT: 46.4 % — ABNORMAL HIGH (ref 35.0–45.0)
Hemoglobin: 15.9 g/dL — ABNORMAL HIGH (ref 11.7–15.5)
Lymphs Abs: 3947 cells/uL — ABNORMAL HIGH (ref 850–3900)
MCH: 33.1 pg — ABNORMAL HIGH (ref 27.0–33.0)
MCHC: 34.3 g/dL (ref 32.0–36.0)
MCV: 96.5 fL (ref 80.0–100.0)
MPV: 10.5 fL (ref 7.5–12.5)
Monocytes Relative: 7.7 %
Neutro Abs: 9731 cells/uL — ABNORMAL HIGH (ref 1500–7800)
Neutrophils Relative %: 63.6 %
Platelets: 291 10*3/uL (ref 140–400)
RBC: 4.81 10*6/uL (ref 3.80–5.10)
RDW: 12.2 % (ref 11.0–15.0)
Total Lymphocyte: 25.8 %
WBC: 15.3 10*3/uL — ABNORMAL HIGH (ref 3.8–10.8)

## 2018-11-05 LAB — COMPLETE METABOLIC PANEL WITH GFR
AG Ratio: 1.1 (calc) (ref 1.0–2.5)
ALT: 15 U/L (ref 6–29)
AST: 12 U/L (ref 10–35)
Albumin: 3.8 g/dL (ref 3.6–5.1)
Alkaline phosphatase (APISO): 111 U/L (ref 37–153)
BUN: 10 mg/dL (ref 7–25)
CO2: 28 mmol/L (ref 20–32)
Calcium: 9.4 mg/dL (ref 8.6–10.4)
Chloride: 102 mmol/L (ref 98–110)
Creat: 0.69 mg/dL (ref 0.50–0.99)
GFR, Est African American: 110 mL/min/{1.73_m2} (ref >=60)
GFR, Est Non African American: 95 mL/min/{1.73_m2} (ref >=60)
Globulin: 3.4 g/dL (calc) (ref 1.9–3.7)
Glucose, Bld: 174 mg/dL — ABNORMAL HIGH (ref 65–99)
Potassium: 4.3 mmol/L (ref 3.5–5.3)
Sodium: 137 mmol/L (ref 135–146)
Total Bilirubin: 0.3 mg/dL (ref 0.2–1.2)
Total Protein: 7.2 g/dL (ref 6.1–8.1)

## 2018-11-05 LAB — LIPID PANEL
Cholesterol: 177 mg/dL (ref <200)
HDL: 37 mg/dL — ABNORMAL LOW (ref >=50)
LDL Cholesterol (Calc): 113 mg/dL (calc) — ABNORMAL HIGH
Non-HDL Cholesterol (Calc): 140 mg/dL (calc) — ABNORMAL HIGH (ref <130)
Total CHOL/HDL Ratio: 4.8 (calc) (ref <5.0)
Triglycerides: 158 mg/dL — ABNORMAL HIGH (ref <150)

## 2018-11-05 LAB — HEMOGLOBIN A1C
Hgb A1c MFr Bld: 7.2 % of total Hgb — ABNORMAL HIGH (ref <5.7)
Mean Plasma Glucose: 160 (calc)
eAG (mmol/L): 8.9 (calc)

## 2018-11-05 LAB — MICROALBUMIN / CREATININE URINE RATIO
Creatinine, Urine: 131 mg/dL (ref 20–275)
Microalb Creat Ratio: 11 mcg/mg creat (ref <30)
Microalb, Ur: 1.4 mg/dL

## 2018-11-05 LAB — VITAMIN D 25 HYDROXY (VIT D DEFICIENCY, FRACTURES): Vit D, 25-Hydroxy: 9 ng/mL — ABNORMAL LOW (ref 30–100)

## 2018-11-05 LAB — VITAMIN B12: Vitamin B-12: 333 pg/mL (ref 200–1100)

## 2018-11-05 MED ORDER — ROSUVASTATIN CALCIUM 40 MG PO TABS: 40.0000 mg | ORAL_TABLET | Freq: Every day | ORAL | 3 refills | Status: AC

## 2018-11-05 MED ORDER — EZETIMIBE 10 MG PO TABS: 10.0000 mg | ORAL_TABLET | Freq: Every day | ORAL | 3 refills | Status: AC

## 2018-11-05 MED ORDER — CLOPIDOGREL BISULFATE 75 MG PO TABS: ORAL_TABLET | ORAL | 3 refills | Status: AC

## 2018-11-05 MED ORDER — NEBIVOLOL HCL 10 MG PO TABS: 10.0000 mg | ORAL_TABLET | Freq: Every day | ORAL | 3 refills | Status: DC

## 2018-11-05 NOTE — Patient Instructions (Addendum)
Medication Instructions:  Your physician has recommended you make the following change in your medication:   START Zetia 10mg  daily. An Rx has been sent to your pharmacy.  Your cardiac medications have been refilled today.  If you need a refill on your cardiac medications before your next appointment, please call your pharmacy.   Lab work: Your physician recommends that you return for a FASTING lipid profile and hepatic panel in 2 months.  Please have your labs drawn at [facility]. You do not need an appt. The hours are M-F 8am-4:30pm.  If you have labs (blood work) drawn today and your tests are completely normal, you will receive your results only by: Marland Kitchen MyChart Message (if you have MyChart) OR . A paper copy in the mail If you have any lab test that is abnormal or we need to change your treatment, we will call you to review the results.  Testing/Procedures: None ordered  Follow-Up: At [facility], you and your health needs are our priority.  As part of our continuing mission to provide you with exceptional heart care, we have created designated Provider Care Teams.  These Care Teams include your primary Cardiologist (physician) and Advanced Practice Providers (APPs -  Physician Assistants and Nurse Practitioners) who all work together to provide you with the care you need, when you need it. You will need a follow up appointment in 12 months.  Please call our office 2 months in advance to schedule this appointment.  You may see Ida Rogue, MD or one of the following Advanced Practice Providers on your designated Care Team:   Murray Hodgkins, NP Christell Faith, PA-C . Marrianne Mood, PA-C

## 2018-11-14 ENCOUNTER — Encounter: Payer: Self-pay | Admitting: Internal Medicine

## 2018-11-14 ENCOUNTER — Inpatient Hospital Stay: Payer: Medicaid Other | Attending: Internal Medicine | Admitting: Internal Medicine

## 2018-11-14 ENCOUNTER — Inpatient Hospital Stay: Payer: Medicaid Other

## 2018-11-14 ENCOUNTER — Other Ambulatory Visit: Payer: Self-pay

## 2018-11-14 DIAGNOSIS — E785 Hyperlipidemia, unspecified: Secondary | ICD-10-CM | POA: Insufficient documentation

## 2018-11-14 DIAGNOSIS — Z7984 Long term (current) use of oral hypoglycemic drugs: Secondary | ICD-10-CM | POA: Diagnosis not present

## 2018-11-14 DIAGNOSIS — I480 Paroxysmal atrial fibrillation: Secondary | ICD-10-CM | POA: Diagnosis not present

## 2018-11-14 DIAGNOSIS — D729 Disorder of white blood cells, unspecified: Secondary | ICD-10-CM

## 2018-11-14 DIAGNOSIS — I2510 Atherosclerotic heart disease of native coronary artery without angina pectoris: Secondary | ICD-10-CM

## 2018-11-14 DIAGNOSIS — D751 Secondary polycythemia: Secondary | ICD-10-CM | POA: Diagnosis not present

## 2018-11-14 DIAGNOSIS — Z7982 Long term (current) use of aspirin: Secondary | ICD-10-CM | POA: Insufficient documentation

## 2018-11-14 DIAGNOSIS — M1990 Unspecified osteoarthritis, unspecified site: Secondary | ICD-10-CM

## 2018-11-14 DIAGNOSIS — Z8673 Personal history of transient ischemic attack (TIA), and cerebral infarction without residual deficits: Secondary | ICD-10-CM | POA: Insufficient documentation

## 2018-11-14 DIAGNOSIS — G4700 Insomnia, unspecified: Secondary | ICD-10-CM | POA: Diagnosis not present

## 2018-11-14 DIAGNOSIS — D72829 Elevated white blood cell count, unspecified: Secondary | ICD-10-CM

## 2018-11-14 DIAGNOSIS — I10 Essential (primary) hypertension: Secondary | ICD-10-CM | POA: Insufficient documentation

## 2018-11-14 DIAGNOSIS — F419 Anxiety disorder, unspecified: Secondary | ICD-10-CM | POA: Insufficient documentation

## 2018-11-14 DIAGNOSIS — Z803 Family history of malignant neoplasm of breast: Secondary | ICD-10-CM

## 2018-11-14 DIAGNOSIS — J449 Chronic obstructive pulmonary disease, unspecified: Secondary | ICD-10-CM | POA: Insufficient documentation

## 2018-11-14 DIAGNOSIS — E119 Type 2 diabetes mellitus without complications: Secondary | ICD-10-CM | POA: Diagnosis not present

## 2018-11-14 DIAGNOSIS — Z79899 Other long term (current) drug therapy: Secondary | ICD-10-CM | POA: Diagnosis not present

## 2018-11-14 DIAGNOSIS — R531 Weakness: Secondary | ICD-10-CM

## 2018-11-14 DIAGNOSIS — R809 Proteinuria, unspecified: Secondary | ICD-10-CM | POA: Diagnosis not present

## 2018-11-14 DIAGNOSIS — F17210 Nicotine dependence, cigarettes, uncomplicated: Secondary | ICD-10-CM | POA: Diagnosis not present

## 2018-11-14 DIAGNOSIS — R51 Headache: Secondary | ICD-10-CM

## 2018-11-14 DIAGNOSIS — R5383 Other fatigue: Secondary | ICD-10-CM | POA: Diagnosis not present

## 2018-11-14 DIAGNOSIS — R634 Abnormal weight loss: Secondary | ICD-10-CM

## 2018-11-14 LAB — COMPREHENSIVE METABOLIC PANEL
ALT: 18 U/L (ref 0–44)
AST: 18 U/L (ref 15–41)
Albumin: 3.8 g/dL (ref 3.5–5.0)
Alkaline Phosphatase: 105 U/L (ref 38–126)
Anion gap: 10 (ref 5–15)
BUN: 10 mg/dL (ref 6–20)
CO2: 26 mmol/L (ref 22–32)
Calcium: 9.3 mg/dL (ref 8.9–10.3)
Chloride: 101 mmol/L (ref 98–111)
Creatinine, Ser: 0.76 mg/dL (ref 0.44–1.00)
GFR calc Af Amer: >60 mL/min (ref >60)
GFR calc non Af Amer: >60 mL/min (ref >60)
Glucose, Bld: 118 mg/dL — ABNORMAL HIGH (ref 70–99)
Potassium: 4.3 mmol/L (ref 3.5–5.1)
Sodium: 137 mmol/L (ref 135–145)
Total Bilirubin: 0.5 mg/dL (ref 0.3–1.2)
Total Protein: 8.1 g/dL (ref 6.5–8.1)

## 2018-11-14 LAB — CBC WITH DIFFERENTIAL/PLATELET
Abs Immature Granulocytes: 0.06 10*3/uL (ref 0.00–0.07)
Basophils Absolute: 0.1 10*3/uL (ref 0.0–0.1)
Basophils Relative: 1 %
Eosinophils Absolute: 0.4 10*3/uL (ref 0.0–0.5)
Eosinophils Relative: 2 %
HCT: 46.7 % — ABNORMAL HIGH (ref 36.0–46.0)
Hemoglobin: 15.9 g/dL — ABNORMAL HIGH (ref 12.0–15.0)
Immature Granulocytes: 0 %
Lymphocytes Relative: 37 %
Lymphs Abs: 5.9 10*3/uL — ABNORMAL HIGH (ref 0.7–4.0)
MCH: 33.2 pg (ref 26.0–34.0)
MCHC: 34 g/dL (ref 30.0–36.0)
MCV: 97.5 fL (ref 80.0–100.0)
Monocytes Absolute: 1.2 10*3/uL — ABNORMAL HIGH (ref 0.1–1.0)
Monocytes Relative: 8 %
Neutro Abs: 8.4 10*3/uL — ABNORMAL HIGH (ref 1.7–7.7)
Neutrophils Relative %: 52 %
Platelets: 296 10*3/uL (ref 150–400)
RBC: 4.79 MIL/uL (ref 3.87–5.11)
RDW: 13.6 % (ref 11.5–15.5)
Smear Review: ADEQUATE
WBC: 16 10*3/uL — ABNORMAL HIGH (ref 4.0–10.5)
nRBC: 0 % (ref 0.0–0.2)

## 2018-11-14 LAB — LACTATE DEHYDROGENASE: LDH: 120 U/L (ref 98–192)

## 2018-11-14 LAB — TECHNOLOGIST SMEAR REVIEW

## 2018-11-14 NOTE — Progress Notes (Signed)
[facility] CONSULT NOTE  Patient Care Team: Steele Sizer, MD as PCP - General Rockey Situ, Kathlene November, MD as PCP - Cardiology (Cardiology) Minna Merritts, MD as Consulting Physician (Cardiology) Vern Claude, [place] as Social Worker  CHIEF COMPLAINTS/PURPOSE OF CONSULTATION: Thrombocytosis/ Leucocytosis/ Erythrocytosis   HEMATOLOGY HISTORY  # LEUCOCYTOSIS-neutrophilia/lymphocytosis/ # ERTHROCYTOSIS chronic   #Chronic smoker  HISTORY OF PRESENTING ILLNESS:  Barbara Bowen 63 y.o.  female pleasant patient with a longstanding history of smoking was been referred to [place] for further evaluation of elevated white count/Hemoglobin which was incidentally found on blood work.   Patient notes to have elevated white count for many years.   Patient denies any current use of steroids.  Denies splenectomy.  Patient smokes about 2 packs of cigarettes a day.   Review of Systems  Constitutional: Positive for malaise/fatigue and weight loss (15-20 pounds/ 6 months). Negative for chills, diaphoresis and fever.  HENT: Negative for nosebleeds and sore throat.   Eyes: Negative for double vision.  Respiratory: Positive for cough and shortness of breath. Negative for hemoptysis, sputum production and wheezing.   Cardiovascular: Negative for chest pain, palpitations, orthopnea and leg swelling.  Gastrointestinal: Negative for abdominal pain, blood in stool, constipation, diarrhea, heartburn, melena, nausea and vomiting.  Genitourinary: Negative for dysuria, frequency and urgency.  Musculoskeletal: Positive for back pain and joint pain.  Skin: Negative.  Negative for itching and rash.  Neurological: Negative for dizziness, tingling, focal weakness, weakness and headaches.  Endo/Heme/Allergies: Does not bruise/bleed easily.  Psychiatric/Behavioral: Negative for depression. The patient has insomnia. The patient is not nervous/anxious.      MEDICAL HISTORY:  Past Medical History:   Diagnosis Date  . Anxiety   . COPD (chronic obstructive pulmonary disease) ([place])   . Coronary artery disease    a. 09/2007 s/p PCI/DES to prox RCA;  b. 2012 Cath: nonobs dzs;  c. 01/2014 PCI: 95d (3.0x23 Xience [place]), RPDA 80 (PTCA), EF 60%;  d. 11/2014 Cath: LM anomalous, LAD/D1/D2 min irregs, LCX small, min irregs, OM2 small, RCA 20p ISR, 10d ISR, EF 65%.  . DM type 2 (diabetes mellitus, type 2) ([place])   . Enthesopathy of hip region   . Essential hypertension   . Headache   . Hx-TIA (transient ischemic attack)   . Hyperlipidemia   . Insomnia, unspecified   . Leukocytosis, unspecified   . Lipoma of other skin and subcutaneous tissue   . OA (osteoarthritis)   . Palpitations   . Paroxysmal atrial fibrillation ([facility])    a. not appreciated on holter 09/2013;  b. CHA2DS2VASc = 5 (htn, DM, h/o TIA, vasc dzs, female) - not on [place].  Marland Kitchen Plantar fascial fibromatosis   . Proteinuria   . Stroke ([place])   . Tobacco use disorder    a. does not believe cigarettes contribute to her dyspnea/asthma.    Marland Kitchen Unspecified sleep apnea    a. Wears O2 via [place] @ [facility].  Marland Kitchen Unspecified tinnitus     SURGICAL HISTORY: Past Surgical History:  Procedure Laterality Date  . ABDOMINAL HYSTERECTOMY    . APPENDECTOMY    . BREAST SURGERY Right 06-09-2012   biopsy x2 9 oclock and 11 oclock  . CARDIAC CATHETERIZATION  2009   s/p right coronary artery drug-eluting stent  . CARDIAC CATHETERIZATION N/A 12/16/2014   Procedure: Left Heart Cath and Coronary Angiography;  Surgeon: Minna Merritts, MD;  Location: [facility];  Service: Cardiovascular;  Laterality: N/A;  . CORONARY  ANGIOPLASTY  2015  . exploratory thoracotomy    . MOLE REMOVAL      SOCIAL HISTORY: Social History   Socioeconomic History  . Marital status: Married    Spouse name: Not on file  . Number of children: Not on file  . Years of education: Not on file  . Highest education level: Not on file  Occupational History  . Occupation: unemployed     Employer: UNEMPLO  Social Needs  . Financial resource strain: Not on file  . Food insecurity    Worry: Not on file    Inability: Not on file  . Transportation needs    Medical: Not on file    Non-medical: Not on file  Tobacco Use  . Smoking status: Current Every Day Smoker    Packs/day: 2.00    Years: 38.00    Pack years: 76.00    Types: Cigarettes    Start date: 02/01/1978  . Smokeless tobacco: Never Used  Substance and Sexual Activity  . Alcohol use: No    Alcohol/week: 0.0 standard drinks  . Drug use: No  . Sexual activity: Not Currently  Lifestyle  . Physical activity    Days per week: 0 days    Minutes per session: 0 min  . Stress: Very much  Relationships  . Social connections    Talks on phone: More than three times a week    Gets together: Twice a week    Attends religious service: Never    Active member of club or organization: No    Attends meetings of clubs or organizations: Never    Relationship status: Married  . Intimate partner violence    Fear of current or ex partner: No    Emotionally abused: No    Physically abused: No    Forced sexual activity: No  Other Topics Concern  . Not on file  Social History Narrative   2pp/day x 30 years; No alcohol; [place]. insuarnce agent retd.   Does not work.  Does not routinely exercise.    FAMILY HISTORY: Family History  Problem Relation Age of Onset  . Heart disease Father        CAD  . Breast cancer Maternal Aunt   . Coronary artery disease Mother   . Diabetes type II Mother     ALLERGIES:  is allergic to amoxicillin-pot clavulanate; cefprozil; esomeprazole magnesium; montelukast sodium; omeprazole; penicillins; prednisone; spiriva [tiotropium bromide monohydrate]; and fluticasone-salmeterol.  MEDICATIONS:  Current Outpatient Medications  Medication Sig Dispense Refill  . albuterol (PROVENTIL HFA;VENTOLIN HFA) 108 (90 Base) MCG/ACT inhaler TAKE 2 PUFFS BY MOUTH EVERY 6 HOURS  AS NEEDED 8.5 Inhaler 0  . aspirin EC 81 MG tablet Take 1 tablet (81 mg total) by mouth daily. (Patient taking differently: Take 162 mg by mouth daily. ) 90 tablet 3  . BD ULTRA-FINE LANCETS lancets Use as instructed 100 each 12  . clopidogrel (PLAVIX) 75 MG tablet TAKE 1 TABLET (75 MG TOTAL) BY MOUTH DAILY. 90 tablet 3  . escitalopram (LEXAPRO) 10 MG tablet Take 1 tablet (10 mg total) by mouth daily. (Patient not taking: Reported on 11/05/2018) 30 tablet 0  . ezetimibe (ZETIA) 10 MG tablet Take 1 tablet (10 mg total) by mouth daily. 90 tablet 3  . glucose blood (ONE TOUCH ULTRA TEST) test strip Use as instructed 100 each 12  . hydrOXYzine (ATARAX/VISTARIL) 10 MG tablet Take 1 tablet (10 mg total) by mouth every 8 (eight) hours  as needed for anxiety. (Patient not taking: Reported on 11/05/2018) 30 tablet 0  . liraglutide (VICTOZA) 18 MG/3ML SOPN Inject into the skin once a week.     . metFORMIN (GLUCOPHAGE-XR) 500 MG 24 hr tablet Take 1,500 mg by mouth daily before breakfast.   6  . nebivolol (BYSTOLIC) 10 MG tablet Take 1 tablet (10 mg total) by mouth daily. 90 tablet 3  . nitroGLYCERIN (NITRO-DUR) 0.2 mg/hr patch Place 1 patch (0.2 mg total) onto the skin daily. 30 patch 6  . nitroGLYCERIN (NITROSTAT) 0.4 MG SL tablet Place 1 tablet (0.4 mg total) under the tongue every 5 (five) minutes as needed. 25 tablet 3  . OXYGEN Inhale 2.5 L into the lungs as needed.    . pregabalin (LYRICA) 50 MG capsule Take 1 capsule (50 mg total) by mouth daily for 14 days, THEN 1 capsule (50 mg total) 2 (two) times daily for 14 days, THEN 1 capsule (50 mg total) 3 (three) times daily for 14 days. 84 capsule 0  . rosuvastatin (CRESTOR) 40 MG tablet Take 1 tablet (40 mg total) by mouth daily. 90 tablet 3   No current facility-administered medications for this visit.      PHYSICAL EXAMINATION:   Vitals:   11/14/18 1507  BP: 133/76  Pulse: 83  Temp: 98.8 F (37.1 C)   Filed Weights   11/14/18 1507  Weight: 177  lb 6.4 oz (80.5 kg)    Physical Exam  Constitutional: She is oriented to person, place, and time and well-developed, well-nourished, and in no distress.  HENT:  Head: Normocephalic and atraumatic.  Mouth/Throat: Oropharynx is clear and moist. No oropharyngeal exudate.  Eyes: Pupils are equal, round, and reactive to light.  Neck: Normal range of motion. Neck supple.  Cardiovascular: Normal rate and regular rhythm.  Pulmonary/Chest: No respiratory distress. She has no wheezes.  Decreased breath sounds bilaterally  Abdominal: Soft. Bowel sounds are normal. She exhibits no distension and no mass. There is no abdominal tenderness. There is no rebound and no guarding.  Musculoskeletal: Normal range of motion.        General: No tenderness or edema.  Neurological: She is alert and oriented to person, place, and time.  Skin: Skin is warm.  Psychiatric: Affect normal.     LABORATORY DATA:  I have reviewed the data as listed Lab Results  Component Value Date   WBC 15.3 (H) 11/04/2018   HGB 15.9 (H) 11/04/2018   HCT 46.4 (H) 11/04/2018   MCV 96.5 11/04/2018   PLT 291 11/04/2018   Recent Labs    11/04/18 0846 11/14/18 1544  NA 137 137  K 4.3 4.3  CL 102 101  CO2 28 26  GLUCOSE 174* 118*  BUN 10 10  CREATININE 0.69 0.76  CALCIUM 9.4 9.3  GFRNONAA 95 >60  GFRAA 110 >60  PROT 7.2 8.1  ALBUMIN  --  3.8  AST 12 18  ALT 15 18  ALKPHOS  --  105  BILITOT 0.3 0.5     No results found.  ASSESSMENT & PLAN:   Neutrophilia #  Leukocytosis-neutrophilia/lymphocytosis-asymptomati/chronic.  Peak 15,000.  I would recommend repeating labs today CBC/review of peripheral smear LDH/peripheral blood flow cytometry. Check peripheral smear. Also discussed regarding bone marrow biopsy with the above workup is inconclusive; however I would prefer not to do a bone marrow unless absolutely needed.  #Weight loss abnormal more than 20 pounds the last 6 months.  Given history of smoking recommend  CT  scan chest and pelvis.  Question metformin.  # smoking- Discussed with the patient regarding the ill effects of smoking- including but not limited to cardiac lung and vascular diseases and malignancies. Counseled against smoking; patient-not interested in quitting.   # cancer screening-patient will need colonoscopy; CT scan as above.  Lung cancer screening moving forward.  Thank you Dr.Sowles for allowing me to participate in the care of your pleasant patient. Please do not hesitate to contact me with questions or concerns in the interim.  # DISPOSITION:  # labs today/ordered # follow up in 2 weeks- MD; no labs; CT C/A/P prior- Dr.B     Cammie Sickle, MD 11/14/2018 4:38 PM

## 2018-11-14 NOTE — Assessment & Plan Note (Addendum)
#  Leukocytosis-neutrophilia/lymphocytosis-asymptomati/chronic.  Peak 15,000.  I would recommend repeating labs today CBC/review of peripheral smear LDH/peripheral blood flow cytometry. Check peripheral smear. Also discussed regarding bone marrow biopsy with the above workup is inconclusive; however I would prefer not to do a bone marrow unless absolutely needed.  #Weight loss abnormal more than 20 pounds the last 6 months.  Given history of smoking recommend CT scan chest and pelvis.  Question metformin.  # smoking- Discussed with the patient regarding the ill effects of smoking- including but not limited to cardiac lung and vascular diseases and malignancies. Counseled against smoking; patient-not interested in quitting.   # cancer screening-patient will need colonoscopy; CT scan as above.  Lung cancer screening moving forward.  Thank you Dr.Sowles for allowing me to participate in the care of your pleasant patient. Please do not hesitate to contact me with questions or concerns in the interim.  # DISPOSITION:  # labs today/ordered # follow up in 2 weeks- MD; no labs; CT C/A/P prior- Dr.B

## 2018-11-17 LAB — COMP PANEL: LEUKEMIA/LYMPHOMA

## 2018-11-21 DIAGNOSIS — F4325 Adjustment disorder with mixed disturbance of emotions and conduct: Secondary | ICD-10-CM | POA: Diagnosis not present

## 2018-11-22 DIAGNOSIS — J449 Chronic obstructive pulmonary disease, unspecified: Secondary | ICD-10-CM | POA: Diagnosis not present

## 2018-11-25 ENCOUNTER — Telehealth: Payer: Self-pay

## 2018-11-25 ENCOUNTER — Ambulatory Visit: Admission: RE | Admit: 2018-11-25 | Payer: Medicaid Other | Source: Ambulatory Visit

## 2018-11-25 MED ORDER — NITROGLYCERIN 0.2 MG/HR TD PT24: 0.2000 mg | MEDICATED_PATCH | Freq: Every day | TRANSDERMAL | 6 refills | Status: AC

## 2018-11-25 MED ORDER — NITROGLYCERIN 0.4 MG SL SUBL: 0.4000 mg | SUBLINGUAL_TABLET | SUBLINGUAL | 3 refills | Status: AC | PRN

## 2018-11-25 NOTE — Telephone Encounter (Signed)
Okay to refill.  If patient is any more sublingual nitroglycerin recommend she schedule follow-up visit.

## 2018-11-25 NOTE — Addendum Note (Signed)
Addended by: Lamar Laundry on: 11/25/2018 09:21 AM   Modules accepted: Orders

## 2018-11-25 NOTE — Telephone Encounter (Addendum)
Please review for refill.  Nitro patch and tab both on med list. Request is for tabs and patch.

## 2018-11-25 NOTE — Telephone Encounter (Signed)
Spoke with the patient. Patient sts that she is doing ok from a cardiac standpoint and that she is just needing both Nitro Rx refilled. Pt adv to contact the office if increased sub Nitro use is needed. Pt verbalized understanding and voiced appreciation for the call.

## 2018-11-28 ENCOUNTER — Telehealth: Payer: Self-pay | Admitting: *Deleted

## 2018-11-28 ENCOUNTER — Ambulatory Visit: Payer: Medicaid Other | Admitting: Internal Medicine

## 2018-11-28 NOTE — Telephone Encounter (Signed)
-----   Message from Elliot Gault sent at 11/28/2018  2:31 PM EDT ----- Regarding: RE: Cancel CT for tomorrow CT's have been approved. She can be re-scheduled.  Thank you,  Brandi ----- Message ----- From: Sabino Gasser, RN Sent: 11/28/2018   2:08 PM EDT To: Brandi Harvell, [place], # Subject: RE: Cancel CT for tomorrow                     Any updates? ----- Message ----- From: Elliot Gault Sent: 11/24/2018   3:43 PM EDT To: Ronnell [place], RN, # Subject: Cancel CT for tomorrow                         The auth for CT CAP is still pending. We will need to cancel for tomorrow and rs once we have the approval.  Thank you,  Velna Hatchet

## 2018-11-28 NOTE — Telephone Encounter (Signed)
Request sent to scheduling team to r/s pt's ct scan.

## 2018-12-01 DIAGNOSIS — J449 Chronic obstructive pulmonary disease, unspecified: Secondary | ICD-10-CM | POA: Diagnosis not present

## 2018-12-05 ENCOUNTER — Ambulatory Visit: Payer: Medicaid Other

## 2018-12-08 ENCOUNTER — Ambulatory Visit
Admission: RE | Admit: 2018-12-08 | Discharge: 2018-12-08 | Disposition: A | Discharge status: Home / Self Care | Payer: Medicaid Other | Source: Ambulatory Visit | Attending: Internal Medicine | Admitting: Internal Medicine

## 2018-12-08 ENCOUNTER — Other Ambulatory Visit: Payer: Self-pay

## 2018-12-08 DIAGNOSIS — R911 Solitary pulmonary nodule: Secondary | ICD-10-CM | POA: Diagnosis not present

## 2018-12-08 DIAGNOSIS — E278 Other specified disorders of adrenal gland: Secondary | ICD-10-CM | POA: Diagnosis not present

## 2018-12-08 DIAGNOSIS — I77811 Abdominal aortic ectasia: Secondary | ICD-10-CM | POA: Diagnosis not present

## 2018-12-08 DIAGNOSIS — D729 Disorder of white blood cells, unspecified: Secondary | ICD-10-CM | POA: Insufficient documentation

## 2018-12-08 DIAGNOSIS — J439 Emphysema, unspecified: Secondary | ICD-10-CM | POA: Diagnosis not present

## 2018-12-08 DIAGNOSIS — R918 Other nonspecific abnormal finding of lung field: Secondary | ICD-10-CM | POA: Diagnosis not present

## 2018-12-08 DIAGNOSIS — I700 Atherosclerosis of aorta: Secondary | ICD-10-CM | POA: Diagnosis not present

## 2018-12-08 DIAGNOSIS — R634 Abnormal weight loss: Secondary | ICD-10-CM | POA: Diagnosis not present

## 2018-12-08 HISTORY — DX: Unspecified asthma, uncomplicated: J45.909

## 2018-12-08 MED ORDER — IOHEXOL 300 MG/ML  SOLN
100.0000 mL | Freq: Once | INTRAMUSCULAR | Status: AC | PRN
  Administered 2018-12-08: 100 mL via INTRAVENOUS

## 2018-12-09 ENCOUNTER — Inpatient Hospital Stay: Payer: Medicaid Other | Attending: Internal Medicine | Admitting: Internal Medicine

## 2018-12-16 ENCOUNTER — Other Ambulatory Visit: Payer: Self-pay

## 2018-12-16 ENCOUNTER — Encounter: Payer: Self-pay | Admitting: Family Medicine

## 2018-12-16 ENCOUNTER — Ambulatory Visit: Payer: Medicaid Other | Admitting: Family Medicine

## 2018-12-16 VITALS — BP 108/64 | HR 86 | Temp 97.1°F | Resp 16 | Ht 61.0 in | Wt 185.4 lb

## 2018-12-16 DIAGNOSIS — Z23 Encounter for immunization: Secondary | ICD-10-CM

## 2018-12-16 DIAGNOSIS — E538 Deficiency of other specified B group vitamins: Secondary | ICD-10-CM | POA: Diagnosis not present

## 2018-12-16 DIAGNOSIS — I77819 Aortic ectasia, unspecified site: Secondary | ICD-10-CM | POA: Diagnosis not present

## 2018-12-16 DIAGNOSIS — Z1211 Encounter for screening for malignant neoplasm of colon: Secondary | ICD-10-CM

## 2018-12-16 DIAGNOSIS — R454 Irritability and anger: Secondary | ICD-10-CM | POA: Diagnosis not present

## 2018-12-16 DIAGNOSIS — I700 Atherosclerosis of aorta: Secondary | ICD-10-CM | POA: Diagnosis not present

## 2018-12-16 MED ORDER — CYANOCOBALAMIN 1000 MCG/ML IJ SOLN
1000.0000 ug | Freq: Once | INTRAMUSCULAR | Status: AC
  Administered 2018-12-16: 1000 ug via INTRAMUSCULAR

## 2018-12-16 MED ORDER — BUSPIRONE HCL 5 MG PO TABS: 5.0000 mg | ORAL_TABLET | Freq: Three times a day (TID) | ORAL | 0 refills | Status: AC

## 2018-12-16 NOTE — Progress Notes (Signed)
Name: Barbara Bowen   MRN: [id number]    DOB: 03-29-1956   Date:12/16/2018       Progress Note  Subjective  Chief Complaint  Chief Complaint  Patient presents with  . Follow-up  . Anger    Gave it a shot taking Lexapro-but didn't like she couldn't deal with things    HPI  Anger: she went to [facility] for assessment about one month ago and was advised to have anger management classes, but she states she feels like she needs to have an outlet to let it out. She tried Lexapro that I prescribed in July, she took for 4 days but it made her feel like a zombie , very slow and could not watch her grandchildren while taking the medication. She states she is still under stress, she states she loses her temper specially when she takes her husband to see his doctor's, she feels like they are not taking care of him because of COVID-19 and he is falling through the cracks. She is afraid that they will dismiss her husband because of her temper, she is also not allowed to go with him to his pcp.. She does not want to go back to [facility], discussed buspar and she is willing to try it.   B12 deficiency: she would like to get B12 injection to help with her energy level  Morbid obesity: based on BMI above 35 with co-morbidities such as DM, dyslipidemia, CAD, explained importance of weight loss  Abnormal CT abdomen: ordered by hematologist, that she went to see for evaluation of elevated HCT and leucocytosis. Showed atherosclerosis of aorta and aorta ectasy. Advised to continue statin therapy , taking aspirin and to repeat US of aorta for monitoring of possible development of aneurysm.    Patient Active Problem List   Diagnosis Date Noted  . Abnormal weight loss 11/14/2018  . Cervical radiculitis 02/14/2018  . Vitamin D deficiency, unspecified 05/07/2017  . Hyperlipidemia due to type 2 diabetes mellitus ([place]) 05/07/2017  . Dependence on nocturnal oxygen therapy 04/12/2017  . Dyslipidemia associated with type 2  diabetes mellitus ([place]) 06/28/2016  . Spinal stenosis 10/15/2015  . Bilateral tinnitus 08/23/2015  . B12 deficiency 03/31/2015  . OSA (obstructive sleep apnea) 03/31/2015  . 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 Intertrigo 02/23/2015  . Insomnia 12/29/2014  . Migraine without aura and without status migrainosus, not intractable 12/29/2014  . Neutrophilia 12/24/2014  . Macrocytic 12/24/2014  . Essential hypertension   . Paroxysmal atrial fibrillation (HCC)   . Diabetes mellitus with renal manifestations, uncontrolled ([place])   . History of MI (myocardial infarction) 10/30/2013  . Benign neoplasm of breast 09/12/2012  . COPD (chronic obstructive pulmonary disease) ([place]) 09/09/2012  . Smoking 03/27/2011  . Hyperlipidemia 10/28/2009  . CAD (coronary artery disease), native coronary artery 10/28/2009  . Shortness of breath 10/28/2009  . Angina pectoris ([place]) 10/28/2009    Past Surgical History:  Procedure Laterality Date  . ABDOMINAL HYSTERECTOMY    . APPENDECTOMY    . BREAST SURGERY Right 06-09-2012   biopsy x2 9 oclock and 11 oclock  . CARDIAC CATHETERIZATION  2009   s/p right coronary artery drug-eluting stent  . CARDIAC CATHETERIZATION N/A 12/16/2014   Procedure: Left Heart Cath and Coronary Angiography;  Surgeon: Minna Merritts, MD;  Location: [facility];  Service: Cardiovascular;  Laterality: N/A;  . CORONARY ANGIOPLASTY  2015  . exploratory thoracotomy    . MOLE REMOVAL  Family History  Problem Relation Age of Onset  . Heart disease Father        CAD  . Breast cancer Maternal Aunt   . Coronary artery disease Mother   . Diabetes type II Mother     Social History   Socioeconomic History  . Marital status: Married    Spouse name: Merrilee Seashore  . Number of children: Not on file  . Years of education: Not on file  . Highest education level: Not on file  Occupational History  . Occupation: unemployed    Employer:  [employer]EMPL[employer]  Social Needs  . Financial resource strain: Not hard at all  . Food insecurity    Worry: Never true    Inability: Never true  . Transportation needs    Medical: No    Non-medical: No  Tobacco Use  . Smoking status: Current Every Day Smoker    Packs/day: 2.00    Years: 38.00    Pack years: 76.00    Types: Cigarettes    Start date: 02/01/1978  . Smokeless tobacco: Never Used  Substance and Sexual Activity  . Alcohol use: No    Alcohol/week: 0.0 standard drinks  . Drug use: No  . Sexual activity: Not Currently  Lifestyle  . Physical activity    Days per week: 0 days    Minutes per session: 0 min  . Stress: Very much  Relationships  . Social connections    Talks on phone: More than three times a week    Gets together: Twice a week    Attends religious service: Never    Active member of club or organization: No    Attends meetings of clubs or organizations: Never    Relationship status: Married  . Intimate partner violence    Fear of current or ex partner: No    Emotionally abused: No    Physically abused: No    Forced sexual activity: No  Other Topics Concern  . Not on file  Social History Narrative    Lives in [place] with husband. He has prostate cancer and some other medical problems   Mother lives in [place] and her son helps her out now      Current Outpatient Medications:  .  albuterol (PROVENTIL HFA;VENTOLIN HFA) 108 (90 Base) MCG/ACT inhaler, TAKE 2 PUFFS BY MOUTH EVERY 6 HOURS AS NEEDED, Disp: 8.5 Inhaler, Rfl: 0 .  aspirin EC 81 MG tablet, Take 1 tablet (81 mg total) by mouth daily. (Patient taking differently: Take 162 mg by mouth daily. ), Disp: 90 tablet, Rfl: 3 .  BD ULTRA-FINE LANCETS lancets, Use as instructed, Disp: 100 each, Rfl: 12 .  clopidogrel (PLAVIX) 75 MG tablet, TAKE 1 TABLET (75 MG TOTAL) BY MOUTH DAILY., Disp: 90 tablet, Rfl: 3 .  ezetimibe (ZETIA) 10 MG tablet, Take 1 tablet (10 mg total) by mouth daily., Disp: 90 tablet, Rfl:  3 .  glucose blood (ONE TOUCH ULTRA TEST) test strip, Use as instructed, Disp: 100 each, Rfl: 12 .  hydrOXYzine (ATARAX/VISTARIL) 10 MG tablet, Take 1 tablet (10 mg total) by mouth every 8 (eight) hours as needed for anxiety., Disp: 30 tablet, Rfl: 0 .  metFORMIN (GLUCOPHAGE-XR) 500 MG 24 hr tablet, Take 1,500 mg by mouth daily before breakfast. , Disp: , Rfl: 6 .  nebivolol (BYSTOLIC) 10 MG tablet, Take 1 tablet (10 mg total) by mouth daily., Disp: 90 tablet, Rfl: 3 .  nitroGLYCERIN (NITRO-DUR) 0.2 mg/hr patch, Place 1 patch (0.2 mg  total) onto the skin daily., Disp: 30 patch, Rfl: 6 .  nitroGLYCERIN (NITROSTAT) 0.4 MG SL tablet, Place 1 tablet (0.4 mg total) under the tongue every 5 (five) minutes as needed., Disp: 25 tablet, Rfl: 3 .  OXYGEN, Inhale 2.5 L into the lungs as needed., Disp: , Rfl:  .  rosuvastatin (CRESTOR) 40 MG tablet, Take 1 tablet (40 mg total) by mouth daily., Disp: 90 tablet, Rfl: 3 .  busPIRone (BUSPAR) 5 MG tablet, Take 1 tablet (5 mg total) by mouth 3 (three) times daily., Disp: 90 tablet, Rfl: 0 .  pregabalin (LYRICA) 50 MG capsule, Take 1 capsule (50 mg total) by mouth daily for 14 days, THEN 1 capsule (50 mg total) 2 (two) times daily for 14 days, THEN 1 capsule (50 mg total) 3 (three) times daily for 14 days., Disp: 84 capsule, Rfl: 0  Allergies  Allergen Reactions  . Amoxicillin-Pot Clavulanate     Vomiting   . Cefprozil     Vomiting   . Esomeprazole Magnesium     unknown  . Montelukast Sodium     unknown  . Omeprazole     unknown  . Penicillins     Vomiting blood  . Prednisone     Rapid heart beat & difficulty breathing  . Spiriva [Tiotropium Bromide Monohydrate]     HA  . Fluticasone-Salmeterol Palpitations    Also happened with Symbicort Also happened with Symbicort    I personally reviewed active problem list, medication list, allergies, family history, social history, health maintenance with the patient/caregiver  today.   ROS  Constitutional: Negative for fever or weight change.  Respiratory: positive  for cough and shortness of breath.   Cardiovascular: Negative for chest pain or palpitations.  Gastrointestinal: Negative for abdominal pain, no bowel changes.  Musculoskeletal: Negative for gait problem or joint swelling.  Skin: Negative for rash.  Neurological: Negative for dizziness or headache.  No other specific complaints in a complete review of systems (except as listed in HPI above).  Objective  Vitals:   12/16/18 1131  BP: 108/64  Pulse: 86  Resp: 16  Temp: (!) 97.1 F (36.2 C)  TempSrc: Temporal  SpO2: 99%  Weight: 185 lb 6.4 oz (84.1 kg)  Height: '5\' 1"'$  (1.549 m)    Body mass index is 35.03 kg/m.  Physical Exam  Constitutional: Patient appears well-developed and well-nourished. Obese  No distress.  HEENT: head atraumatic, normocephalic, pupils equal and reactive to light Cardiovascular: Normal rate, regular rhythm and normal heart sounds.  No murmur heard. No BLE edema. Pulmonary/Chest: Effort normal and breath sounds normal. No respiratory distress. Abdominal: Soft.  There is no tenderness. Psychiatric: Patient has a normal mood and affect. behavior is normal. Judgment and thought content normal.  Recent Results (from the past 2160 hour(s))  Hemoglobin A1c     Status: Abnormal   Collection Time: 11/04/18  8:46 AM  Result Value Ref Range   Hgb A1c MFr Bld 7.2 (H) <5.7 % of total Hgb    Comment: For someone without known diabetes, a hemoglobin A1c value of 6.5% or greater indicates that they may have  diabetes and this should be confirmed with a follow-up  test. . For someone with known diabetes, a value <7% indicates  that their diabetes is well controlled and a value  greater than or equal to 7% indicates suboptimal  control. A1c targets should be individualized based on  duration of diabetes, age, comorbid conditions, and  other considerations. Marland Kitchen  Currently,  no consensus exists regarding use of hemoglobin A1c for diagnosis of diabetes for children. .    Mean Plasma Glucose 160 (calc)   eAG (mmol/L) 8.9 (calc)  Microalbumin / creatinine urine ratio     Status: None   Collection Time: 11/04/18  8:46 AM  Result Value Ref Range   Creatinine, Urine 131 20 - 275 mg/dL   Microalb, Ur 1.4 mg/dL    Comment: Reference Range Not established    Microalb Creat Ratio 11 <30 mcg/mg creat    Comment: . The ADA defines abnormalities in albumin excretion as follows: Marland Kitchen Category         Result (mcg/mg creatinine) . Normal                    <30 Microalbuminuria         30-299  Clinical albuminuria   > OR = 300 . The ADA recommends that at least two of three specimens collected within a 3-6 month period be abnormal before considering a patient to be within a diagnostic category.   Lipid panel     Status: Abnormal   Collection Time: 11/04/18  8:46 AM  Result Value Ref Range   Cholesterol 177 <200 mg/dL   HDL 37 (L) > OR = 50 mg/dL   Triglycerides 158 (H) <150 mg/dL   LDL Cholesterol (Calc) 113 (H) mg/dL (calc)    Comment: Reference range: <100 . Desirable range <100 mg/dL for primary prevention;   <70 mg/dL for patients with CHD or diabetic patients  with > or = 2 CHD risk factors. Marland Kitchen LDL-C is now calculated using the Martin-Hopkins  calculation, which is a validated novel method providing  better accuracy than the Friedewald equation in the  estimation of LDL-C.  Cresenciano Genre et al. Annamaria Helling. 1594;585(92): 2061-2068  (http://education.[facility].com/faq/FAQ164)    Total CHOL/HDL Ratio 4.8 <5.0 (calc)   Non-HDL Cholesterol (Calc) 140 (H) <130 mg/dL (calc)    Comment: For patients with diabetes plus 1 major ASCVD risk  factor, treating to a non-HDL-C goal of <100 mg/dL  (LDL-C of <70 mg/dL) is considered a therapeutic  option.   VITAMIN D 25 Hydroxy (Vit-D Deficiency, Fractures)     Status: Abnormal   Collection Time: 11/04/18  8:46 AM   Result Value Ref Range   Vit D, 25-Hydroxy 9 (L) 30 - 100 ng/mL    Comment: Vitamin D Status         25-OH Vitamin D: . Deficiency:                    <20 ng/mL Insufficiency:             20 - 29 ng/mL Optimal:                 > or = 30 ng/mL . For 25-OH Vitamin D testing on patients on  D2-supplementation and patients for whom quantitation  of D2 and D3 fractions is required, the QuestAssureD(TM) 25-OH VIT D, (D2,D3), LC/MS/MS is recommended: order  code [phone] (patients >46yr). See Note 1 . Note 1 . For additional information, please refer to  http://education.[facility].com/faq/FAQ199  (This link is being provided for informational/ educational purposes only.)   COMPLETE METABOLIC PANEL WITH GFR     Status: Abnormal   Collection Time: 11/04/18  8:46 AM  Result Value Ref Range   Glucose, Bld 174 (H) 65 - 99 mg/dL    Comment: .  Fasting reference interval . For someone without known diabetes, a glucose value >125 mg/dL indicates that they may have diabetes and this should be confirmed with a follow-up test. .    BUN 10 7 - 25 mg/dL   Creat 0.69 0.50 - 0.99 mg/dL    Comment: For patients >38 years of age, the reference limit for Creatinine is approximately 13% higher for people identified as African-American. .    GFR, Est Non African American 95 > OR = 60 mL/min/1.29m   GFR, Est African American 110 > OR = 60 mL/min/1.751m  BUN/Creatinine Ratio NOT APPLICABLE 6 - 22 (calc)   Sodium 137 135 - 146 mmol/L   Potassium 4.3 3.5 - 5.3 mmol/L   Chloride 102 98 - 110 mmol/L   CO2 28 20 - 32 mmol/L   Calcium 9.4 8.6 - 10.4 mg/dL   Total Protein 7.2 6.1 - 8.1 g/dL   Albumin 3.8 3.6 - 5.1 g/dL   Globulin 3.4 1.9 - 3.7 g/dL (calc)   AG Ratio 1.1 1.0 - 2.5 (calc)   Total Bilirubin 0.3 0.2 - 1.2 mg/dL   Alkaline phosphatase (APISO) 111 37 - 153 U/L   AST 12 10 - 35 U/L   ALT 15 6 - 29 U/L  CBC with Differential/Platelet     Status: Abnormal   Collection  Time: 11/04/18  8:46 AM  Result Value Ref Range   WBC 15.3 (H) 3.8 - 10.8 Thousand/uL   RBC 4.81 3.80 - 5.10 Million/uL   Hemoglobin 15.9 (H) 11.7 - 15.5 g/dL   HCT 46.4 (H) 35.0 - 45.0 %   MCV 96.5 80.0 - 100.0 fL   MCH 33.1 (H) 27.0 - 33.0 pg   MCHC 34.3 32.0 - 36.0 g/dL   RDW 12.2 11.0 - 15.0 %   Platelets 291 140 - 400 Thousand/uL   MPV 10.5 7.5 - 12.5 fL   Neutro Abs 9,731 (H) 1,500 - 7,800 cells/uL   Lymphs Abs 3,947 (H) 850 - 3,900 cells/uL   Absolute Monocytes 1,178 (H) 200 - 950 cells/uL   Eosinophils Absolute 337 15 - 500 cells/uL   Basophils Absolute 107 0 - 200 cells/uL   Neutrophils Relative % 63.6 %   Total Lymphocyte 25.8 %   Monocytes Relative 7.7 %   Eosinophils Relative 2.2 %   Basophils Relative 0.7 %  B12     Status: None   Collection Time: 11/04/18  8:46 AM  Result Value Ref Range   Vitamin B-12 333 200 - 1,100 pg/mL    Comment: . Please Note: Although the reference range for vitamin B12 is [phone] pg/mL, it has been reported that between 5 and 10% of patients with values between 200 and 400 pg/mL may experience neuropsychiatric and hematologic abnormalities due to occult B12 deficiency; less than 1% of patients with values above 400 pg/mL will have symptoms. .   Technologist smear review     Status: None   Collection Time: 11/14/18  3:42 PM  Result Value Ref Range   Tech Review MORPHOLOGY UNREMARKABLE     Comment: REFER TO CBC RESULTS [facility]formed [facility] [place] [id number] Flow cytometry panel-leukemia/lymphoma work-up     Status: None   Collection Time: 11/14/18  3:44 PM  Result Value Ref Range   PATH INTERP XXX-IMP Comment     Comment: (NOTE) No significant immunophenotypic abnormality detected Lymphocytosis See Comment    ANNOTATION COMMENT IMP Comment  Comment: (NOTE) The lymphocytosis is due to an increase in CD4+ and CD8+ T-cells and B-cells. Increases in multiple lymphocyte subsets suggests a  reactive process. Clinical correlation is recommended.    CLINICAL INFO Comment     Comment: (NOTE) Accompanying CBC dated 11/14/2018 shows a neutrophilia, lymphocytosis and monocytosis.  WBC 16.0, Ly% 37, Mono% 8,  Neu 8.5, Lym 5.9, Mon 1.2.    Specimen Type Comment     Comment: Peripheral blood   ASSESSMENT OF LEUKOCYTES Comment     Comment: (NOTE) No monoclonal B cell population is detected. kappa:lambda ratio 2.1 There is no loss of, or aberrant expression of, the pan T cell antigens to suggest a neoplastic T cell process. CD4:CD8 ratio 2.5 No circulating blasts are detected. There is no immunophenotypic  evidence of abnormal myeloid maturation. Monocytes show no phenotypic aberrancies. Analysis of the leukocyte population shows: granulocytes 63%, monocytes 6%, lymphocytes 31%, blasts <0.1%, B cells 6%, T cells 23%, NK cells 2%.    % Viable Cells Comment     Comment: 97%   ANALYSIS AND GATING STRATEGY Comment     Comment: 8 color analysis with CD45/SSC gating   IMMUNOPHENOTYPING STUDY Comment     Comment: (NOTE) CD2       Normal         CD3       Normal CD4       Normal         CD5       Normal CD7       Normal         CD8       Normal CD10      Normal         CD11b     Normal CD13      Normal         CD14      Normal CD16      Normal         CD19      Normal CD20      Normal         CD33      Normal CD34      Normal         CD38      Normal CD45      Normal         CD56      Normal CD57      Normal         CD117     Normal HLA-DR    Normal         KAPPA     Normal LAMBDA    Normal         CD64      Normal CD11c     Normal    PATHOLOGIST NAME Comment     Comment: Gaston Islam, M.D.   COMMENT: Comment     Comment: (NOTE) Each antibody in this assay was utilized to assess for potential abnormalities of studied cell populations or to characterize identified abnormalities. This test was developed and its performance characteristics determined by [facility].  It  has not been cleared or approved by the U.S. Food and Drug Administration. The FDA has determined that such clearance or approval is not necessary. This test is used for clinical purposes.  It should not be regarded as investigational or for research. Performed At: -[facility] [address] [place], [place] [id number] Katina Degree MDPhD [id number] Performed  At: [facility] [address] [place], [place] [id number] Katina Degree MDPhD [id number]   Comprehensive metabolic panel     Status: Abnormal   Collection Time: 11/14/18  3:44 PM  Result Value Ref Range   Sodium 137 135 - 145 mmol/L   Potassium 4.3 3.5 - 5.1 mmol/L   Chloride 101 98 - 111 mmol/L   CO2 26 22 - 32 mmol/L   Glucose, Bld 118 (H) 70 - 99 mg/dL   BUN 10 6 - 20 mg/dL   Creatinine, Ser 0.76 0.44 - 1.00 mg/dL   Calcium 9.3 8.9 - 10.3 mg/dL   Total Protein 8.1 6.5 - 8.1 g/dL   Albumin 3.8 3.5 - 5.0 g/dL   AST 18 15 - 41 U/L   ALT 18 0 - 44 U/L   Alkaline Phosphatase 105 38 - 126 U/L   Total Bilirubin 0.5 0.3 - 1.2 mg/dL   GFR calc non Af Amer >60 >60 mL/min   GFR calc Af Amer >60 >60 mL/min   Anion gap 10 5 - 15    Comment: Performed at [facility], [address] [place], [place] [address]  Lactate dehydrogenase     Status: None   Collection Time: 11/14/18  3:44 PM  Result Value Ref Range   LDH 120 98 - 192 U/L    Comment: Performed at [facility], [address] [place], [place] [address]  CBC with Differential/Platelet     Status: Abnormal   Collection Time: 11/14/18  3:44 PM  Result Value Ref Range   WBC 16.0 (H) 4.0 - 10.5 K/uL   RBC 4.79 3.87 - 5.11 MIL/uL   Hemoglobin 15.9 (H) 12.0 - 15.0 g/dL   HCT 46.7 (H) 36.0 - 46.0 %   MCV 97.5 80.0 - 100.0 fL   MCH 33.2 26.0 - 34.0 pg   MCHC 34.0 30.0 - 36.0 g/dL   RDW 13.6 11.5 - 15.5 %   Platelets 296 150 - 400 K/uL   nRBC 0.0 0.0 - 0.2 %   Neutrophils Relative % 52 %   Neutro Abs 8.4 (H) 1.7 - 7.7 K/uL   Lymphocytes  Relative 37 %   Lymphs Abs 5.9 (H) 0.7 - 4.0 K/uL   Monocytes Relative 8 %   Monocytes Absolute 1.2 (H) 0.1 - 1.0 K/uL   Eosinophils Relative 2 %   Eosinophils Absolute 0.4 0.0 - 0.5 K/uL   Basophils Relative 1 %   Basophils Absolute 0.1 0.0 - 0.1 K/uL   WBC Morphology UNREMARKABLE. DIFF CONFIRMED BY MANUAL    RBC Morphology UNREMARKABLE    Smear Review PLATELETS APPEAR ADEQUATE     Comment: PLATELETS VARY IN SIZE WITH OCC LARGE BODY PLATELET NOTED   Immature Granulocytes 0 %   Abs Immature Granulocytes 0.06 0.00 - 0.07 K/uL    Comment: Performed at [facility], [address], [place], [place] [address]    Diabetic Foot Exam: Diabetic Foot Exam - Simple   Simple Foot Form Diabetic Foot exam was performed with the following findings: Yes 12/16/2018 12:26 PM  Visual Inspection See comments: Yes Sensation Testing Intact to touch and monofilament testing bilaterally: Yes Pulse Check Posterior Tibialis and Dorsalis pulse intact bilaterally: Yes Comments Thick toenails       PHQ2/9: Depression screen [facility] 2/9 12/16/2018 11/03/2018 10/20/2018 10/16/2018 02/24/2018  Decreased Interest 3 0 0 0 0  Down, Depressed, Hopeless 3 3 0 0 0  PHQ - 2 Score 6 3  0 0 0  Altered sleeping 3 3 - 0 -  Tired, decreased energy 3 3 - 0 -  Change in appetite 1 3 - 0 -  Feeling bad or failure about yourself  0 0 - 0 -  Trouble concentrating 0 0 - 0 -  Moving slowly or fidgety/restless 0 0 - 0 -  Suicidal thoughts 0 0 - 0 -  PHQ-9 Score 13 12 - 0 -  Difficult doing work/chores Extremely dIfficult Extremely dIfficult - Not difficult at all -    phq 9 is positive   Fall Risk: Fall Risk  12/16/2018 11/03/2018 10/16/2018 10/16/2018 02/24/2018  Falls in the past year? 1 1 0 0 0  Comment - - - - -  Number falls in past yr: 1 1 0 0 -  Injury with Fall? 0 0 0 0 -  Comment - - - - -  Risk Factor Category  - - - - -  Risk for fall due to : - - - - -  Risk for fall due to: Comment - - - - -  Follow up  - - - - -      Functional Status Survey: Is the patient deaf or have difficulty hearing?: No Does the patient have difficulty seeing, even when wearing glasses/contacts?: Yes Does the patient have difficulty concentrating, remembering, or making decisions?: No Does the patient have difficulty walking or climbing stairs?: No Does the patient have difficulty dressing or bathing?: No Does the patient have difficulty doing errands alone such as visiting a doctor's office or shopping?: No   Assessment & Plan  1. Irritability and anger  - busPIRone (BUSPAR) 5 MG tablet; Take 1 tablet (5 mg total) by mouth 3 (three) times daily.  Dispense: 90 tablet; Refill: 0  2. Needs flu shot  - Flu Vaccine MDCK QUAD PF  3. Colon cancer screening  - Ambulatory referral to Gastroenterology  4. B12 deficiency  - cyanocobalamin ((VITAMIN B-12)) injection 1,000 mcg   5. Atherosclerosis of aorta (HCC)  On statin and aspirin   6. 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 Morbid obesity ([place])  Discussed with the patient the risk posed by an increased BMI. Discussed importance of portion control, calorie counting and at least 150 minutes of physical activity weekly. Avoid sweet beverages and drink more water. Eat at least 6 servings of fruit and vegetables daily

## 2018-12-23 DIAGNOSIS — J449 Chronic obstructive pulmonary disease, unspecified: Secondary | ICD-10-CM | POA: Diagnosis not present

## 2018-12-27 ENCOUNTER — Other Ambulatory Visit: Payer: Self-pay | Admitting: Family Medicine

## 2018-12-27 DIAGNOSIS — F329 Major depressive disorder, single episode, unspecified: Secondary | ICD-10-CM

## 2018-12-31 ENCOUNTER — Telehealth: Payer: Self-pay

## 2018-12-31 NOTE — Telephone Encounter (Signed)
Copied from [place] [phone]. Topic: General - Other >> Dec 31, 2018 11:31 AM Yvette Rack wrote: Reason for CRM: Pt stated there was a recall on the metFORMIN (GLUCOPHAGE-XR) 500 MG 24 hr tablet and she would like to speak with a nurse to discuss options. Pt requests call back. Cb# [id number]

## 2018-12-31 NOTE — Telephone Encounter (Signed)
Patient notified and states she already called her pharmacy and it was not the affected batch of medication.

## 2019-01-01 DIAGNOSIS — J449 Chronic obstructive pulmonary disease, unspecified: Secondary | ICD-10-CM | POA: Diagnosis not present

## 2019-01-05 ENCOUNTER — Other Ambulatory Visit: Payer: Self-pay

## 2019-01-05 DIAGNOSIS — F329 Major depressive disorder, single episode, unspecified: Secondary | ICD-10-CM

## 2019-01-06 ENCOUNTER — Other Ambulatory Visit: Payer: Self-pay

## 2019-01-06 MED ORDER — METFORMIN HCL ER 500 MG PO TB24: 1500.0000 mg | ORAL_TABLET | Freq: Every day | ORAL | 0 refills | Status: AC

## 2019-01-06 NOTE — Telephone Encounter (Signed)
Copied from [place] [phone]. Topic: General - Other >> Jan 06, 2019  9:12 AM Leward Quan A wrote: Reason for CRM: Patient called to say that she have been trying to get in touch with her diabetic doctor but all the phone does is ring. Also say the pharmacy is getting no response and she is all out of Metformin 500 mg TID. Patient is asking for a call back from the nurse to see if Dr Ancil Boozer could fill it for her. Please call Ph#  [phone]

## 2019-01-08 ENCOUNTER — Encounter: Payer: Self-pay | Admitting: Family Medicine

## 2019-01-08 ENCOUNTER — Telehealth: Payer: Self-pay | Admitting: Internal Medicine

## 2019-01-08 DIAGNOSIS — E278 Other specified disorders of adrenal gland: Secondary | ICD-10-CM | POA: Insufficient documentation

## 2019-01-08 DIAGNOSIS — I700 Atherosclerosis of aorta: Secondary | ICD-10-CM | POA: Insufficient documentation

## 2019-01-08 DIAGNOSIS — I77819 Aortic ectasia, unspecified site: Secondary | ICD-10-CM | POA: Insufficient documentation

## 2019-01-08 NOTE — Telephone Encounter (Signed)
Barbara Bowen, I believe Dr. B wanted to speak to her in person face to face to show her the pictures from her images instead of a dox visit.  Dr. B please advise.

## 2019-01-08 NOTE — Telephone Encounter (Signed)
C-Please schedule the patient to follow-up with me in the next week or so to discuss the results of the CT scan/blood work.  No labs.  GB

## 2019-01-14 NOTE — Telephone Encounter (Signed)
Barbara Bowen, please schedule patient for reg. visit IN OFFICE.

## 2019-01-14 NOTE — Telephone Encounter (Signed)
H/C- I would like to schedule a regular visit in clinic; No labs- Dr.B

## 2019-01-22 ENCOUNTER — Encounter: Payer: Self-pay | Admitting: *Deleted

## 2019-01-22 DIAGNOSIS — J449 Chronic obstructive pulmonary disease, unspecified: Secondary | ICD-10-CM | POA: Diagnosis not present

## 2019-01-27 NOTE — Progress Notes (Signed)
Patient is requesting dox visit

## 2019-01-28 ENCOUNTER — Other Ambulatory Visit: Payer: Self-pay

## 2019-01-28 ENCOUNTER — Other Ambulatory Visit: Payer: Self-pay | Admitting: Family Medicine

## 2019-01-28 ENCOUNTER — Inpatient Hospital Stay: Payer: Medicaid Other | Attending: Internal Medicine | Admitting: Internal Medicine

## 2019-01-28 DIAGNOSIS — E278 Other specified disorders of adrenal gland: Secondary | ICD-10-CM

## 2019-01-28 DIAGNOSIS — D729 Disorder of white blood cells, unspecified: Secondary | ICD-10-CM

## 2019-01-28 DIAGNOSIS — J449 Chronic obstructive pulmonary disease, unspecified: Secondary | ICD-10-CM

## 2019-01-28 DIAGNOSIS — Z720 Tobacco use: Secondary | ICD-10-CM

## 2019-01-28 NOTE — Assessment & Plan Note (Addendum)
#  Leukocytosis-neutrophilia/lymphocytosis-asymptomati/chronic.  15-16,000-likely secondary/smoking; peripheral blood flow cytometry negative for any neoplastic process.  If continues to get worse would recommend-BCR ABL/bone marrow biopsy.  For now recommend repeat labs in 6 months.  # COPD/bilateral subcentimeter lung nodules /nonspecific interstitial changes/question chronic thromboembolic findings noted on CT scan.  Recommend referral to pulmonary.  Will defer to PCP for the referral.  #Again counseled against smoking-patient not very interested.  Again, encouraged the patient to speak to PCP.  #Adrenal nodule 1.  3 cm recommend MRI of the abdomen in the next few weeks/open MRI.  #  Discussed regarding lung cancer screening program at length; which includes low-dose CT scan on annual basis for 5 years.  Based upon the findings patient would be recommended surveillance/biopsies/or surgery.  Lung cancer program has shown to save lives by early detection of lung cancer.  Will inform nurse navigator's.  # DISPOSITION: will call with results of MRI.  # open mri/ abdomen.  # follow up in 6 months; MD/CBC/CMP; BCR ABL FISH- Dr.B

## 2019-01-28 NOTE — Progress Notes (Signed)
I connected with Barbara Bowen on 01/28/19 at  2:00 PM EDT by video enabled telemedicine visit and verified that I am speaking with the correct person using two identifiers.  I discussed the limitations, risks, security and privacy concerns of performing an evaluation and management service by telemedicine and the availability of in-person appointments. I also discussed with the patient that there may be a patient responsible charge related to this service. The patient expressed understanding and agreed to proceed.    Other persons participating in the visit and their role in the encounter: Nurse/medical reconciliation Patient'Bowen location: Home Provider'Bowen location: Office  Oncology History   No history exists.     Chief Complaint: Leukocytosis   History of present illness:Barbara Bowen Favorite 63 y.o.  female with history of longstanding history of smoking-is here to review the results of the recent blood work/CT scans done for weight loss.  Patient unfortunately continues to smoke.  Denies any blood in stools black or stools.  Denies any nausea vomiting but no fevers or chills.   Complains of chronic shortness of breath chronic cough.  Observation/objective:  Assessment and plan: Neutrophilia #  Leukocytosis-neutrophilia/lymphocytosis-asymptomati/chronic.  15-16,000-likely secondary/smoking; peripheral blood flow cytometry negative for any neoplastic process.  If continues to get worse would recommend-BCR ABL/bone marrow biopsy.  For now recommend repeat labs in 6 months.  # COPD/bilateral subcentimeter lung nodules /nonspecific interstitial changes/question chronic thromboembolic findings noted on CT scan.  Recommend referral to pulmonary.  Will defer to PCP for the referral.  #Again counseled against smoking-patient not very interested.  Again, encouraged the patient to speak to PCP.  #Adrenal nodule 1.  3 cm recommend MRI of the abdomen in the next few weeks/open MRI.  #  Discussed  regarding lung cancer screening program at length; which includes low-dose CT scan on annual basis for 5 years.  Based upon the findings patient would be recommended surveillance/biopsies/or surgery.  Lung cancer program has shown to save lives by early detection of lung cancer.  Will inform nurse navigator'Bowen.  # DISPOSITION: will call with results of MRI.  # open mri/ abdomen.  # follow up in 6 months; MD/CBC/CMP; BCR ABL FISH- Dr.B  Follow-up instructions:  I discussed the assessment and treatment plan with the patient.  The patient was provided an opportunity to ask questions and all were answered.  The patient agreed with the plan and demonstrated understanding of instructions.  The patient was advised to call back or seek an in person evaluation if the symptoms worsen or if the condition fails to improve as anticipated.  Dr. Charlaine Dalton [facility] at [facility] 01/28/2019 2:36 PM

## 2019-01-31 DIAGNOSIS — J449 Chronic obstructive pulmonary disease, unspecified: Secondary | ICD-10-CM | POA: Diagnosis not present

## 2019-02-02 DIAGNOSIS — E1165 Type 2 diabetes mellitus with hyperglycemia: Secondary | ICD-10-CM | POA: Diagnosis not present

## 2019-02-04 ENCOUNTER — Other Ambulatory Visit: Payer: Self-pay

## 2019-02-04 ENCOUNTER — Encounter: Payer: Self-pay | Admitting: Pulmonary Disease

## 2019-02-04 ENCOUNTER — Ambulatory Visit: Payer: Medicaid Other | Admitting: Pulmonary Disease

## 2019-02-04 VITALS — BP 138/78 | HR 68 | Temp 97.9°F | Ht 61.0 in | Wt 184.0 lb

## 2019-02-04 DIAGNOSIS — F419 Anxiety disorder, unspecified: Secondary | ICD-10-CM

## 2019-02-04 DIAGNOSIS — E669 Obesity, unspecified: Secondary | ICD-10-CM

## 2019-02-04 DIAGNOSIS — F17210 Nicotine dependence, cigarettes, uncomplicated: Secondary | ICD-10-CM

## 2019-02-04 DIAGNOSIS — J84115 Respiratory bronchiolitis interstitial lung disease: Secondary | ICD-10-CM

## 2019-02-04 DIAGNOSIS — R0602 Shortness of breath: Secondary | ICD-10-CM

## 2019-02-04 NOTE — Patient Instructions (Signed)
1.  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  There is no medication to help with this.  2.  We have referred you to behavioral health for your issues with insomnia.  3.  We have schedule breathing tests.  4.  We will see you in follow-up after the breathing tests are done.  5.  It is highly recommended that you stop smoking.  6.  Thank you for trusting [place] with your care.

## 2019-02-06 ENCOUNTER — Telehealth: Payer: Self-pay | Admitting: *Deleted

## 2019-02-06 ENCOUNTER — Other Ambulatory Visit: Payer: Self-pay | Admitting: *Deleted

## 2019-02-06 MED ORDER — DIAZEPAM 5 MG PO TABS: 10.0000 mg | ORAL_TABLET | Freq: Once | ORAL | 0 refills | Status: AC

## 2019-02-06 NOTE — Telephone Encounter (Signed)
[facility] to [facility]

## 2019-02-06 NOTE — Telephone Encounter (Signed)
Patient informed prescription sent to pharmacy.

## 2019-02-06 NOTE — Telephone Encounter (Signed)
Patient called and states MRI told her to call you for Valium 10 mg prior to her MRI. Please advise

## 2019-02-10 ENCOUNTER — Ambulatory Visit: Payer: Medicaid Other

## 2019-02-22 DIAGNOSIS — J449 Chronic obstructive pulmonary disease, unspecified: Secondary | ICD-10-CM | POA: Diagnosis not present

## 2019-02-24 ENCOUNTER — Ambulatory Visit: Payer: Medicaid Other

## 2019-03-03 DIAGNOSIS — J449 Chronic obstructive pulmonary disease, unspecified: Secondary | ICD-10-CM | POA: Diagnosis not present

## 2019-03-24 DIAGNOSIS — J449 Chronic obstructive pulmonary disease, unspecified: Secondary | ICD-10-CM | POA: Diagnosis not present

## 2019-03-31 ENCOUNTER — Telehealth: Payer: Self-pay | Admitting: Cardiovascular Disease

## 2019-03-31 NOTE — Telephone Encounter (Signed)
Patient calling Patient has moved to [place], [place]  Would like to know if Dr Rockey Situ has a cardiologist he could recommend in that area Please call to discuss

## 2019-04-01 NOTE — Telephone Encounter (Signed)
Spoke with patient and reviewed that one of our other providers recommended Dr. Ronnell Freshwater and provided her with phone number, address, and spelling of his name. She was very appreciate for the information and had no further questions at this time.

## 2019-04-02 DIAGNOSIS — J449 Chronic obstructive pulmonary disease, unspecified: Secondary | ICD-10-CM | POA: Diagnosis not present

## 2019-04-24 DIAGNOSIS — J449 Chronic obstructive pulmonary disease, unspecified: Secondary | ICD-10-CM | POA: Diagnosis not present

## 2019-05-03 DIAGNOSIS — R2231 Localized swelling, mass and lump, right upper limb: Secondary | ICD-10-CM | POA: Diagnosis not present

## 2019-05-03 DIAGNOSIS — M7989 Other specified soft tissue disorders: Secondary | ICD-10-CM | POA: Diagnosis not present

## 2019-05-03 DIAGNOSIS — S6991XA Unspecified injury of right wrist, hand and finger(s), initial encounter: Secondary | ICD-10-CM | POA: Diagnosis not present

## 2019-05-03 DIAGNOSIS — J449 Chronic obstructive pulmonary disease, unspecified: Secondary | ICD-10-CM | POA: Diagnosis not present

## 2019-05-03 DIAGNOSIS — F17210 Nicotine dependence, cigarettes, uncomplicated: Secondary | ICD-10-CM | POA: Diagnosis not present

## 2019-05-03 DIAGNOSIS — M79641 Pain in right hand: Secondary | ICD-10-CM | POA: Diagnosis not present

## 2019-05-05 DIAGNOSIS — M65331 Trigger finger, right middle finger: Secondary | ICD-10-CM | POA: Diagnosis not present

## 2019-05-12 ENCOUNTER — Ambulatory Visit: Payer: Medicaid Other

## 2019-05-18 ENCOUNTER — Ambulatory Visit: Admission: RE | Admit: 2019-05-18 | Payer: Medicaid Other | Source: Ambulatory Visit

## 2019-05-25 DIAGNOSIS — J449 Chronic obstructive pulmonary disease, unspecified: Secondary | ICD-10-CM | POA: Diagnosis not present

## 2019-06-03 DIAGNOSIS — J449 Chronic obstructive pulmonary disease, unspecified: Secondary | ICD-10-CM | POA: Diagnosis not present

## 2019-06-04 ENCOUNTER — Other Ambulatory Visit: Payer: Self-pay | Admitting: Cardiovascular Disease

## 2019-06-04 ENCOUNTER — Other Ambulatory Visit: Payer: Self-pay

## 2019-06-04 ENCOUNTER — Telehealth: Payer: Self-pay

## 2019-06-04 MED ORDER — NEBIVOLOL HCL 10 MG PO TABS: 10.0000 mg | ORAL_TABLET | Freq: Every day | ORAL | 3 refills | Status: DC

## 2019-06-04 NOTE — Telephone Encounter (Signed)
Medication Samples have been provided to the patient.  Drug name: Bystolic       Strength: 10mg         Qty: 1  LOTPA:[id number]  Exp.Date: 2/22  [name] 9:56 AM 06/04/2019

## 2019-06-04 NOTE — Telephone Encounter (Signed)
nebivolol (BYSTOLIC) 10 MG tablet 90 tablet 3 06/04/2019    Sig - Route: Take 1 tablet (10 mg total) by mouth daily. - Oral   Sent to pharmacy as: nebivolol (BYSTOLIC) 10 MG tablet   E-Prescribing Status: Receipt confirmed by pharmacy (06/04/2019  9:50 AM EST)   Pharmacy  [id number] - [place], [place] - [address]

## 2019-06-04 NOTE — Telephone Encounter (Signed)
Insurance does not [employer], commercial. Alternatives that they do cover are Carvedilol 25 mg or Metoprolol 25 mg. Which would you recommend? Thanks

## 2019-06-04 NOTE — Telephone Encounter (Signed)
Bystolic 10mg  is not covered by insurance. Covered alternatives are carvedilol 25mg  or metoprolol 25mg  tablets. Please advise if change is appropriate. Thank you!

## 2019-06-04 NOTE — Telephone Encounter (Signed)
Patient came by office requesting samples  *STAT* If patient is at the pharmacy, call can be transferred to refill team.   1. Which medications need to be refilled? (please list name of each medication and dose if known)  Bystolic 10 MG 1 tablet daily - states it will need a prior auth   2. Which pharmacy/location (including street and city if local pharmacy) is medication to be sent to? [facility] [place]  3. Do they need a 30 day or 90 day supply? 90 day

## 2019-06-06 NOTE — Telephone Encounter (Signed)
We can stop the bystolic Change to metoprolol succinate 25 mg daily

## 2019-06-08 DIAGNOSIS — E1165 Type 2 diabetes mellitus with hyperglycemia: Secondary | ICD-10-CM | POA: Diagnosis not present

## 2019-06-08 MED ORDER — METOPROLOL SUCCINATE ER 25 MG PO TB24: 25.0000 mg | ORAL_TABLET | Freq: Every day | ORAL | 3 refills | Status: AC

## 2019-06-08 NOTE — Addendum Note (Signed)
Addended by: Valora Corporal on: 06/08/2019 10:14 AM   Modules accepted: Orders

## 2019-06-08 NOTE — Telephone Encounter (Signed)
Spoke with patient regarding denial of bystolic and alternatives recommended. She was understandably upset because this medication has really helped. Reviewed that I will send in new script to her pharmacy and to let us know if she has any further questions or concerns.

## 2019-06-09 DIAGNOSIS — M65331 Trigger finger, right middle finger: Secondary | ICD-10-CM | POA: Diagnosis not present

## 2019-06-22 DIAGNOSIS — J449 Chronic obstructive pulmonary disease, unspecified: Secondary | ICD-10-CM | POA: Diagnosis not present

## 2019-07-02 DIAGNOSIS — R9431 Abnormal electrocardiogram [ECG] [EKG]: Secondary | ICD-10-CM | POA: Diagnosis not present

## 2019-07-02 DIAGNOSIS — I2510 Atherosclerotic heart disease of native coronary artery without angina pectoris: Secondary | ICD-10-CM | POA: Diagnosis not present

## 2019-07-02 DIAGNOSIS — I10 Essential (primary) hypertension: Secondary | ICD-10-CM | POA: Diagnosis not present

## 2019-07-02 DIAGNOSIS — J449 Chronic obstructive pulmonary disease, unspecified: Secondary | ICD-10-CM | POA: Diagnosis not present

## 2019-07-02 DIAGNOSIS — Q211 Atrial septal defect: Secondary | ICD-10-CM | POA: Diagnosis not present

## 2019-07-02 DIAGNOSIS — E785 Hyperlipidemia, unspecified: Secondary | ICD-10-CM | POA: Diagnosis not present

## 2019-07-15 ENCOUNTER — Encounter: Payer: Self-pay | Admitting: Family Medicine

## 2019-07-22 DIAGNOSIS — M65331 Trigger finger, right middle finger: Secondary | ICD-10-CM | POA: Diagnosis not present

## 2019-07-28 ENCOUNTER — Other Ambulatory Visit: Payer: Self-pay | Admitting: *Deleted

## 2019-07-28 DIAGNOSIS — D729 Disorder of white blood cells, unspecified: Secondary | ICD-10-CM

## 2019-07-29 ENCOUNTER — Inpatient Hospital Stay: Payer: Medicaid Other | Admitting: Internal Medicine

## 2019-07-29 ENCOUNTER — Inpatient Hospital Stay: Payer: Medicaid Other | Attending: Internal Medicine

## 2019-07-31 DIAGNOSIS — Z79899 Other long term (current) drug therapy: Secondary | ICD-10-CM | POA: Diagnosis not present

## 2019-07-31 DIAGNOSIS — F17210 Nicotine dependence, cigarettes, uncomplicated: Secondary | ICD-10-CM | POA: Diagnosis not present

## 2019-07-31 DIAGNOSIS — I10 Essential (primary) hypertension: Secondary | ICD-10-CM | POA: Diagnosis not present

## 2019-07-31 DIAGNOSIS — Z7984 Long term (current) use of oral hypoglycemic drugs: Secondary | ICD-10-CM | POA: Diagnosis not present

## 2019-07-31 DIAGNOSIS — Z7982 Long term (current) use of aspirin: Secondary | ICD-10-CM | POA: Diagnosis not present

## 2019-07-31 DIAGNOSIS — Z7902 Long term (current) use of antithrombotics/antiplatelets: Secondary | ICD-10-CM | POA: Diagnosis not present

## 2019-07-31 DIAGNOSIS — M65331 Trigger finger, right middle finger: Secondary | ICD-10-CM | POA: Diagnosis not present

## 2019-07-31 DIAGNOSIS — Z8673 Personal history of transient ischemic attack (TIA), and cerebral infarction without residual deficits: Secondary | ICD-10-CM | POA: Diagnosis not present

## 2019-07-31 DIAGNOSIS — I2510 Atherosclerotic heart disease of native coronary artery without angina pectoris: Secondary | ICD-10-CM | POA: Diagnosis not present

## 2019-07-31 DIAGNOSIS — E669 Obesity, unspecified: Secondary | ICD-10-CM | POA: Diagnosis not present

## 2019-07-31 DIAGNOSIS — Z955 Presence of coronary angioplasty implant and graft: Secondary | ICD-10-CM | POA: Diagnosis not present

## 2019-07-31 DIAGNOSIS — G8929 Other chronic pain: Secondary | ICD-10-CM | POA: Diagnosis not present

## 2019-07-31 DIAGNOSIS — I252 Old myocardial infarction: Secondary | ICD-10-CM | POA: Diagnosis not present

## 2019-07-31 DIAGNOSIS — Z880 Allergy status to penicillin: Secondary | ICD-10-CM | POA: Diagnosis not present

## 2019-07-31 DIAGNOSIS — J449 Chronic obstructive pulmonary disease, unspecified: Secondary | ICD-10-CM | POA: Diagnosis not present

## 2019-07-31 DIAGNOSIS — Z881 Allergy status to other antibiotic agents status: Secondary | ICD-10-CM | POA: Diagnosis not present

## 2019-07-31 DIAGNOSIS — E119 Type 2 diabetes mellitus without complications: Secondary | ICD-10-CM | POA: Diagnosis not present

## 2019-07-31 DIAGNOSIS — Z6835 Body mass index (BMI) 35.0-35.9, adult: Secondary | ICD-10-CM | POA: Diagnosis not present

## 2019-08-01 DIAGNOSIS — Z20822 Contact with and (suspected) exposure to covid-19: Secondary | ICD-10-CM | POA: Diagnosis not present

## 2019-08-01 DIAGNOSIS — Z01812 Encounter for preprocedural laboratory examination: Secondary | ICD-10-CM | POA: Diagnosis not present

## 2019-08-05 DIAGNOSIS — Z881 Allergy status to other antibiotic agents status: Secondary | ICD-10-CM | POA: Diagnosis not present

## 2019-08-05 DIAGNOSIS — Z79899 Other long term (current) drug therapy: Secondary | ICD-10-CM | POA: Diagnosis not present

## 2019-08-05 DIAGNOSIS — I252 Old myocardial infarction: Secondary | ICD-10-CM | POA: Diagnosis not present

## 2019-08-05 DIAGNOSIS — G8929 Other chronic pain: Secondary | ICD-10-CM | POA: Diagnosis not present

## 2019-08-05 DIAGNOSIS — J449 Chronic obstructive pulmonary disease, unspecified: Secondary | ICD-10-CM | POA: Diagnosis not present

## 2019-08-05 DIAGNOSIS — I10 Essential (primary) hypertension: Secondary | ICD-10-CM | POA: Diagnosis not present

## 2019-08-05 DIAGNOSIS — Z7984 Long term (current) use of oral hypoglycemic drugs: Secondary | ICD-10-CM | POA: Diagnosis not present

## 2019-08-05 DIAGNOSIS — M65331 Trigger finger, right middle finger: Secondary | ICD-10-CM | POA: Diagnosis not present

## 2019-08-05 DIAGNOSIS — Z7902 Long term (current) use of antithrombotics/antiplatelets: Secondary | ICD-10-CM | POA: Diagnosis not present

## 2019-08-05 DIAGNOSIS — Z6835 Body mass index (BMI) 35.0-35.9, adult: Secondary | ICD-10-CM | POA: Diagnosis not present

## 2019-08-05 DIAGNOSIS — Z8673 Personal history of transient ischemic attack (TIA), and cerebral infarction without residual deficits: Secondary | ICD-10-CM | POA: Diagnosis not present

## 2019-08-05 DIAGNOSIS — I2510 Atherosclerotic heart disease of native coronary artery without angina pectoris: Secondary | ICD-10-CM | POA: Diagnosis not present

## 2019-08-05 DIAGNOSIS — E669 Obesity, unspecified: Secondary | ICD-10-CM | POA: Diagnosis not present

## 2019-08-05 DIAGNOSIS — Z955 Presence of coronary angioplasty implant and graft: Secondary | ICD-10-CM | POA: Diagnosis not present

## 2019-08-05 DIAGNOSIS — Z880 Allergy status to penicillin: Secondary | ICD-10-CM | POA: Diagnosis not present

## 2019-08-05 DIAGNOSIS — E119 Type 2 diabetes mellitus without complications: Secondary | ICD-10-CM | POA: Diagnosis not present

## 2019-08-05 DIAGNOSIS — F17210 Nicotine dependence, cigarettes, uncomplicated: Secondary | ICD-10-CM | POA: Diagnosis not present

## 2019-08-05 DIAGNOSIS — Z7982 Long term (current) use of aspirin: Secondary | ICD-10-CM | POA: Diagnosis not present

## 2019-08-17 DIAGNOSIS — I776 Arteritis, unspecified: Secondary | ICD-10-CM | POA: Diagnosis not present

## 2019-08-17 DIAGNOSIS — I517 Cardiomegaly: Secondary | ICD-10-CM | POA: Diagnosis not present

## 2019-08-17 DIAGNOSIS — I2510 Atherosclerotic heart disease of native coronary artery without angina pectoris: Secondary | ICD-10-CM | POA: Diagnosis not present

## 2019-08-17 DIAGNOSIS — R079 Chest pain, unspecified: Secondary | ICD-10-CM | POA: Diagnosis not present

## 2019-08-17 DIAGNOSIS — R1032 Left lower quadrant pain: Secondary | ICD-10-CM | POA: Diagnosis not present

## 2019-08-17 DIAGNOSIS — J984 Other disorders of lung: Secondary | ICD-10-CM | POA: Diagnosis not present

## 2019-08-17 DIAGNOSIS — F17210 Nicotine dependence, cigarettes, uncomplicated: Secondary | ICD-10-CM | POA: Diagnosis not present

## 2019-08-17 DIAGNOSIS — Z959 Presence of cardiac and vascular implant and graft, unspecified: Secondary | ICD-10-CM | POA: Diagnosis not present

## 2019-08-17 DIAGNOSIS — K859 Acute pancreatitis without necrosis or infection, unspecified: Secondary | ICD-10-CM | POA: Diagnosis not present

## 2019-08-17 DIAGNOSIS — I252 Old myocardial infarction: Secondary | ICD-10-CM | POA: Diagnosis not present

## 2019-08-17 DIAGNOSIS — R0789 Other chest pain: Secondary | ICD-10-CM | POA: Diagnosis not present

## 2019-08-20 DIAGNOSIS — M65331 Trigger finger, right middle finger: Secondary | ICD-10-CM | POA: Diagnosis not present

## 2019-08-26 DIAGNOSIS — M65331 Trigger finger, right middle finger: Secondary | ICD-10-CM | POA: Diagnosis not present

## 2019-09-02 DIAGNOSIS — M65331 Trigger finger, right middle finger: Secondary | ICD-10-CM | POA: Diagnosis not present

## 2019-09-14 DIAGNOSIS — M65331 Trigger finger, right middle finger: Secondary | ICD-10-CM | POA: Diagnosis not present

## 2019-09-23 DIAGNOSIS — M65331 Trigger finger, right middle finger: Secondary | ICD-10-CM | POA: Diagnosis not present

## 2019-09-30 DIAGNOSIS — M65331 Trigger finger, right middle finger: Secondary | ICD-10-CM | POA: Diagnosis not present

## 2019-10-02 DIAGNOSIS — M65331 Trigger finger, right middle finger: Secondary | ICD-10-CM | POA: Diagnosis not present

## 2019-10-09 DIAGNOSIS — M65331 Trigger finger, right middle finger: Secondary | ICD-10-CM | POA: Diagnosis not present

## 2019-10-19 DIAGNOSIS — M65331 Trigger finger, right middle finger: Secondary | ICD-10-CM | POA: Diagnosis not present

## 2019-10-22 DIAGNOSIS — Z419 Encounter for procedure for purposes other than remedying health state, unspecified: Secondary | ICD-10-CM | POA: Diagnosis not present

## 2019-11-22 DIAGNOSIS — Z419 Encounter for procedure for purposes other than remedying health state, unspecified: Secondary | ICD-10-CM | POA: Diagnosis not present

## 2019-11-30 DIAGNOSIS — M79644 Pain in right finger(s): Secondary | ICD-10-CM | POA: Diagnosis not present

## 2019-11-30 DIAGNOSIS — M152 Bouchard's nodes (with arthropathy): Secondary | ICD-10-CM | POA: Diagnosis not present

## 2019-11-30 DIAGNOSIS — M24541 Contracture, right hand: Secondary | ICD-10-CM | POA: Diagnosis not present

## 2019-11-30 DIAGNOSIS — M65331 Trigger finger, right middle finger: Secondary | ICD-10-CM | POA: Diagnosis not present

## 2019-12-22 DIAGNOSIS — R109 Unspecified abdominal pain: Secondary | ICD-10-CM | POA: Diagnosis not present

## 2019-12-22 DIAGNOSIS — Z7902 Long term (current) use of antithrombotics/antiplatelets: Secondary | ICD-10-CM | POA: Diagnosis not present

## 2019-12-22 DIAGNOSIS — F17210 Nicotine dependence, cigarettes, uncomplicated: Secondary | ICD-10-CM | POA: Diagnosis not present

## 2019-12-22 DIAGNOSIS — Z955 Presence of coronary angioplasty implant and graft: Secondary | ICD-10-CM | POA: Diagnosis not present

## 2019-12-22 DIAGNOSIS — D72829 Elevated white blood cell count, unspecified: Secondary | ICD-10-CM | POA: Diagnosis not present

## 2019-12-22 DIAGNOSIS — I7090 Unspecified atherosclerosis: Secondary | ICD-10-CM | POA: Diagnosis not present

## 2019-12-22 DIAGNOSIS — R9389 Abnormal findings on diagnostic imaging of other specified body structures: Secondary | ICD-10-CM | POA: Diagnosis not present

## 2019-12-22 DIAGNOSIS — I2510 Atherosclerotic heart disease of native coronary artery without angina pectoris: Secondary | ICD-10-CM | POA: Diagnosis not present

## 2019-12-22 DIAGNOSIS — E119 Type 2 diabetes mellitus without complications: Secondary | ICD-10-CM | POA: Diagnosis not present

## 2019-12-22 DIAGNOSIS — M549 Dorsalgia, unspecified: Secondary | ICD-10-CM | POA: Diagnosis not present

## 2019-12-22 DIAGNOSIS — J449 Chronic obstructive pulmonary disease, unspecified: Secondary | ICD-10-CM | POA: Diagnosis not present

## 2019-12-22 DIAGNOSIS — Z7982 Long term (current) use of aspirin: Secondary | ICD-10-CM | POA: Diagnosis not present

## 2019-12-22 DIAGNOSIS — Z6835 Body mass index (BMI) 35.0-35.9, adult: Secondary | ICD-10-CM | POA: Diagnosis not present

## 2019-12-22 DIAGNOSIS — R948 Abnormal results of function studies of other organs and systems: Secondary | ICD-10-CM | POA: Diagnosis not present

## 2019-12-22 DIAGNOSIS — E669 Obesity, unspecified: Secondary | ICD-10-CM | POA: Diagnosis not present

## 2019-12-23 DIAGNOSIS — Z419 Encounter for procedure for purposes other than remedying health state, unspecified: Secondary | ICD-10-CM | POA: Diagnosis not present

## 2019-12-30 ENCOUNTER — Other Ambulatory Visit: Payer: Self-pay | Admitting: Family Medicine

## 2019-12-30 DIAGNOSIS — Z1239 Encounter for other screening for malignant neoplasm of breast: Secondary | ICD-10-CM

## 2020-01-08 DIAGNOSIS — I10 Essential (primary) hypertension: Secondary | ICD-10-CM | POA: Diagnosis not present

## 2020-01-08 DIAGNOSIS — R935 Abnormal findings on diagnostic imaging of other abdominal regions, including retroperitoneum: Secondary | ICD-10-CM | POA: Diagnosis not present

## 2020-01-08 DIAGNOSIS — Z0130 Encounter for examination of blood pressure without abnormal findings: Secondary | ICD-10-CM | POA: Diagnosis not present

## 2020-01-08 DIAGNOSIS — J439 Emphysema, unspecified: Secondary | ICD-10-CM | POA: Diagnosis not present

## 2020-01-08 DIAGNOSIS — L989 Disorder of the skin and subcutaneous tissue, unspecified: Secondary | ICD-10-CM | POA: Diagnosis not present

## 2020-01-08 DIAGNOSIS — E119 Type 2 diabetes mellitus without complications: Secondary | ICD-10-CM | POA: Diagnosis not present

## 2020-01-08 DIAGNOSIS — E669 Obesity, unspecified: Secondary | ICD-10-CM | POA: Diagnosis not present

## 2020-01-08 DIAGNOSIS — E1169 Type 2 diabetes mellitus with other specified complication: Secondary | ICD-10-CM | POA: Diagnosis not present

## 2020-01-08 DIAGNOSIS — I2510 Atherosclerotic heart disease of native coronary artery without angina pectoris: Secondary | ICD-10-CM | POA: Diagnosis not present

## 2020-01-15 ENCOUNTER — Telehealth: Payer: Self-pay | Admitting: Cardiovascular Disease

## 2020-01-15 NOTE — Telephone Encounter (Signed)
Patient was called for a recall. Patient has moved out town. Recall has been deleted

## 2020-01-20 DIAGNOSIS — N135 Crossing vessel and stricture of ureter without hydronephrosis: Secondary | ICD-10-CM | POA: Diagnosis not present

## 2020-01-20 DIAGNOSIS — I776 Arteritis, unspecified: Secondary | ICD-10-CM | POA: Diagnosis not present

## 2020-01-30 DIAGNOSIS — I2510 Atherosclerotic heart disease of native coronary artery without angina pectoris: Secondary | ICD-10-CM | POA: Diagnosis not present

## 2020-01-30 DIAGNOSIS — E669 Obesity, unspecified: Secondary | ICD-10-CM | POA: Diagnosis not present

## 2020-01-30 DIAGNOSIS — J449 Chronic obstructive pulmonary disease, unspecified: Secondary | ICD-10-CM | POA: Diagnosis not present

## 2020-01-30 DIAGNOSIS — F17210 Nicotine dependence, cigarettes, uncomplicated: Secondary | ICD-10-CM | POA: Diagnosis not present

## 2020-01-30 DIAGNOSIS — E119 Type 2 diabetes mellitus without complications: Secondary | ICD-10-CM | POA: Diagnosis not present

## 2020-01-30 DIAGNOSIS — I712 Thoracic aortic aneurysm, without rupture: Secondary | ICD-10-CM | POA: Diagnosis not present

## 2020-01-30 DIAGNOSIS — Z7982 Long term (current) use of aspirin: Secondary | ICD-10-CM | POA: Diagnosis not present

## 2020-01-30 DIAGNOSIS — I700 Atherosclerosis of aorta: Secondary | ICD-10-CM | POA: Diagnosis not present

## 2020-01-30 DIAGNOSIS — Z955 Presence of coronary angioplasty implant and graft: Secondary | ICD-10-CM | POA: Diagnosis not present

## 2020-01-30 DIAGNOSIS — Z6835 Body mass index (BMI) 35.0-35.9, adult: Secondary | ICD-10-CM | POA: Diagnosis not present

## 2020-01-30 DIAGNOSIS — Z7984 Long term (current) use of oral hypoglycemic drugs: Secondary | ICD-10-CM | POA: Diagnosis not present

## 2020-01-30 DIAGNOSIS — R079 Chest pain, unspecified: Secondary | ICD-10-CM | POA: Diagnosis not present

## 2020-01-30 DIAGNOSIS — Z79899 Other long term (current) drug therapy: Secondary | ICD-10-CM | POA: Diagnosis not present

## 2020-01-30 DIAGNOSIS — I119 Hypertensive heart disease without heart failure: Secondary | ICD-10-CM | POA: Diagnosis not present

## 2020-01-30 DIAGNOSIS — R002 Palpitations: Secondary | ICD-10-CM | POA: Diagnosis not present

## 2020-01-30 DIAGNOSIS — R0789 Other chest pain: Secondary | ICD-10-CM | POA: Diagnosis not present

## 2020-02-09 DIAGNOSIS — E119 Type 2 diabetes mellitus without complications: Secondary | ICD-10-CM | POA: Diagnosis not present

## 2020-02-09 DIAGNOSIS — Z0130 Encounter for examination of blood pressure without abnormal findings: Secondary | ICD-10-CM | POA: Diagnosis not present

## 2020-02-09 DIAGNOSIS — J449 Chronic obstructive pulmonary disease, unspecified: Secondary | ICD-10-CM | POA: Diagnosis not present

## 2020-02-09 DIAGNOSIS — Z23 Encounter for immunization: Secondary | ICD-10-CM | POA: Diagnosis not present

## 2020-02-09 DIAGNOSIS — R5383 Other fatigue: Secondary | ICD-10-CM | POA: Diagnosis not present

## 2020-02-11 DIAGNOSIS — I10 Essential (primary) hypertension: Secondary | ICD-10-CM | POA: Diagnosis not present

## 2020-02-11 DIAGNOSIS — R079 Chest pain, unspecified: Secondary | ICD-10-CM | POA: Diagnosis not present

## 2020-02-11 DIAGNOSIS — I2510 Atherosclerotic heart disease of native coronary artery without angina pectoris: Secondary | ICD-10-CM | POA: Diagnosis not present

## 2020-02-11 DIAGNOSIS — Q245 Malformation of coronary vessels: Secondary | ICD-10-CM | POA: Diagnosis not present

## 2020-02-12 DIAGNOSIS — R5383 Other fatigue: Secondary | ICD-10-CM | POA: Diagnosis not present

## 2020-02-18 DIAGNOSIS — L918 Other hypertrophic disorders of the skin: Secondary | ICD-10-CM | POA: Diagnosis not present

## 2020-02-18 DIAGNOSIS — L732 Hidradenitis suppurativa: Secondary | ICD-10-CM | POA: Diagnosis not present

## 2020-02-18 DIAGNOSIS — L905 Scar conditions and fibrosis of skin: Secondary | ICD-10-CM | POA: Diagnosis not present

## 2020-03-10 DIAGNOSIS — J029 Acute pharyngitis, unspecified: Secondary | ICD-10-CM | POA: Diagnosis not present

## 2020-03-10 DIAGNOSIS — R519 Headache, unspecified: Secondary | ICD-10-CM | POA: Diagnosis not present

## 2020-03-10 DIAGNOSIS — R509 Fever, unspecified: Secondary | ICD-10-CM | POA: Diagnosis not present

## 2020-04-09 ENCOUNTER — Other Ambulatory Visit: Payer: Self-pay | Admitting: Physician Assistant

## 2020-04-13 DIAGNOSIS — Z0130 Encounter for examination of blood pressure without abnormal findings: Secondary | ICD-10-CM | POA: Diagnosis not present

## 2020-04-13 DIAGNOSIS — R6883 Chills (without fever): Secondary | ICD-10-CM | POA: Diagnosis not present

## 2020-04-13 DIAGNOSIS — J309 Allergic rhinitis, unspecified: Secondary | ICD-10-CM | POA: Diagnosis not present

## 2020-04-13 DIAGNOSIS — R059 Cough, unspecified: Secondary | ICD-10-CM | POA: Diagnosis not present

## 2020-04-13 DIAGNOSIS — R0981 Nasal congestion: Secondary | ICD-10-CM | POA: Diagnosis not present

## 2020-04-23 DIAGNOSIS — Z419 Encounter for procedure for purposes other than remedying health state, unspecified: Secondary | ICD-10-CM | POA: Diagnosis not present

## 2020-05-16 DIAGNOSIS — U071 COVID-19: Secondary | ICD-10-CM | POA: Diagnosis not present

## 2020-05-18 DIAGNOSIS — B342 Coronavirus infection, unspecified: Secondary | ICD-10-CM | POA: Diagnosis not present

## 2020-05-23 DIAGNOSIS — J449 Chronic obstructive pulmonary disease, unspecified: Secondary | ICD-10-CM | POA: Diagnosis not present

## 2020-05-23 DIAGNOSIS — I10 Essential (primary) hypertension: Secondary | ICD-10-CM | POA: Diagnosis not present

## 2020-05-23 DIAGNOSIS — R079 Chest pain, unspecified: Secondary | ICD-10-CM | POA: Diagnosis not present

## 2020-05-23 DIAGNOSIS — E785 Hyperlipidemia, unspecified: Secondary | ICD-10-CM | POA: Diagnosis not present

## 2020-05-24 DIAGNOSIS — Z419 Encounter for procedure for purposes other than remedying health state, unspecified: Secondary | ICD-10-CM | POA: Diagnosis not present

## 2020-05-27 DIAGNOSIS — B999 Unspecified infectious disease: Secondary | ICD-10-CM | POA: Diagnosis not present

## 2020-05-27 DIAGNOSIS — U071 COVID-19: Secondary | ICD-10-CM | POA: Diagnosis not present

## 2020-05-27 DIAGNOSIS — R112 Nausea with vomiting, unspecified: Secondary | ICD-10-CM | POA: Diagnosis not present

## 2020-05-27 DIAGNOSIS — K5900 Constipation, unspecified: Secondary | ICD-10-CM | POA: Diagnosis not present

## 2020-06-08 DIAGNOSIS — R439 Unspecified disturbances of smell and taste: Secondary | ICD-10-CM | POA: Diagnosis not present

## 2020-06-08 DIAGNOSIS — M549 Dorsalgia, unspecified: Secondary | ICD-10-CM | POA: Diagnosis not present

## 2020-06-08 DIAGNOSIS — Z0130 Encounter for examination of blood pressure without abnormal findings: Secondary | ICD-10-CM | POA: Diagnosis not present

## 2020-06-08 DIAGNOSIS — R110 Nausea: Secondary | ICD-10-CM | POA: Diagnosis not present

## 2020-06-13 DIAGNOSIS — M6281 Muscle weakness (generalized): Secondary | ICD-10-CM | POA: Diagnosis not present

## 2020-06-13 DIAGNOSIS — G8929 Other chronic pain: Secondary | ICD-10-CM | POA: Diagnosis not present

## 2020-06-13 DIAGNOSIS — M545 Low back pain, unspecified: Secondary | ICD-10-CM | POA: Diagnosis not present

## 2020-06-20 DIAGNOSIS — M6281 Muscle weakness (generalized): Secondary | ICD-10-CM | POA: Diagnosis not present

## 2020-06-20 DIAGNOSIS — M545 Low back pain, unspecified: Secondary | ICD-10-CM | POA: Diagnosis not present

## 2020-06-20 DIAGNOSIS — G8929 Other chronic pain: Secondary | ICD-10-CM | POA: Diagnosis not present

## 2020-06-21 DIAGNOSIS — Z419 Encounter for procedure for purposes other than remedying health state, unspecified: Secondary | ICD-10-CM | POA: Diagnosis not present

## 2020-06-29 DIAGNOSIS — M545 Low back pain, unspecified: Secondary | ICD-10-CM | POA: Diagnosis not present

## 2020-06-29 DIAGNOSIS — M6281 Muscle weakness (generalized): Secondary | ICD-10-CM | POA: Diagnosis not present

## 2020-06-29 DIAGNOSIS — G8929 Other chronic pain: Secondary | ICD-10-CM | POA: Diagnosis not present

## 2020-07-04 DIAGNOSIS — M545 Low back pain, unspecified: Secondary | ICD-10-CM | POA: Diagnosis not present

## 2020-07-04 DIAGNOSIS — G8929 Other chronic pain: Secondary | ICD-10-CM | POA: Diagnosis not present

## 2020-07-04 DIAGNOSIS — M6281 Muscle weakness (generalized): Secondary | ICD-10-CM | POA: Diagnosis not present

## 2020-07-06 DIAGNOSIS — Z0130 Encounter for examination of blood pressure without abnormal findings: Secondary | ICD-10-CM | POA: Diagnosis not present

## 2020-07-06 DIAGNOSIS — F424 Excoriation (skin-picking) disorder: Secondary | ICD-10-CM | POA: Diagnosis not present

## 2020-07-11 DIAGNOSIS — M545 Low back pain, unspecified: Secondary | ICD-10-CM | POA: Diagnosis not present

## 2020-07-11 DIAGNOSIS — G8929 Other chronic pain: Secondary | ICD-10-CM | POA: Diagnosis not present

## 2020-07-11 DIAGNOSIS — M6281 Muscle weakness (generalized): Secondary | ICD-10-CM | POA: Diagnosis not present

## 2020-07-13 DIAGNOSIS — M545 Low back pain, unspecified: Secondary | ICD-10-CM | POA: Diagnosis not present

## 2020-07-13 DIAGNOSIS — M6281 Muscle weakness (generalized): Secondary | ICD-10-CM | POA: Diagnosis not present

## 2020-07-13 DIAGNOSIS — G8929 Other chronic pain: Secondary | ICD-10-CM | POA: Diagnosis not present

## 2020-07-18 DIAGNOSIS — M6281 Muscle weakness (generalized): Secondary | ICD-10-CM | POA: Diagnosis not present

## 2020-07-18 DIAGNOSIS — M545 Low back pain, unspecified: Secondary | ICD-10-CM | POA: Diagnosis not present

## 2020-07-18 DIAGNOSIS — G8929 Other chronic pain: Secondary | ICD-10-CM | POA: Diagnosis not present

## 2020-07-20 DIAGNOSIS — M6281 Muscle weakness (generalized): Secondary | ICD-10-CM | POA: Diagnosis not present

## 2020-07-20 DIAGNOSIS — G8929 Other chronic pain: Secondary | ICD-10-CM | POA: Diagnosis not present

## 2020-07-20 DIAGNOSIS — M545 Low back pain, unspecified: Secondary | ICD-10-CM | POA: Diagnosis not present

## 2020-07-22 DIAGNOSIS — Z419 Encounter for procedure for purposes other than remedying health state, unspecified: Secondary | ICD-10-CM | POA: Diagnosis not present

## 2020-07-25 ENCOUNTER — Other Ambulatory Visit: Payer: Self-pay | Admitting: Physician Assistant

## 2020-07-26 DIAGNOSIS — Z9981 Dependence on supplemental oxygen: Secondary | ICD-10-CM | POA: Diagnosis not present

## 2020-07-26 DIAGNOSIS — I2510 Atherosclerotic heart disease of native coronary artery without angina pectoris: Secondary | ICD-10-CM | POA: Diagnosis not present

## 2020-07-26 DIAGNOSIS — I10 Essential (primary) hypertension: Secondary | ICD-10-CM | POA: Diagnosis not present

## 2020-07-26 DIAGNOSIS — E119 Type 2 diabetes mellitus without complications: Secondary | ICD-10-CM | POA: Diagnosis not present

## 2020-07-26 DIAGNOSIS — J449 Chronic obstructive pulmonary disease, unspecified: Secondary | ICD-10-CM | POA: Diagnosis not present

## 2020-07-26 DIAGNOSIS — I252 Old myocardial infarction: Secondary | ICD-10-CM | POA: Diagnosis not present

## 2020-07-26 DIAGNOSIS — Z7982 Long term (current) use of aspirin: Secondary | ICD-10-CM | POA: Diagnosis not present

## 2020-07-26 DIAGNOSIS — Z8673 Personal history of transient ischemic attack (TIA), and cerebral infarction without residual deficits: Secondary | ICD-10-CM | POA: Diagnosis not present

## 2020-07-26 DIAGNOSIS — Z79899 Other long term (current) drug therapy: Secondary | ICD-10-CM | POA: Diagnosis not present

## 2020-07-26 DIAGNOSIS — Z7984 Long term (current) use of oral hypoglycemic drugs: Secondary | ICD-10-CM | POA: Diagnosis not present

## 2020-07-26 DIAGNOSIS — R0789 Other chest pain: Secondary | ICD-10-CM | POA: Diagnosis not present

## 2020-07-26 DIAGNOSIS — F17210 Nicotine dependence, cigarettes, uncomplicated: Secondary | ICD-10-CM | POA: Diagnosis not present

## 2020-07-26 DIAGNOSIS — R079 Chest pain, unspecified: Secondary | ICD-10-CM | POA: Diagnosis not present

## 2020-07-26 DIAGNOSIS — Z955 Presence of coronary angioplasty implant and graft: Secondary | ICD-10-CM | POA: Diagnosis not present

## 2020-08-04 DIAGNOSIS — I493 Ventricular premature depolarization: Secondary | ICD-10-CM | POA: Diagnosis not present

## 2020-08-04 DIAGNOSIS — R079 Chest pain, unspecified: Secondary | ICD-10-CM | POA: Diagnosis not present

## 2020-08-04 DIAGNOSIS — R0789 Other chest pain: Secondary | ICD-10-CM | POA: Diagnosis not present

## 2020-08-05 ENCOUNTER — Other Ambulatory Visit: Payer: Self-pay | Admitting: Physician Assistant

## 2020-08-09 DIAGNOSIS — R079 Chest pain, unspecified: Secondary | ICD-10-CM | POA: Diagnosis not present

## 2020-08-09 DIAGNOSIS — I2510 Atherosclerotic heart disease of native coronary artery without angina pectoris: Secondary | ICD-10-CM | POA: Diagnosis not present

## 2020-08-21 DIAGNOSIS — Z419 Encounter for procedure for purposes other than remedying health state, unspecified: Secondary | ICD-10-CM | POA: Diagnosis not present

## 2020-08-30 DIAGNOSIS — J449 Chronic obstructive pulmonary disease, unspecified: Secondary | ICD-10-CM | POA: Diagnosis not present

## 2020-09-01 DIAGNOSIS — E119 Type 2 diabetes mellitus without complications: Secondary | ICD-10-CM | POA: Diagnosis not present

## 2020-09-01 DIAGNOSIS — M25512 Pain in left shoulder: Secondary | ICD-10-CM | POA: Diagnosis not present

## 2020-09-01 DIAGNOSIS — F419 Anxiety disorder, unspecified: Secondary | ICD-10-CM | POA: Diagnosis not present

## 2020-09-21 DIAGNOSIS — Z419 Encounter for procedure for purposes other than remedying health state, unspecified: Secondary | ICD-10-CM | POA: Diagnosis not present

## 2020-09-30 DIAGNOSIS — J449 Chronic obstructive pulmonary disease, unspecified: Secondary | ICD-10-CM | POA: Diagnosis not present

## 2020-10-21 DIAGNOSIS — Z419 Encounter for procedure for purposes other than remedying health state, unspecified: Secondary | ICD-10-CM | POA: Diagnosis not present

## 2020-10-21 DIAGNOSIS — Z0130 Encounter for examination of blood pressure without abnormal findings: Secondary | ICD-10-CM | POA: Diagnosis not present

## 2020-10-21 DIAGNOSIS — B351 Tinea unguium: Secondary | ICD-10-CM | POA: Diagnosis not present

## 2020-10-21 DIAGNOSIS — F419 Anxiety disorder, unspecified: Secondary | ICD-10-CM | POA: Diagnosis not present

## 2020-10-30 DIAGNOSIS — J449 Chronic obstructive pulmonary disease, unspecified: Secondary | ICD-10-CM | POA: Diagnosis not present

## 2020-11-14 DIAGNOSIS — J029 Acute pharyngitis, unspecified: Secondary | ICD-10-CM | POA: Diagnosis not present

## 2020-11-21 DIAGNOSIS — Z419 Encounter for procedure for purposes other than remedying health state, unspecified: Secondary | ICD-10-CM | POA: Diagnosis not present

## 2020-11-30 DIAGNOSIS — J449 Chronic obstructive pulmonary disease, unspecified: Secondary | ICD-10-CM | POA: Diagnosis not present

## 2020-12-19 DIAGNOSIS — G4700 Insomnia, unspecified: Secondary | ICD-10-CM | POA: Diagnosis not present

## 2020-12-19 DIAGNOSIS — F419 Anxiety disorder, unspecified: Secondary | ICD-10-CM | POA: Diagnosis not present

## 2020-12-22 DIAGNOSIS — Z419 Encounter for procedure for purposes other than remedying health state, unspecified: Secondary | ICD-10-CM | POA: Diagnosis not present

## 2020-12-31 DIAGNOSIS — J449 Chronic obstructive pulmonary disease, unspecified: Secondary | ICD-10-CM | POA: Diagnosis not present

## 2021-01-16 DIAGNOSIS — G4700 Insomnia, unspecified: Secondary | ICD-10-CM | POA: Diagnosis not present

## 2021-01-16 DIAGNOSIS — G4733 Obstructive sleep apnea (adult) (pediatric): Secondary | ICD-10-CM | POA: Diagnosis not present

## 2021-01-16 DIAGNOSIS — F419 Anxiety disorder, unspecified: Secondary | ICD-10-CM | POA: Diagnosis not present

## 2021-01-21 DIAGNOSIS — Z419 Encounter for procedure for purposes other than remedying health state, unspecified: Secondary | ICD-10-CM | POA: Diagnosis not present

## 2021-01-24 DIAGNOSIS — R1110 Vomiting, unspecified: Secondary | ICD-10-CM | POA: Diagnosis not present

## 2021-01-24 DIAGNOSIS — R197 Diarrhea, unspecified: Secondary | ICD-10-CM | POA: Diagnosis not present

## 2021-01-30 DIAGNOSIS — J449 Chronic obstructive pulmonary disease, unspecified: Secondary | ICD-10-CM | POA: Diagnosis not present

## 2021-02-02 DIAGNOSIS — J441 Chronic obstructive pulmonary disease with (acute) exacerbation: Secondary | ICD-10-CM | POA: Diagnosis not present

## 2021-02-02 DIAGNOSIS — R053 Chronic cough: Secondary | ICD-10-CM | POA: Diagnosis not present

## 2021-02-02 DIAGNOSIS — J019 Acute sinusitis, unspecified: Secondary | ICD-10-CM | POA: Diagnosis not present

## 2021-02-02 DIAGNOSIS — R0981 Nasal congestion: Secondary | ICD-10-CM | POA: Diagnosis not present

## 2021-02-10 DIAGNOSIS — F17210 Nicotine dependence, cigarettes, uncomplicated: Secondary | ICD-10-CM | POA: Diagnosis not present

## 2021-02-10 DIAGNOSIS — R0981 Nasal congestion: Secondary | ICD-10-CM | POA: Diagnosis not present

## 2021-02-10 DIAGNOSIS — R109 Unspecified abdominal pain: Secondary | ICD-10-CM | POA: Diagnosis not present

## 2021-02-10 DIAGNOSIS — Z20822 Contact with and (suspected) exposure to covid-19: Secondary | ICD-10-CM | POA: Diagnosis not present

## 2021-02-10 DIAGNOSIS — R519 Headache, unspecified: Secondary | ICD-10-CM | POA: Diagnosis not present

## 2021-02-10 DIAGNOSIS — R1031 Right lower quadrant pain: Secondary | ICD-10-CM | POA: Diagnosis not present

## 2021-02-10 DIAGNOSIS — R918 Other nonspecific abnormal finding of lung field: Secondary | ICD-10-CM | POA: Diagnosis not present

## 2021-02-10 DIAGNOSIS — R059 Cough, unspecified: Secondary | ICD-10-CM | POA: Diagnosis not present

## 2021-02-10 DIAGNOSIS — Z5329 Procedure and treatment not carried out because of patient's decision for other reasons: Secondary | ICD-10-CM | POA: Diagnosis not present

## 2021-02-10 DIAGNOSIS — I2510 Atherosclerotic heart disease of native coronary artery without angina pectoris: Secondary | ICD-10-CM | POA: Diagnosis not present

## 2021-02-10 DIAGNOSIS — J441 Chronic obstructive pulmonary disease with (acute) exacerbation: Secondary | ICD-10-CM | POA: Diagnosis not present

## 2021-02-10 DIAGNOSIS — E119 Type 2 diabetes mellitus without complications: Secondary | ICD-10-CM | POA: Diagnosis not present

## 2021-02-10 DIAGNOSIS — E785 Hyperlipidemia, unspecified: Secondary | ICD-10-CM | POA: Diagnosis not present

## 2021-02-11 ENCOUNTER — Telehealth: Payer: Self-pay

## 2021-02-11 NOTE — Telephone Encounter (Signed)
Transition Care Management Unsuccessful Follow-up Telephone Call  Date of discharge and from where:  02/10/2021 from [facility]  Attempts:  1st Attempt  Reason for unsuccessful TCM follow-up call:  Unable to leave message

## 2021-02-13 DIAGNOSIS — R9431 Abnormal electrocardiogram [ECG] [EKG]: Secondary | ICD-10-CM | POA: Diagnosis not present

## 2021-02-13 NOTE — Telephone Encounter (Signed)
Transition Care Management Unsuccessful Follow-up Telephone Call  Date of discharge and from where:  02/10/2021 from [facility]  Attempts:  2nd Attempt  Reason for unsuccessful TCM follow-up call:  Unable to leave message

## 2021-02-14 NOTE — Telephone Encounter (Signed)
Transition Care Management Unsuccessful Follow-up Telephone Call  Date of discharge and from where:  02/10/2021-[facility]  Attempts:  3rd Attempt  Reason for unsuccessful TCM follow-up call:  Unable to leave message

## 2021-02-21 DIAGNOSIS — Z419 Encounter for procedure for purposes other than remedying health state, unspecified: Secondary | ICD-10-CM | POA: Diagnosis not present

## 2021-02-28 IMAGING — CT CT ABDOMEN AND PELVIS WITH CONTRAST
2 of 5 series · 13 of 46 positions shown, 15 images · IV contrast (omnipaque)
Comparison: None.

CLINICAL DATA: Nausea and chronic SOB. Neutrophilia. Abnormal
weight loss.

EXAM:
CT CHEST, ABDOMEN, AND PELVIS WITH CONTRAST
TECHNIQUE: Multidetector CT imaging of the chest, abdomen and pelvis was
performed following the standard protocol during bolus
administration of intravenous contrast.
CONTRAST:  100mL OMNIPAQUE IOHEXOL 300 MG/ML  SOLN

[Series 2: axials cap · axial · 0.75mm/px · z∈[-1477,-927]mm · 10 of 134 slices shown, 12 images]
[im 12/134  soft-tissue]
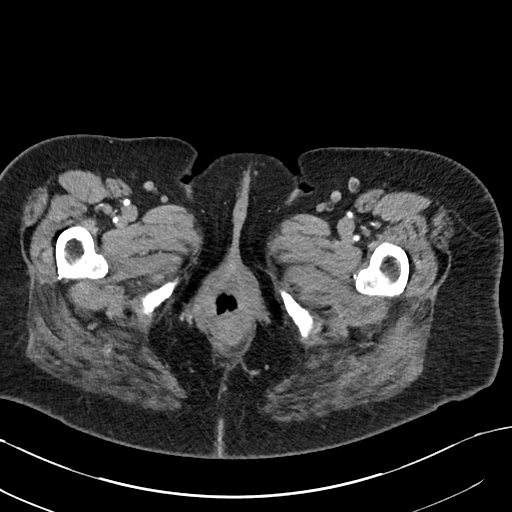
[im 12/134  bone]
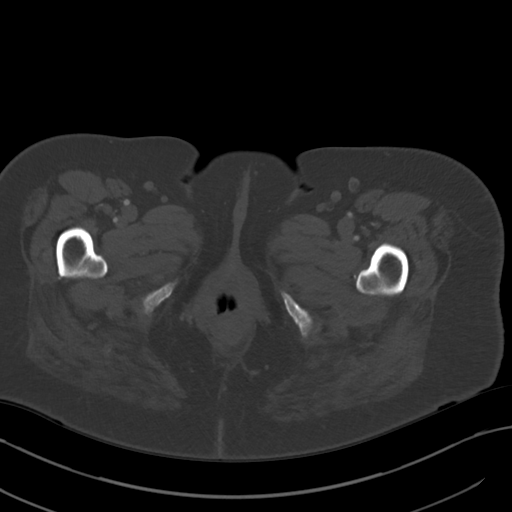
[im 23/134  soft-tissue]
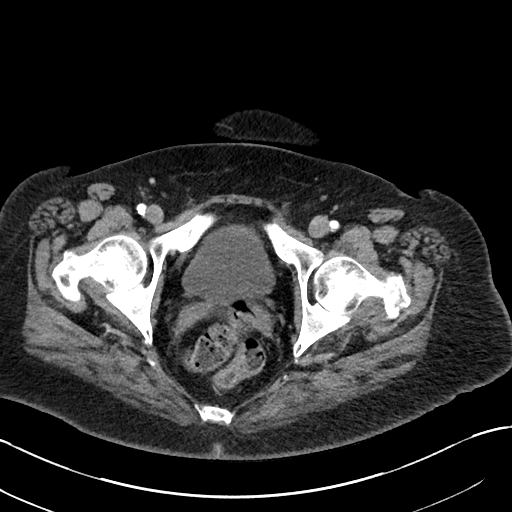
[im 34/134  soft-tissue]
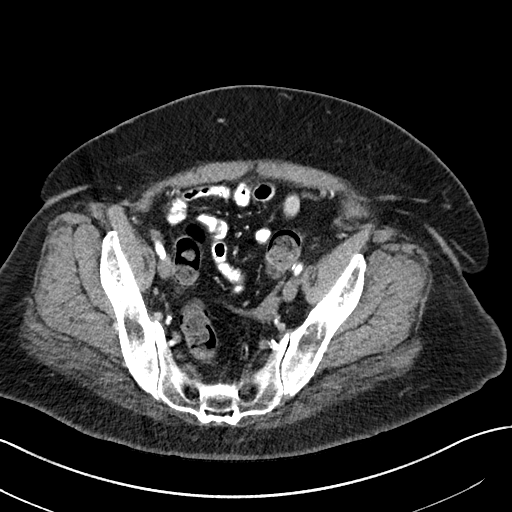
[im 45/134  soft-tissue]
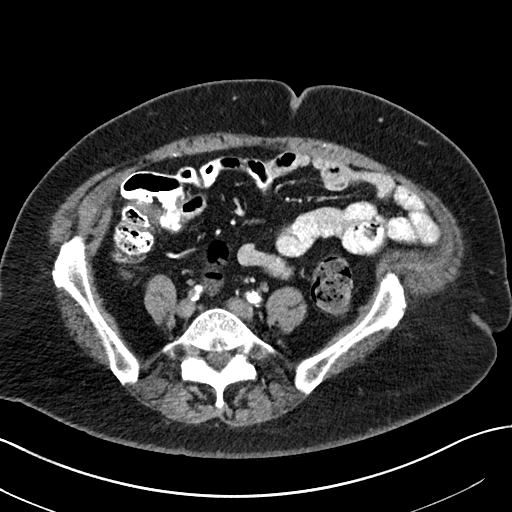
[im 56/134  soft-tissue]
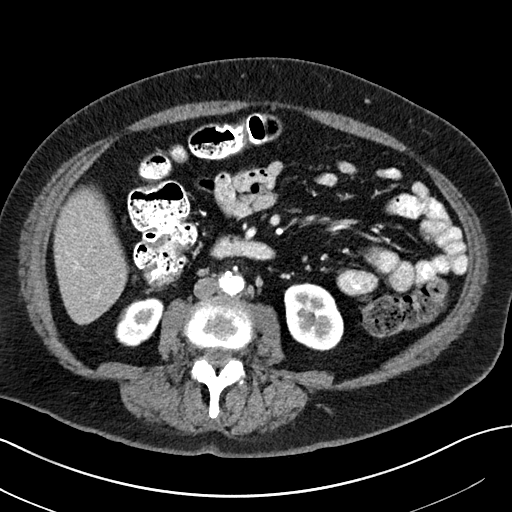
[im 78/134  soft-tissue]
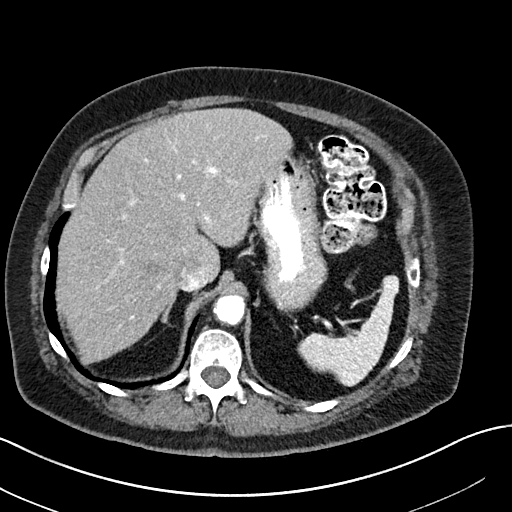
[im 89/134  soft-tissue]
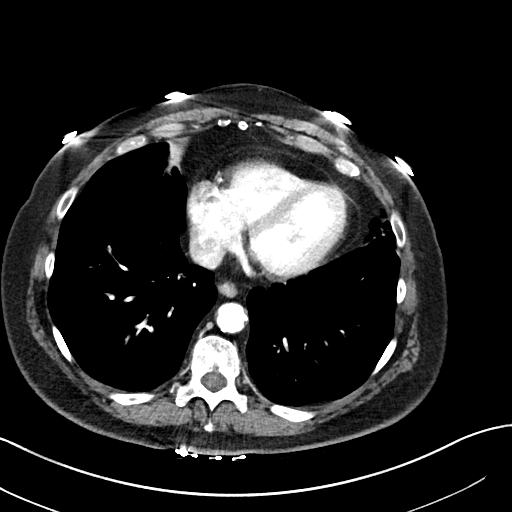
[im 100/134  soft-tissue]
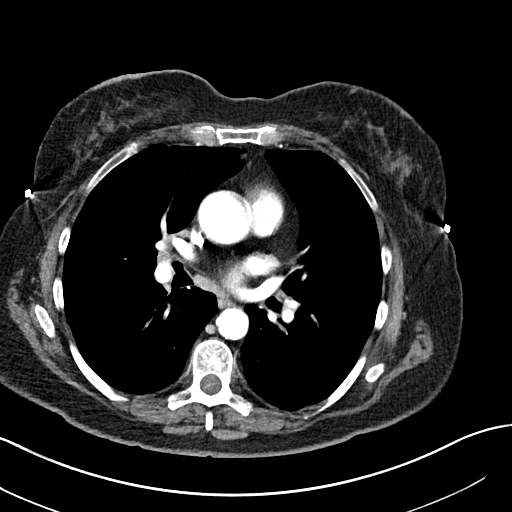
[im 111/134  soft-tissue]
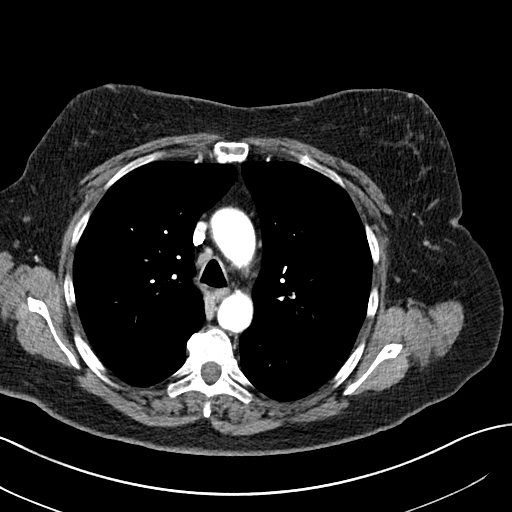
[im 111/134  bone]
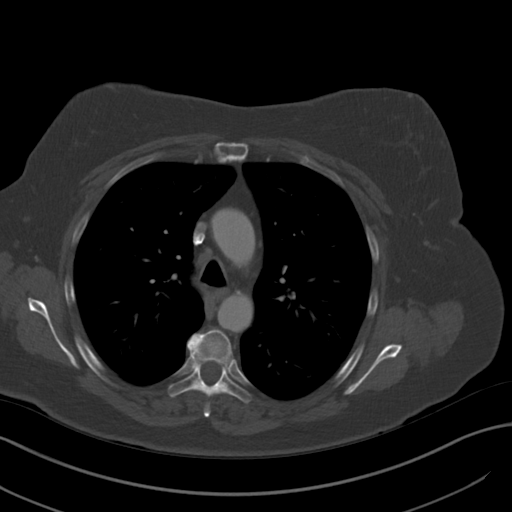
[im 122/134  soft-tissue]
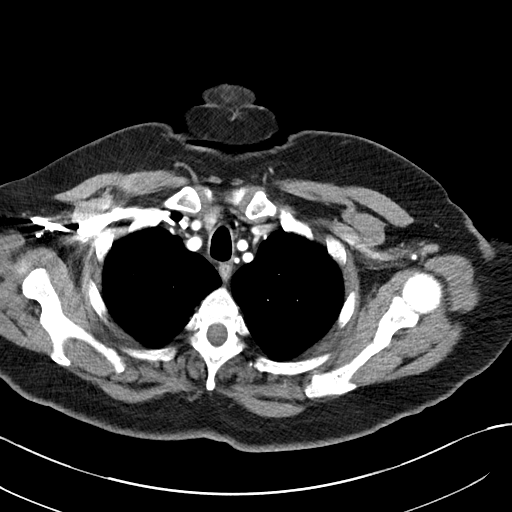

[Series 4: coronals cap · coronal · 0.75mm/px · 3 of 155 slices shown]
[im 52/155  soft-tissue]
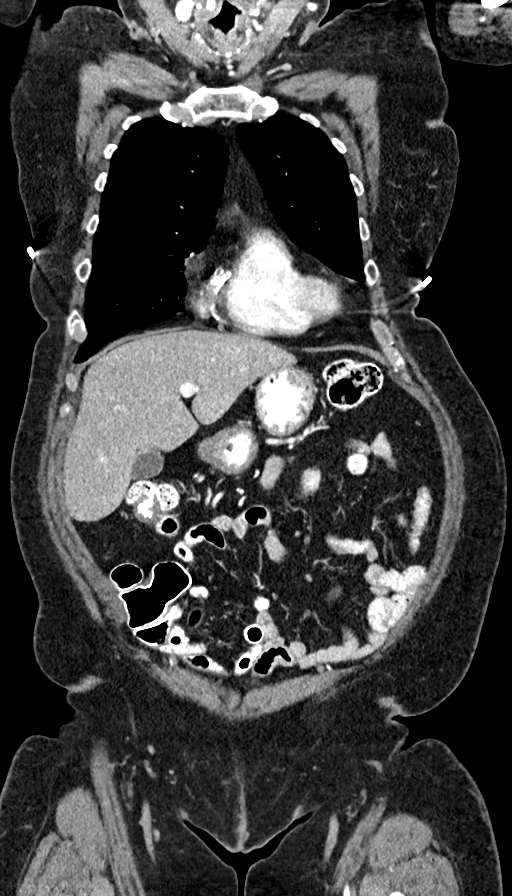
[im 69/155  soft-tissue]
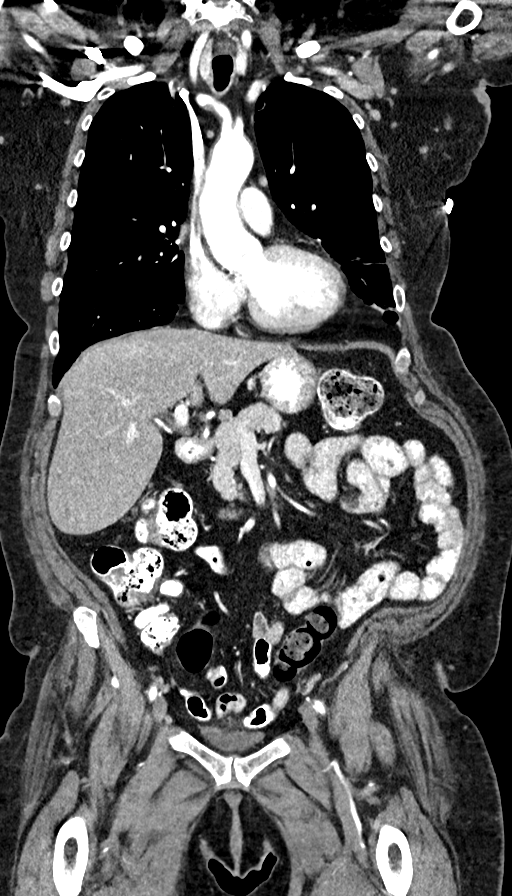
[im 86/155  soft-tissue]
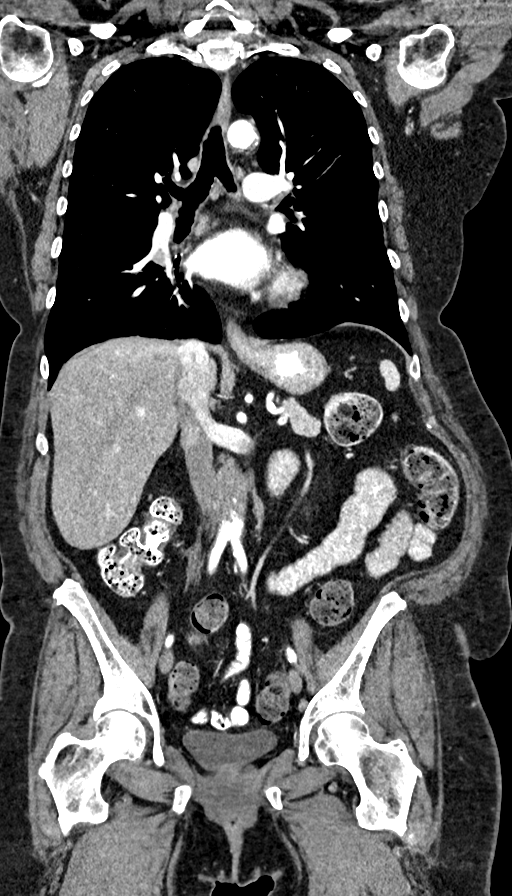

[13 of 46 positions shown; findings below may reference images not displayed]

FINDINGS: CT CHEST FINDINGS

Cardiovascular: Heart size appears normal. No pericardial effusion.
Aortic atherosclerosis. RCA coronary artery calcification.

Mediastinum/Nodes: Normal appearance of the thyroid gland. The
trachea appears patent and is midline. Normal appearance of the
esophagus. No enlarged mediastinal or hilar lymph nodes. No axillary
or supraclavicular adenopathy.

Lungs/Pleura: Centrilobular and paraseptal emphysema. There is a
diffuse mosaic attenuation pattern identified in both lungs. No
airspace consolidation, atelectasis or pneumothorax.

Musculoskeletal: No chest wall mass or suspicious bone lesions
identified.

CT ABDOMEN PELVIS FINDINGS

Hepatobiliary: No focal liver abnormality is seen. No gallstones,
gallbladder wall thickening, or biliary dilatation.

Pancreas: Unremarkable. No pancreatic ductal dilatation or
surrounding inflammatory changes.

Spleen: Normal in size without focal abnormality.

Adrenals/Urinary Tract: Normal right adrenal gland. Left adrenal
nodules are identified the measuring 1.1 cm and 1.3 cm, image 60/2.
These measure between 63 and 92 HU, indeterminate.

No kidney mass or hydronephrosis.  Bladder is unremarkable.

Stomach/Bowel: Stomach is within normal limits. No evidence of bowel
wall thickening, distention, or inflammatory changes.

Vascular/Lymphatic: Aortic atherosclerosis. No aneurysm. Infrarenal
abdominal aortic ectasia with extensive calcified and noncalcified
mural thrombus measures 2.8 cm. No abdominopelvic adenopathy.

Reproductive: Status post hysterectomy. No adnexal masses.

Other: No abdominal wall hernia or abnormality. No abdominopelvic
ascites.

Musculoskeletal: No acute or significant osseous findings.
IMPRESSION: 1. No acute findings. No mass or adenopathy identified within the
chest, abdomen or pelvis.
2. Indeterminate left adrenal nodules are identified measuring up to
1.3 cm. Advise further characterization with adrenal protocol MRI or
CT.
3. There is a diffuse mosaic attenuation pattern identified within
both lungs. This is a nonspecific finding but may be seen with small
airways disease as well as chronic thromboembolic disease. Clinical
correlation is advised. If further imaging is clinically indicated
consider high-resolution CT of the chest.
4. There are 2 small nonspecific nodules identified within the right
lung measuring 3 mm. No follow-up needed if patient is low-risk (and
has no known or suspected primary neoplasm). Non-contrast chest CT
can be considered in 12 months if patient is high-risk. This
recommendation follows the consensus statement: Guidelines for
Management of Incidental Pulmonary Nodules Detected on CT Images:
5. Aortic Atherosclerosis (9ZLN0-576.6) and Emphysema (9ZLN0-GUF.7).
Coronary artery calcifications
6. Infrarenal abdominal aortic ectasia. Ectatic abdominal aorta at
risk for aneurysm development. Recommend followup by ultrasound in 5
years. This recommendation follows ACR consensus guidelines: White
Paper of the ACR Incidental Findings Committee II on Vascular
Findings. [HOSPITAL] 6316; [DATE].

## 2021-02-28 IMAGING — CT CT CHEST WITH CONTRAST
1 series · 1 of 1 positions shown · IV contrast (omnipaque)
Comparison: None.

CLINICAL DATA: Nausea and chronic SOB. Neutrophilia. Abnormal
weight loss.

EXAM:
CT CHEST, ABDOMEN, AND PELVIS WITH CONTRAST
TECHNIQUE: Multidetector CT imaging of the chest, abdomen and pelvis was
performed following the standard protocol during bolus
administration of intravenous contrast.
CONTRAST:  100mL OMNIPAQUE IOHEXOL 300 MG/ML  SOLN

[Series 1: topogram · coronal · 1.50mm/px · 1 of 1 slices shown]
[im 1/1]
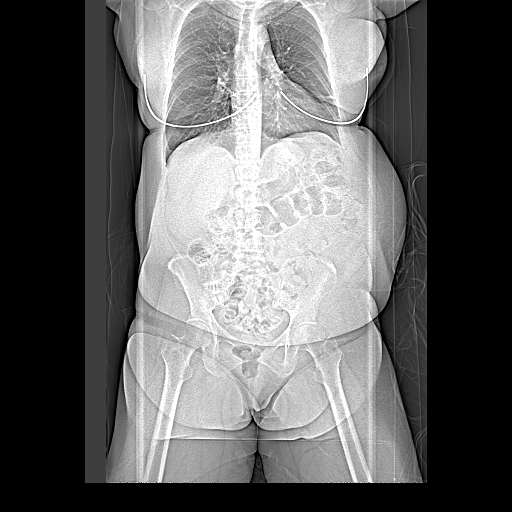

[1 of 1 positions shown; findings below may reference images not displayed]

FINDINGS: CT CHEST FINDINGS

Cardiovascular: Heart size appears normal. No pericardial effusion.
Aortic atherosclerosis. RCA coronary artery calcification.

Mediastinum/Nodes: Normal appearance of the thyroid gland. The
trachea appears patent and is midline. Normal appearance of the
esophagus. No enlarged mediastinal or hilar lymph nodes. No axillary
or supraclavicular adenopathy.

Lungs/Pleura: Centrilobular and paraseptal emphysema. There is a
diffuse mosaic attenuation pattern identified in both lungs. No
airspace consolidation, atelectasis or pneumothorax.

Musculoskeletal: No chest wall mass or suspicious bone lesions
identified.

CT ABDOMEN PELVIS FINDINGS

Hepatobiliary: No focal liver abnormality is seen. No gallstones,
gallbladder wall thickening, or biliary dilatation.

Pancreas: Unremarkable. No pancreatic ductal dilatation or
surrounding inflammatory changes.

Spleen: Normal in size without focal abnormality.

Adrenals/Urinary Tract: Normal right adrenal gland. Left adrenal
nodules are identified the measuring 1.1 cm and 1.3 cm, image 60/2.
These measure between 63 and 92 HU, indeterminate.

No kidney mass or hydronephrosis.  Bladder is unremarkable.

Stomach/Bowel: Stomach is within normal limits. No evidence of bowel
wall thickening, distention, or inflammatory changes.

Vascular/Lymphatic: Aortic atherosclerosis. No aneurysm. Infrarenal
abdominal aortic ectasia with extensive calcified and noncalcified
mural thrombus measures 2.8 cm. No abdominopelvic adenopathy.

Reproductive: Status post hysterectomy. No adnexal masses.

Other: No abdominal wall hernia or abnormality. No abdominopelvic
ascites.

Musculoskeletal: No acute or significant osseous findings.
IMPRESSION: 1. No acute findings. No mass or adenopathy identified within the
chest, abdomen or pelvis.
2. Indeterminate left adrenal nodules are identified measuring up to
1.3 cm. Advise further characterization with adrenal protocol MRI or
CT.
3. There is a diffuse mosaic attenuation pattern identified within
both lungs. This is a nonspecific finding but may be seen with small
airways disease as well as chronic thromboembolic disease. Clinical
correlation is advised. If further imaging is clinically indicated
consider high-resolution CT of the chest.
4. There are 2 small nonspecific nodules identified within the right
lung measuring 3 mm. No follow-up needed if patient is low-risk (and
has no known or suspected primary neoplasm). Non-contrast chest CT
can be considered in 12 months if patient is high-risk. This
recommendation follows the consensus statement: Guidelines for
Management of Incidental Pulmonary Nodules Detected on CT Images:
5. Aortic Atherosclerosis (9ZLN0-576.6) and Emphysema (9ZLN0-GUF.7).
Coronary artery calcifications
6. Infrarenal abdominal aortic ectasia. Ectatic abdominal aorta at
risk for aneurysm development. Recommend followup by ultrasound in 5
years. This recommendation follows ACR consensus guidelines: White
Paper of the ACR Incidental Findings Committee II on Vascular
Findings. [HOSPITAL] 6316; [DATE].

## 2021-03-02 DIAGNOSIS — J449 Chronic obstructive pulmonary disease, unspecified: Secondary | ICD-10-CM | POA: Diagnosis not present

## 2021-03-13 DIAGNOSIS — J441 Chronic obstructive pulmonary disease with (acute) exacerbation: Secondary | ICD-10-CM | POA: Diagnosis not present

## 2021-03-13 DIAGNOSIS — F419 Anxiety disorder, unspecified: Secondary | ICD-10-CM | POA: Diagnosis not present

## 2021-03-13 DIAGNOSIS — R519 Headache, unspecified: Secondary | ICD-10-CM | POA: Diagnosis not present

## 2021-03-13 DIAGNOSIS — R059 Cough, unspecified: Secondary | ICD-10-CM | POA: Diagnosis not present

## 2021-03-13 DIAGNOSIS — G4700 Insomnia, unspecified: Secondary | ICD-10-CM | POA: Diagnosis not present

## 2021-03-13 DIAGNOSIS — J189 Pneumonia, unspecified organism: Secondary | ICD-10-CM | POA: Diagnosis not present

## 2021-03-13 DIAGNOSIS — R0981 Nasal congestion: Secondary | ICD-10-CM | POA: Diagnosis not present

## 2021-03-23 DIAGNOSIS — Z419 Encounter for procedure for purposes other than remedying health state, unspecified: Secondary | ICD-10-CM | POA: Diagnosis not present

## 2021-04-01 DIAGNOSIS — J449 Chronic obstructive pulmonary disease, unspecified: Secondary | ICD-10-CM | POA: Diagnosis not present

## 2021-04-04 DIAGNOSIS — E119 Type 2 diabetes mellitus without complications: Secondary | ICD-10-CM | POA: Diagnosis not present

## 2021-04-23 DIAGNOSIS — Z419 Encounter for procedure for purposes other than remedying health state, unspecified: Secondary | ICD-10-CM | POA: Diagnosis not present

## 2021-05-02 DIAGNOSIS — J449 Chronic obstructive pulmonary disease, unspecified: Secondary | ICD-10-CM | POA: Diagnosis not present

## 2021-05-05 DIAGNOSIS — H5213 Myopia, bilateral: Secondary | ICD-10-CM | POA: Diagnosis not present

## 2021-05-24 DIAGNOSIS — Z419 Encounter for procedure for purposes other than remedying health state, unspecified: Secondary | ICD-10-CM | POA: Diagnosis not present

## 2021-06-02 DIAGNOSIS — J449 Chronic obstructive pulmonary disease, unspecified: Secondary | ICD-10-CM | POA: Diagnosis not present

## 2021-06-06 DIAGNOSIS — Z0130 Encounter for examination of blood pressure without abnormal findings: Secondary | ICD-10-CM | POA: Diagnosis not present

## 2021-06-06 DIAGNOSIS — M5412 Radiculopathy, cervical region: Secondary | ICD-10-CM | POA: Diagnosis not present

## 2021-06-06 DIAGNOSIS — M65311 Trigger thumb, right thumb: Secondary | ICD-10-CM | POA: Diagnosis not present

## 2021-06-06 DIAGNOSIS — M62838 Other muscle spasm: Secondary | ICD-10-CM | POA: Diagnosis not present

## 2021-06-19 DIAGNOSIS — M79644 Pain in right finger(s): Secondary | ICD-10-CM | POA: Diagnosis not present

## 2021-06-19 DIAGNOSIS — M65311 Trigger thumb, right thumb: Secondary | ICD-10-CM | POA: Diagnosis not present

## 2021-06-21 DIAGNOSIS — Z419 Encounter for procedure for purposes other than remedying health state, unspecified: Secondary | ICD-10-CM | POA: Diagnosis not present

## 2021-06-23 DIAGNOSIS — M5412 Radiculopathy, cervical region: Secondary | ICD-10-CM | POA: Diagnosis not present

## 2021-07-10 DIAGNOSIS — R079 Chest pain, unspecified: Secondary | ICD-10-CM | POA: Diagnosis not present

## 2021-07-10 DIAGNOSIS — I2510 Atherosclerotic heart disease of native coronary artery without angina pectoris: Secondary | ICD-10-CM | POA: Diagnosis not present

## 2021-07-10 DIAGNOSIS — I10 Essential (primary) hypertension: Secondary | ICD-10-CM | POA: Diagnosis not present

## 2021-07-10 DIAGNOSIS — J449 Chronic obstructive pulmonary disease, unspecified: Secondary | ICD-10-CM | POA: Diagnosis not present

## 2021-07-14 DIAGNOSIS — I10 Essential (primary) hypertension: Secondary | ICD-10-CM | POA: Diagnosis not present

## 2021-07-14 DIAGNOSIS — F17210 Nicotine dependence, cigarettes, uncomplicated: Secondary | ICD-10-CM | POA: Diagnosis not present

## 2021-07-14 DIAGNOSIS — E669 Obesity, unspecified: Secondary | ICD-10-CM | POA: Diagnosis not present

## 2021-07-14 DIAGNOSIS — G4733 Obstructive sleep apnea (adult) (pediatric): Secondary | ICD-10-CM | POA: Diagnosis not present

## 2021-07-14 DIAGNOSIS — Z8673 Personal history of transient ischemic attack (TIA), and cerebral infarction without residual deficits: Secondary | ICD-10-CM | POA: Diagnosis not present

## 2021-07-14 DIAGNOSIS — M65311 Trigger thumb, right thumb: Secondary | ICD-10-CM | POA: Diagnosis not present

## 2021-07-14 DIAGNOSIS — J449 Chronic obstructive pulmonary disease, unspecified: Secondary | ICD-10-CM | POA: Diagnosis not present

## 2021-07-14 DIAGNOSIS — I2510 Atherosclerotic heart disease of native coronary artery without angina pectoris: Secondary | ICD-10-CM | POA: Diagnosis not present

## 2021-07-14 DIAGNOSIS — K449 Diaphragmatic hernia without obstruction or gangrene: Secondary | ICD-10-CM | POA: Diagnosis not present

## 2021-07-14 DIAGNOSIS — Z6835 Body mass index (BMI) 35.0-35.9, adult: Secondary | ICD-10-CM | POA: Diagnosis not present

## 2021-07-14 DIAGNOSIS — E119 Type 2 diabetes mellitus without complications: Secondary | ICD-10-CM | POA: Diagnosis not present

## 2021-07-14 DIAGNOSIS — I252 Old myocardial infarction: Secondary | ICD-10-CM | POA: Diagnosis not present

## 2021-07-14 DIAGNOSIS — E785 Hyperlipidemia, unspecified: Secondary | ICD-10-CM | POA: Diagnosis not present

## 2021-07-14 DIAGNOSIS — I639 Cerebral infarction, unspecified: Secondary | ICD-10-CM | POA: Diagnosis not present

## 2021-07-19 DIAGNOSIS — Z0130 Encounter for examination of blood pressure without abnormal findings: Secondary | ICD-10-CM | POA: Diagnosis not present

## 2021-07-19 DIAGNOSIS — M5412 Radiculopathy, cervical region: Secondary | ICD-10-CM | POA: Diagnosis not present

## 2021-07-21 DIAGNOSIS — E669 Obesity, unspecified: Secondary | ICD-10-CM | POA: Diagnosis not present

## 2021-07-21 DIAGNOSIS — Z7984 Long term (current) use of oral hypoglycemic drugs: Secondary | ICD-10-CM | POA: Diagnosis not present

## 2021-07-21 DIAGNOSIS — R600 Localized edema: Secondary | ICD-10-CM | POA: Diagnosis not present

## 2021-07-21 DIAGNOSIS — Z79899 Other long term (current) drug therapy: Secondary | ICD-10-CM | POA: Diagnosis not present

## 2021-07-21 DIAGNOSIS — M25521 Pain in right elbow: Secondary | ICD-10-CM | POA: Diagnosis not present

## 2021-07-21 DIAGNOSIS — F17210 Nicotine dependence, cigarettes, uncomplicated: Secondary | ICD-10-CM | POA: Diagnosis not present

## 2021-07-21 DIAGNOSIS — I2510 Atherosclerotic heart disease of native coronary artery without angina pectoris: Secondary | ICD-10-CM | POA: Diagnosis not present

## 2021-07-21 DIAGNOSIS — Z7982 Long term (current) use of aspirin: Secondary | ICD-10-CM | POA: Diagnosis not present

## 2021-07-21 DIAGNOSIS — E119 Type 2 diabetes mellitus without complications: Secondary | ICD-10-CM | POA: Diagnosis not present

## 2021-07-21 DIAGNOSIS — R030 Elevated blood-pressure reading, without diagnosis of hypertension: Secondary | ICD-10-CM | POA: Diagnosis not present

## 2021-07-22 DIAGNOSIS — Z419 Encounter for procedure for purposes other than remedying health state, unspecified: Secondary | ICD-10-CM | POA: Diagnosis not present

## 2021-08-15 DIAGNOSIS — R053 Chronic cough: Secondary | ICD-10-CM | POA: Diagnosis not present

## 2021-08-15 DIAGNOSIS — R059 Cough, unspecified: Secondary | ICD-10-CM | POA: Diagnosis not present

## 2021-08-15 DIAGNOSIS — R0981 Nasal congestion: Secondary | ICD-10-CM | POA: Diagnosis not present

## 2021-08-15 DIAGNOSIS — Z0130 Encounter for examination of blood pressure without abnormal findings: Secondary | ICD-10-CM | POA: Diagnosis not present

## 2021-08-15 DIAGNOSIS — J189 Pneumonia, unspecified organism: Secondary | ICD-10-CM | POA: Diagnosis not present

## 2021-08-15 DIAGNOSIS — J441 Chronic obstructive pulmonary disease with (acute) exacerbation: Secondary | ICD-10-CM | POA: Diagnosis not present

## 2021-08-21 DIAGNOSIS — Z419 Encounter for procedure for purposes other than remedying health state, unspecified: Secondary | ICD-10-CM | POA: Diagnosis not present

## 2021-09-21 DIAGNOSIS — Z419 Encounter for procedure for purposes other than remedying health state, unspecified: Secondary | ICD-10-CM | POA: Diagnosis not present

## 2021-10-16 DIAGNOSIS — M5412 Radiculopathy, cervical region: Secondary | ICD-10-CM | POA: Diagnosis not present

## 2021-10-16 DIAGNOSIS — Z0130 Encounter for examination of blood pressure without abnormal findings: Secondary | ICD-10-CM | POA: Diagnosis not present

## 2021-10-16 DIAGNOSIS — E1122 Type 2 diabetes mellitus with diabetic chronic kidney disease: Secondary | ICD-10-CM | POA: Diagnosis not present

## 2021-10-16 DIAGNOSIS — J432 Centrilobular emphysema: Secondary | ICD-10-CM | POA: Diagnosis not present

## 2021-10-16 DIAGNOSIS — Z1231 Encounter for screening mammogram for malignant neoplasm of breast: Secondary | ICD-10-CM | POA: Diagnosis not present

## 2021-10-16 DIAGNOSIS — I2510 Atherosclerotic heart disease of native coronary artery without angina pectoris: Secondary | ICD-10-CM | POA: Diagnosis not present

## 2021-10-16 DIAGNOSIS — I10 Essential (primary) hypertension: Secondary | ICD-10-CM | POA: Diagnosis not present

## 2021-10-16 DIAGNOSIS — N1831 Chronic kidney disease, stage 3a: Secondary | ICD-10-CM | POA: Diagnosis not present

## 2021-10-16 DIAGNOSIS — F17210 Nicotine dependence, cigarettes, uncomplicated: Secondary | ICD-10-CM | POA: Diagnosis not present

## 2021-10-17 DIAGNOSIS — I480 Paroxysmal atrial fibrillation: Secondary | ICD-10-CM | POA: Diagnosis not present

## 2021-10-17 DIAGNOSIS — E119 Type 2 diabetes mellitus without complications: Secondary | ICD-10-CM | POA: Diagnosis not present

## 2021-10-17 DIAGNOSIS — I340 Nonrheumatic mitral (valve) insufficiency: Secondary | ICD-10-CM | POA: Diagnosis not present

## 2021-10-17 DIAGNOSIS — F419 Anxiety disorder, unspecified: Secondary | ICD-10-CM | POA: Diagnosis not present

## 2021-10-17 DIAGNOSIS — I351 Nonrheumatic aortic (valve) insufficiency: Secondary | ICD-10-CM | POA: Diagnosis not present

## 2021-10-17 DIAGNOSIS — I4892 Unspecified atrial flutter: Secondary | ICD-10-CM | POA: Diagnosis not present

## 2021-10-17 DIAGNOSIS — R112 Nausea with vomiting, unspecified: Secondary | ICD-10-CM | POA: Diagnosis not present

## 2021-10-17 DIAGNOSIS — R079 Chest pain, unspecified: Secondary | ICD-10-CM | POA: Diagnosis not present

## 2021-10-17 DIAGNOSIS — F17210 Nicotine dependence, cigarettes, uncomplicated: Secondary | ICD-10-CM | POA: Diagnosis not present

## 2021-10-17 DIAGNOSIS — I10 Essential (primary) hypertension: Secondary | ICD-10-CM | POA: Diagnosis not present

## 2021-10-17 DIAGNOSIS — I2510 Atherosclerotic heart disease of native coronary artery without angina pectoris: Secondary | ICD-10-CM | POA: Diagnosis not present

## 2021-10-17 DIAGNOSIS — Z6836 Body mass index (BMI) 36.0-36.9, adult: Secondary | ICD-10-CM | POA: Diagnosis not present

## 2021-10-17 DIAGNOSIS — T82855A Stenosis of coronary artery stent, initial encounter: Secondary | ICD-10-CM | POA: Diagnosis not present

## 2021-10-17 DIAGNOSIS — E785 Hyperlipidemia, unspecified: Secondary | ICD-10-CM | POA: Diagnosis not present

## 2021-10-17 DIAGNOSIS — Z955 Presence of coronary angioplasty implant and graft: Secondary | ICD-10-CM | POA: Diagnosis not present

## 2021-10-17 DIAGNOSIS — I252 Old myocardial infarction: Secondary | ICD-10-CM | POA: Diagnosis not present

## 2021-10-17 DIAGNOSIS — J432 Centrilobular emphysema: Secondary | ICD-10-CM | POA: Diagnosis not present

## 2021-10-17 DIAGNOSIS — I214 Non-ST elevation (NSTEMI) myocardial infarction: Secondary | ICD-10-CM | POA: Diagnosis not present

## 2021-10-17 DIAGNOSIS — Z7984 Long term (current) use of oral hypoglycemic drugs: Secondary | ICD-10-CM | POA: Diagnosis not present

## 2021-10-18 DIAGNOSIS — F17210 Nicotine dependence, cigarettes, uncomplicated: Secondary | ICD-10-CM | POA: Diagnosis not present

## 2021-10-18 DIAGNOSIS — E785 Hyperlipidemia, unspecified: Secondary | ICD-10-CM | POA: Diagnosis not present

## 2021-10-18 DIAGNOSIS — R079 Chest pain, unspecified: Secondary | ICD-10-CM | POA: Diagnosis not present

## 2021-10-18 DIAGNOSIS — I4892 Unspecified atrial flutter: Secondary | ICD-10-CM | POA: Diagnosis not present

## 2021-10-18 DIAGNOSIS — J432 Centrilobular emphysema: Secondary | ICD-10-CM | POA: Diagnosis not present

## 2021-10-18 DIAGNOSIS — I214 Non-ST elevation (NSTEMI) myocardial infarction: Secondary | ICD-10-CM | POA: Diagnosis not present

## 2021-10-18 DIAGNOSIS — F419 Anxiety disorder, unspecified: Secondary | ICD-10-CM | POA: Diagnosis not present

## 2021-10-18 DIAGNOSIS — Z955 Presence of coronary angioplasty implant and graft: Secondary | ICD-10-CM | POA: Diagnosis not present

## 2021-10-18 DIAGNOSIS — E119 Type 2 diabetes mellitus without complications: Secondary | ICD-10-CM | POA: Diagnosis not present

## 2021-10-18 DIAGNOSIS — I2510 Atherosclerotic heart disease of native coronary artery without angina pectoris: Secondary | ICD-10-CM | POA: Diagnosis not present

## 2021-10-18 DIAGNOSIS — Q245 Malformation of coronary vessels: Secondary | ICD-10-CM | POA: Diagnosis not present

## 2021-10-18 DIAGNOSIS — T82855A Stenosis of coronary artery stent, initial encounter: Secondary | ICD-10-CM | POA: Diagnosis not present

## 2021-10-18 DIAGNOSIS — I10 Essential (primary) hypertension: Secondary | ICD-10-CM | POA: Diagnosis not present

## 2021-10-19 DIAGNOSIS — I10 Essential (primary) hypertension: Secondary | ICD-10-CM | POA: Diagnosis not present

## 2021-10-19 DIAGNOSIS — I4892 Unspecified atrial flutter: Secondary | ICD-10-CM | POA: Diagnosis not present

## 2021-10-19 DIAGNOSIS — Q245 Malformation of coronary vessels: Secondary | ICD-10-CM | POA: Diagnosis not present

## 2021-10-19 DIAGNOSIS — R079 Chest pain, unspecified: Secondary | ICD-10-CM | POA: Diagnosis not present

## 2021-10-19 DIAGNOSIS — E119 Type 2 diabetes mellitus without complications: Secondary | ICD-10-CM | POA: Diagnosis not present

## 2021-10-19 DIAGNOSIS — I252 Old myocardial infarction: Secondary | ICD-10-CM | POA: Diagnosis not present

## 2021-10-19 DIAGNOSIS — I214 Non-ST elevation (NSTEMI) myocardial infarction: Secondary | ICD-10-CM | POA: Diagnosis not present

## 2021-10-19 DIAGNOSIS — I4891 Unspecified atrial fibrillation: Secondary | ICD-10-CM | POA: Diagnosis not present

## 2021-10-19 DIAGNOSIS — I25119 Atherosclerotic heart disease of native coronary artery with unspecified angina pectoris: Secondary | ICD-10-CM | POA: Diagnosis not present

## 2021-10-19 DIAGNOSIS — Z955 Presence of coronary angioplasty implant and graft: Secondary | ICD-10-CM | POA: Diagnosis not present

## 2021-10-19 DIAGNOSIS — E785 Hyperlipidemia, unspecified: Secondary | ICD-10-CM | POA: Diagnosis not present

## 2021-10-19 DIAGNOSIS — J432 Centrilobular emphysema: Secondary | ICD-10-CM | POA: Diagnosis not present

## 2021-10-19 DIAGNOSIS — I2510 Atherosclerotic heart disease of native coronary artery without angina pectoris: Secondary | ICD-10-CM | POA: Diagnosis not present

## 2021-10-19 DIAGNOSIS — F17210 Nicotine dependence, cigarettes, uncomplicated: Secondary | ICD-10-CM | POA: Diagnosis not present

## 2021-10-21 DIAGNOSIS — Z419 Encounter for procedure for purposes other than remedying health state, unspecified: Secondary | ICD-10-CM | POA: Diagnosis not present

## 2021-11-01 DIAGNOSIS — I4892 Unspecified atrial flutter: Secondary | ICD-10-CM | POA: Diagnosis not present

## 2021-11-01 DIAGNOSIS — I214 Non-ST elevation (NSTEMI) myocardial infarction: Secondary | ICD-10-CM | POA: Diagnosis not present

## 2021-11-21 DIAGNOSIS — Z419 Encounter for procedure for purposes other than remedying health state, unspecified: Secondary | ICD-10-CM | POA: Diagnosis not present

## 2021-11-23 DIAGNOSIS — I483 Typical atrial flutter: Secondary | ICD-10-CM | POA: Diagnosis not present

## 2021-11-23 DIAGNOSIS — I2510 Atherosclerotic heart disease of native coronary artery without angina pectoris: Secondary | ICD-10-CM | POA: Diagnosis not present

## 2021-11-23 DIAGNOSIS — E1169 Type 2 diabetes mellitus with other specified complication: Secondary | ICD-10-CM | POA: Diagnosis not present

## 2021-11-23 DIAGNOSIS — I10 Essential (primary) hypertension: Secondary | ICD-10-CM | POA: Diagnosis not present

## 2021-11-23 DIAGNOSIS — E785 Hyperlipidemia, unspecified: Secondary | ICD-10-CM | POA: Diagnosis not present

## 2021-11-29 DIAGNOSIS — E785 Hyperlipidemia, unspecified: Secondary | ICD-10-CM | POA: Diagnosis not present

## 2021-11-29 DIAGNOSIS — E1169 Type 2 diabetes mellitus with other specified complication: Secondary | ICD-10-CM | POA: Diagnosis not present

## 2021-11-29 DIAGNOSIS — I483 Typical atrial flutter: Secondary | ICD-10-CM | POA: Diagnosis not present

## 2021-11-29 DIAGNOSIS — I10 Essential (primary) hypertension: Secondary | ICD-10-CM | POA: Diagnosis not present

## 2021-11-29 DIAGNOSIS — I2510 Atherosclerotic heart disease of native coronary artery without angina pectoris: Secondary | ICD-10-CM | POA: Diagnosis not present

## 2021-12-13 DIAGNOSIS — I200 Unstable angina: Secondary | ICD-10-CM | POA: Diagnosis not present

## 2021-12-13 DIAGNOSIS — Q245 Malformation of coronary vessels: Secondary | ICD-10-CM | POA: Diagnosis not present

## 2021-12-13 DIAGNOSIS — T82855A Stenosis of coronary artery stent, initial encounter: Secondary | ICD-10-CM | POA: Diagnosis not present

## 2021-12-13 DIAGNOSIS — Z955 Presence of coronary angioplasty implant and graft: Secondary | ICD-10-CM | POA: Diagnosis not present

## 2021-12-18 DIAGNOSIS — I200 Unstable angina: Secondary | ICD-10-CM | POA: Diagnosis not present

## 2021-12-18 DIAGNOSIS — Q245 Malformation of coronary vessels: Secondary | ICD-10-CM | POA: Diagnosis not present

## 2021-12-18 DIAGNOSIS — Z955 Presence of coronary angioplasty implant and graft: Secondary | ICD-10-CM | POA: Diagnosis not present

## 2021-12-18 DIAGNOSIS — T82855A Stenosis of coronary artery stent, initial encounter: Secondary | ICD-10-CM | POA: Diagnosis not present

## 2021-12-21 DIAGNOSIS — Z87891 Personal history of nicotine dependence: Secondary | ICD-10-CM | POA: Diagnosis not present

## 2021-12-21 DIAGNOSIS — I2510 Atherosclerotic heart disease of native coronary artery without angina pectoris: Secondary | ICD-10-CM | POA: Diagnosis not present

## 2021-12-21 DIAGNOSIS — J432 Centrilobular emphysema: Secondary | ICD-10-CM | POA: Diagnosis not present

## 2021-12-22 DIAGNOSIS — Z419 Encounter for procedure for purposes other than remedying health state, unspecified: Secondary | ICD-10-CM | POA: Diagnosis not present

## 2021-12-27 DIAGNOSIS — M5412 Radiculopathy, cervical region: Secondary | ICD-10-CM | POA: Diagnosis not present

## 2021-12-27 DIAGNOSIS — E1122 Type 2 diabetes mellitus with diabetic chronic kidney disease: Secondary | ICD-10-CM | POA: Diagnosis not present

## 2021-12-27 DIAGNOSIS — E559 Vitamin D deficiency, unspecified: Secondary | ICD-10-CM | POA: Diagnosis not present

## 2021-12-27 DIAGNOSIS — N1831 Chronic kidney disease, stage 3a: Secondary | ICD-10-CM | POA: Diagnosis not present

## 2021-12-27 DIAGNOSIS — J449 Chronic obstructive pulmonary disease, unspecified: Secondary | ICD-10-CM | POA: Diagnosis not present

## 2021-12-27 DIAGNOSIS — Z1231 Encounter for screening mammogram for malignant neoplasm of breast: Secondary | ICD-10-CM | POA: Diagnosis not present

## 2021-12-27 DIAGNOSIS — I483 Typical atrial flutter: Secondary | ICD-10-CM | POA: Diagnosis not present

## 2021-12-27 DIAGNOSIS — I2510 Atherosclerotic heart disease of native coronary artery without angina pectoris: Secondary | ICD-10-CM | POA: Diagnosis not present

## 2021-12-27 DIAGNOSIS — I480 Paroxysmal atrial fibrillation: Secondary | ICD-10-CM | POA: Diagnosis not present

## 2021-12-27 DIAGNOSIS — I10 Essential (primary) hypertension: Secondary | ICD-10-CM | POA: Diagnosis not present

## 2021-12-27 DIAGNOSIS — Z955 Presence of coronary angioplasty implant and graft: Secondary | ICD-10-CM | POA: Diagnosis not present

## 2021-12-29 DIAGNOSIS — Z955 Presence of coronary angioplasty implant and graft: Secondary | ICD-10-CM | POA: Diagnosis not present

## 2021-12-29 DIAGNOSIS — Q245 Malformation of coronary vessels: Secondary | ICD-10-CM | POA: Diagnosis not present

## 2021-12-29 DIAGNOSIS — I200 Unstable angina: Secondary | ICD-10-CM | POA: Diagnosis not present

## 2021-12-29 DIAGNOSIS — T82855A Stenosis of coronary artery stent, initial encounter: Secondary | ICD-10-CM | POA: Diagnosis not present

## 2022-01-01 DIAGNOSIS — T82855A Stenosis of coronary artery stent, initial encounter: Secondary | ICD-10-CM | POA: Diagnosis not present

## 2022-01-01 DIAGNOSIS — Q245 Malformation of coronary vessels: Secondary | ICD-10-CM | POA: Diagnosis not present

## 2022-01-01 DIAGNOSIS — I200 Unstable angina: Secondary | ICD-10-CM | POA: Diagnosis not present

## 2022-01-01 DIAGNOSIS — Z955 Presence of coronary angioplasty implant and graft: Secondary | ICD-10-CM | POA: Diagnosis not present

## 2022-01-03 DIAGNOSIS — T82855A Stenosis of coronary artery stent, initial encounter: Secondary | ICD-10-CM | POA: Diagnosis not present

## 2022-01-03 DIAGNOSIS — I200 Unstable angina: Secondary | ICD-10-CM | POA: Diagnosis not present

## 2022-01-03 DIAGNOSIS — Q245 Malformation of coronary vessels: Secondary | ICD-10-CM | POA: Diagnosis not present

## 2022-01-03 DIAGNOSIS — Z955 Presence of coronary angioplasty implant and graft: Secondary | ICD-10-CM | POA: Diagnosis not present

## 2022-01-04 DIAGNOSIS — E785 Hyperlipidemia, unspecified: Secondary | ICD-10-CM | POA: Diagnosis not present

## 2022-01-04 DIAGNOSIS — E1169 Type 2 diabetes mellitus with other specified complication: Secondary | ICD-10-CM | POA: Diagnosis not present

## 2022-01-04 DIAGNOSIS — I10 Essential (primary) hypertension: Secondary | ICD-10-CM | POA: Diagnosis not present

## 2022-01-04 DIAGNOSIS — I483 Typical atrial flutter: Secondary | ICD-10-CM | POA: Diagnosis not present

## 2022-01-04 DIAGNOSIS — I2510 Atherosclerotic heart disease of native coronary artery without angina pectoris: Secondary | ICD-10-CM | POA: Diagnosis not present

## 2022-01-21 DIAGNOSIS — Z419 Encounter for procedure for purposes other than remedying health state, unspecified: Secondary | ICD-10-CM | POA: Diagnosis not present

## 2022-02-01 DIAGNOSIS — J449 Chronic obstructive pulmonary disease, unspecified: Secondary | ICD-10-CM | POA: Diagnosis not present

## 2022-02-09 DIAGNOSIS — R059 Cough, unspecified: Secondary | ICD-10-CM | POA: Diagnosis not present

## 2022-02-14 DIAGNOSIS — I483 Typical atrial flutter: Secondary | ICD-10-CM | POA: Diagnosis not present

## 2022-02-14 DIAGNOSIS — E785 Hyperlipidemia, unspecified: Secondary | ICD-10-CM | POA: Diagnosis not present

## 2022-02-14 DIAGNOSIS — I2510 Atherosclerotic heart disease of native coronary artery without angina pectoris: Secondary | ICD-10-CM | POA: Diagnosis not present

## 2022-02-14 DIAGNOSIS — E1169 Type 2 diabetes mellitus with other specified complication: Secondary | ICD-10-CM | POA: Diagnosis not present

## 2022-02-14 DIAGNOSIS — G4710 Hypersomnia, unspecified: Secondary | ICD-10-CM | POA: Diagnosis not present

## 2022-02-14 DIAGNOSIS — I10 Essential (primary) hypertension: Secondary | ICD-10-CM | POA: Diagnosis not present

## 2022-02-21 DIAGNOSIS — Z419 Encounter for procedure for purposes other than remedying health state, unspecified: Secondary | ICD-10-CM | POA: Diagnosis not present

## 2022-03-02 DIAGNOSIS — J432 Centrilobular emphysema: Secondary | ICD-10-CM | POA: Diagnosis not present

## 2022-03-02 DIAGNOSIS — Z87891 Personal history of nicotine dependence: Secondary | ICD-10-CM | POA: Diagnosis not present

## 2022-03-02 DIAGNOSIS — R0600 Dyspnea, unspecified: Secondary | ICD-10-CM | POA: Diagnosis not present

## 2022-03-02 DIAGNOSIS — G4710 Hypersomnia, unspecified: Secondary | ICD-10-CM | POA: Diagnosis not present

## 2022-03-04 DIAGNOSIS — J449 Chronic obstructive pulmonary disease, unspecified: Secondary | ICD-10-CM | POA: Diagnosis not present

## 2022-03-23 DIAGNOSIS — Z419 Encounter for procedure for purposes other than remedying health state, unspecified: Secondary | ICD-10-CM | POA: Diagnosis not present

## 2022-03-29 DIAGNOSIS — M1990 Unspecified osteoarthritis, unspecified site: Secondary | ICD-10-CM | POA: Diagnosis not present

## 2022-03-29 DIAGNOSIS — M5412 Radiculopathy, cervical region: Secondary | ICD-10-CM | POA: Diagnosis not present

## 2022-03-29 DIAGNOSIS — J441 Chronic obstructive pulmonary disease with (acute) exacerbation: Secondary | ICD-10-CM | POA: Diagnosis not present

## 2022-03-29 DIAGNOSIS — G8929 Other chronic pain: Secondary | ICD-10-CM | POA: Diagnosis not present

## 2022-03-29 DIAGNOSIS — J069 Acute upper respiratory infection, unspecified: Secondary | ICD-10-CM | POA: Diagnosis not present

## 2022-04-03 DIAGNOSIS — J449 Chronic obstructive pulmonary disease, unspecified: Secondary | ICD-10-CM | POA: Diagnosis not present

## 2022-04-23 DIAGNOSIS — Z419 Encounter for procedure for purposes other than remedying health state, unspecified: Secondary | ICD-10-CM | POA: Diagnosis not present

## 2022-04-30 DIAGNOSIS — R197 Diarrhea, unspecified: Secondary | ICD-10-CM | POA: Diagnosis not present

## 2022-04-30 DIAGNOSIS — N1831 Chronic kidney disease, stage 3a: Secondary | ICD-10-CM | POA: Diagnosis not present

## 2022-04-30 DIAGNOSIS — R112 Nausea with vomiting, unspecified: Secondary | ICD-10-CM | POA: Diagnosis not present

## 2022-04-30 DIAGNOSIS — E1122 Type 2 diabetes mellitus with diabetic chronic kidney disease: Secondary | ICD-10-CM | POA: Diagnosis not present

## 2022-05-04 DIAGNOSIS — J449 Chronic obstructive pulmonary disease, unspecified: Secondary | ICD-10-CM | POA: Diagnosis not present

## 2022-05-24 DIAGNOSIS — Z419 Encounter for procedure for purposes other than remedying health state, unspecified: Secondary | ICD-10-CM | POA: Diagnosis not present

## 2022-05-29 DIAGNOSIS — R0981 Nasal congestion: Secondary | ICD-10-CM | POA: Diagnosis not present

## 2022-05-29 DIAGNOSIS — J029 Acute pharyngitis, unspecified: Secondary | ICD-10-CM | POA: Diagnosis not present

## 2022-05-29 DIAGNOSIS — R519 Headache, unspecified: Secondary | ICD-10-CM | POA: Diagnosis not present

## 2022-05-29 DIAGNOSIS — R509 Fever, unspecified: Secondary | ICD-10-CM | POA: Diagnosis not present

## 2022-05-29 DIAGNOSIS — M5412 Radiculopathy, cervical region: Secondary | ICD-10-CM | POA: Diagnosis not present

## 2022-06-01 DIAGNOSIS — R059 Cough, unspecified: Secondary | ICD-10-CM | POA: Diagnosis not present

## 2022-06-01 DIAGNOSIS — H6693 Otitis media, unspecified, bilateral: Secondary | ICD-10-CM | POA: Diagnosis not present

## 2022-06-04 DIAGNOSIS — J449 Chronic obstructive pulmonary disease, unspecified: Secondary | ICD-10-CM | POA: Diagnosis not present

## 2022-06-05 DIAGNOSIS — H9313 Tinnitus, bilateral: Secondary | ICD-10-CM | POA: Diagnosis not present

## 2022-06-05 DIAGNOSIS — E1122 Type 2 diabetes mellitus with diabetic chronic kidney disease: Secondary | ICD-10-CM | POA: Diagnosis not present

## 2022-06-05 DIAGNOSIS — Z1211 Encounter for screening for malignant neoplasm of colon: Secondary | ICD-10-CM | POA: Diagnosis not present

## 2022-06-05 DIAGNOSIS — H6593 Unspecified nonsuppurative otitis media, bilateral: Secondary | ICD-10-CM | POA: Diagnosis not present

## 2022-06-05 DIAGNOSIS — N1831 Chronic kidney disease, stage 3a: Secondary | ICD-10-CM | POA: Diagnosis not present

## 2022-06-05 DIAGNOSIS — R600 Localized edema: Secondary | ICD-10-CM | POA: Diagnosis not present

## 2022-06-19 DIAGNOSIS — E559 Vitamin D deficiency, unspecified: Secondary | ICD-10-CM | POA: Diagnosis not present

## 2022-06-19 DIAGNOSIS — H9190 Unspecified hearing loss, unspecified ear: Secondary | ICD-10-CM | POA: Diagnosis not present

## 2022-06-22 DIAGNOSIS — Z419 Encounter for procedure for purposes other than remedying health state, unspecified: Secondary | ICD-10-CM | POA: Diagnosis not present

## 2022-07-23 DIAGNOSIS — Z419 Encounter for procedure for purposes other than remedying health state, unspecified: Secondary | ICD-10-CM | POA: Diagnosis not present

## 2022-08-03 DIAGNOSIS — J449 Chronic obstructive pulmonary disease, unspecified: Secondary | ICD-10-CM | POA: Diagnosis not present

## 2022-08-21 DIAGNOSIS — I2510 Atherosclerotic heart disease of native coronary artery without angina pectoris: Secondary | ICD-10-CM | POA: Diagnosis not present

## 2022-08-21 DIAGNOSIS — I483 Typical atrial flutter: Secondary | ICD-10-CM | POA: Diagnosis not present

## 2022-08-21 DIAGNOSIS — E1169 Type 2 diabetes mellitus with other specified complication: Secondary | ICD-10-CM | POA: Diagnosis not present

## 2022-08-21 DIAGNOSIS — G4710 Hypersomnia, unspecified: Secondary | ICD-10-CM | POA: Diagnosis not present

## 2022-08-21 DIAGNOSIS — I10 Essential (primary) hypertension: Secondary | ICD-10-CM | POA: Diagnosis not present

## 2022-08-21 DIAGNOSIS — E785 Hyperlipidemia, unspecified: Secondary | ICD-10-CM | POA: Diagnosis not present

## 2022-08-22 DIAGNOSIS — Z419 Encounter for procedure for purposes other than remedying health state, unspecified: Secondary | ICD-10-CM | POA: Diagnosis not present

## 2022-09-02 DIAGNOSIS — J449 Chronic obstructive pulmonary disease, unspecified: Secondary | ICD-10-CM | POA: Diagnosis not present

## 2022-09-13 DIAGNOSIS — N1831 Chronic kidney disease, stage 3a: Secondary | ICD-10-CM | POA: Diagnosis not present

## 2022-09-13 DIAGNOSIS — B351 Tinea unguium: Secondary | ICD-10-CM | POA: Diagnosis not present

## 2022-09-13 DIAGNOSIS — E1122 Type 2 diabetes mellitus with diabetic chronic kidney disease: Secondary | ICD-10-CM | POA: Diagnosis not present

## 2022-09-13 DIAGNOSIS — E119 Type 2 diabetes mellitus without complications: Secondary | ICD-10-CM | POA: Diagnosis not present

## 2022-09-13 DIAGNOSIS — E559 Vitamin D deficiency, unspecified: Secondary | ICD-10-CM | POA: Diagnosis not present

## 2022-09-13 DIAGNOSIS — R609 Edema, unspecified: Secondary | ICD-10-CM | POA: Diagnosis not present

## 2022-09-20 DIAGNOSIS — Z7984 Long term (current) use of oral hypoglycemic drugs: Secondary | ICD-10-CM | POA: Diagnosis not present

## 2022-09-20 DIAGNOSIS — Z7982 Long term (current) use of aspirin: Secondary | ICD-10-CM | POA: Diagnosis not present

## 2022-09-20 DIAGNOSIS — E119 Type 2 diabetes mellitus without complications: Secondary | ICD-10-CM | POA: Diagnosis not present

## 2022-09-20 DIAGNOSIS — S93505A Unspecified sprain of left lesser toe(s), initial encounter: Secondary | ICD-10-CM | POA: Diagnosis not present

## 2022-09-20 DIAGNOSIS — Z7901 Long term (current) use of anticoagulants: Secondary | ICD-10-CM | POA: Diagnosis not present

## 2022-09-20 DIAGNOSIS — M7989 Other specified soft tissue disorders: Secondary | ICD-10-CM | POA: Diagnosis not present

## 2022-09-20 DIAGNOSIS — S99922A Unspecified injury of left foot, initial encounter: Secondary | ICD-10-CM | POA: Diagnosis not present

## 2022-09-20 DIAGNOSIS — F17210 Nicotine dependence, cigarettes, uncomplicated: Secondary | ICD-10-CM | POA: Diagnosis not present

## 2022-09-22 DIAGNOSIS — Z419 Encounter for procedure for purposes other than remedying health state, unspecified: Secondary | ICD-10-CM | POA: Diagnosis not present

## 2022-10-03 DIAGNOSIS — J449 Chronic obstructive pulmonary disease, unspecified: Secondary | ICD-10-CM | POA: Diagnosis not present

## 2022-10-22 DIAGNOSIS — Z419 Encounter for procedure for purposes other than remedying health state, unspecified: Secondary | ICD-10-CM | POA: Diagnosis not present

## 2022-11-02 DIAGNOSIS — J449 Chronic obstructive pulmonary disease, unspecified: Secondary | ICD-10-CM | POA: Diagnosis not present

## 2022-11-09 DIAGNOSIS — M5412 Radiculopathy, cervical region: Secondary | ICD-10-CM | POA: Diagnosis not present

## 2022-11-09 DIAGNOSIS — N393 Stress incontinence (female) (male): Secondary | ICD-10-CM | POA: Diagnosis not present

## 2022-11-22 DIAGNOSIS — Z419 Encounter for procedure for purposes other than remedying health state, unspecified: Secondary | ICD-10-CM | POA: Diagnosis not present

## 2022-12-03 DIAGNOSIS — J449 Chronic obstructive pulmonary disease, unspecified: Secondary | ICD-10-CM | POA: Diagnosis not present

## 2022-12-23 DIAGNOSIS — Z419 Encounter for procedure for purposes other than remedying health state, unspecified: Secondary | ICD-10-CM | POA: Diagnosis not present

## 2022-12-27 DIAGNOSIS — R32 Unspecified urinary incontinence: Secondary | ICD-10-CM | POA: Diagnosis not present

## 2023-01-03 DIAGNOSIS — J449 Chronic obstructive pulmonary disease, unspecified: Secondary | ICD-10-CM | POA: Diagnosis not present

## 2023-01-27 DIAGNOSIS — R32 Unspecified urinary incontinence: Secondary | ICD-10-CM | POA: Diagnosis not present

## 2024-01-28 NOTE — Progress Notes (Signed)
 Assessment & Plan:  1. Shortness of breath (Primary)  2. 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 Anxiety - Ambulatory referral to Behavioral Health  4. Tobacco dependence due to cigarettes  5. Obesity, Class I, BMI 30.0-34.9 (see actual BMI)   Patient Instructions  1.  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  There is no medication to help with this.  2.  We have referred you to behavioral health for your issues with insomnia.  3.  We have schedule breathing tests.  4.  We will see you in follow-up after the breathing tests are done.  5.  It is highly recommended that you stop smoking.  6.  Thank you for trusting us  with your care.  Please note: late entry documentation due to logistical difficulties during COVID-19 pandemic. This note is filed for information purposes only, and is not intended to be used for billing, nor does it represent the full scope/nature of the visit in question. Please see any associated scanned media linked to date of encounter for additional pertinent information.  Subjective:    HPI: Barbara Bowen is a 68 y.o. female presenting to the pulmonology clinic on 02/04/2019 with report of: pulmonary consult (CT 12/08/2018-c/o sob with exertion and non prod cough. )     Outpatient Encounter Medications as of 02/04/2019  Medication Sig   albuterol  (PROVENTIL  HFA;VENTOLIN  HFA) 108 (90 Base) MCG/ACT inhaler TAKE 2 PUFFS BY MOUTH EVERY 6 HOURS AS NEEDED   aspirin  EC 81 MG tablet Take 1 tablet (81 mg total) by mouth daily. (Patient taking differently: Take 162 mg by mouth daily. )   BD ULTRA-FINE LANCETS lancets Use as instructed   busPIRone  (BUSPAR ) 5 MG tablet Take 1 tablet (5 mg total) by mouth 3 (three) times daily.   clopidogrel  (PLAVIX ) 75 MG tablet TAKE 1 TABLET (75 MG TOTAL) BY MOUTH DAILY.   glucose blood  (ONE TOUCH ULTRA TEST) test strip Use as instructed   hydrOXYzine  (ATARAX /VISTARIL ) 10 MG tablet Take 1 tablet (10 mg total) by mouth every 8 (eight) hours as needed for anxiety.   metFORMIN  (GLUCOPHAGE -XR) 500 MG 24 hr tablet Take 3 tablets (1,500 mg total) by mouth daily before breakfast.   nitroGLYCERIN  (NITRO-DUR ) 0.2 mg/hr patch Place 1 patch (0.2 mg total) onto the skin daily.   nitroGLYCERIN  (NITROSTAT ) 0.4 MG SL tablet Place 1 tablet (0.4 mg total) under the tongue every 5 (five) minutes as needed.   OXYGEN  Inhale 2.5 L into the lungs as needed.   rosuvastatin  (CRESTOR ) 40 MG tablet Take 1 tablet (40 mg total) by mouth daily.   [DISCONTINUED] nebivolol  (BYSTOLIC ) 10 MG tablet Take 1 tablet (10 mg total) by mouth daily.   ezetimibe  (ZETIA ) 10 MG tablet Take 1 tablet (10 mg total) by mouth daily.   pregabalin  (LYRICA ) 50 MG capsule Take 1 capsule (50 mg total) by mouth daily for 14 days, THEN 1 capsule (50 mg total) 2 (two) times daily for 14 days, THEN 1 capsule (50 mg total) 3 (three) times daily for 14 days.   No facility-administered encounter medications on file as of 02/04/2019.      Objective:   Vitals:   02/04/19 0841  BP: 138/78  Pulse: 68  Temp: 97.9 F (36.6 C)  Height: 5' 1 (1.549 m)  Weight: 184 lb (83.5 kg)  SpO2: 96%  TempSrc: Temporal  BMI (Calculated): 34.78  Physical exam documentation is limited by delayed entry of information.
